# Patient Record
Sex: Male | Born: 1950
Health system: Southern US, Community
[De-identification: ages and names within clinical notes are randomized; demographics above are authoritative.]

## PROBLEM LIST (undated history)

## (undated) DIAGNOSIS — E785 Hyperlipidemia, unspecified: Secondary | ICD-10-CM

## (undated) DIAGNOSIS — I771 Stricture of artery: Secondary | ICD-10-CM

## (undated) DIAGNOSIS — T7840XA Allergy, unspecified, initial encounter: Secondary | ICD-10-CM

## (undated) DIAGNOSIS — I739 Peripheral vascular disease, unspecified: Secondary | ICD-10-CM

## (undated) DIAGNOSIS — M1990 Unspecified osteoarthritis, unspecified site: Secondary | ICD-10-CM

## (undated) DIAGNOSIS — I779 Disorder of arteries and arterioles, unspecified: Secondary | ICD-10-CM

## (undated) DIAGNOSIS — Z87891 Personal history of nicotine dependence: Secondary | ICD-10-CM

## (undated) DIAGNOSIS — I10 Essential (primary) hypertension: Secondary | ICD-10-CM

## (undated) HISTORY — DX: Allergy, unspecified, initial encounter: T78.40XA

## (undated) HISTORY — DX: Hyperlipidemia, unspecified: E78.5

## (undated) HISTORY — DX: Stricture of artery: I77.1

## (undated) HISTORY — PX: WISDOM TOOTH EXTRACTION: SHX21

## (undated) HISTORY — PX: JOINT REPLACEMENT: SHX530

## (undated) HISTORY — DX: Unspecified osteoarthritis, unspecified site: M19.90

## (undated) HISTORY — DX: Personal history of nicotine dependence: Z87.891

## (undated) HISTORY — DX: Essential (primary) hypertension: I10

## (undated) HISTORY — DX: Disorder of arteries and arterioles, unspecified: I77.9

## (undated) HISTORY — DX: Peripheral vascular disease, unspecified: I73.9

---

## 2005-10-21 HISTORY — PX: TOTAL HIP ARTHROPLASTY: SHX124

## 2005-11-07 ENCOUNTER — Inpatient Hospital Stay (HOSPITAL_COMMUNITY): Admission: RE | Admit: 2005-11-07 | Discharge: 2005-11-10 | Payer: Self-pay | Admitting: Orthopedic Surgery

## 2007-04-24 DIAGNOSIS — Z5189 Encounter for other specified aftercare: Secondary | ICD-10-CM

## 2007-04-24 HISTORY — DX: Encounter for other specified aftercare: Z51.89

## 2008-04-23 DIAGNOSIS — I10 Essential (primary) hypertension: Secondary | ICD-10-CM

## 2008-04-23 HISTORY — DX: Essential (primary) hypertension: I10

## 2013-03-11 ENCOUNTER — Encounter (INDEPENDENT_AMBULATORY_CARE_PROVIDER_SITE_OTHER): Payer: Self-pay

## 2013-03-11 ENCOUNTER — Ambulatory Visit (INDEPENDENT_AMBULATORY_CARE_PROVIDER_SITE_OTHER): Payer: Managed Care, Other (non HMO) | Admitting: Family Medicine

## 2013-03-11 VITALS — BP 99/68 | HR 80 | Temp 98.0°F | Ht 70.0 in | Wt 190.0 lb

## 2013-03-11 DIAGNOSIS — Z0000 Encounter for general adult medical examination without abnormal findings: Secondary | ICD-10-CM

## 2013-03-11 LAB — POCT CBC
Granulocyte percent: 59.1 %G (ref 37–80)
HCT, POC: 53.4 % (ref 43.5–53.7)
Lymph, poc: 2.2 (ref 0.6–3.4)
MPV: 8.2 fL (ref 0–99.8)
POC LYMPH PERCENT: 38.4 %L (ref 10–50)
Platelet Count, POC: 268 10*3/uL (ref 142–424)
RDW, POC: 13.3 %

## 2013-03-12 LAB — TSH: TSH: 1.05 u[IU]/mL (ref 0.450–4.500)

## 2013-03-12 LAB — CMP14+EGFR
ALT: 15 IU/L (ref 0–44)
Alkaline Phosphatase: 84 IU/L (ref 39–117)
BUN/Creatinine Ratio: 13 (ref 10–22)
BUN: 11 mg/dL (ref 8–27)
CO2: 28 mmol/L (ref 18–29)
Calcium: 10.2 mg/dL (ref 8.6–10.2)
Creatinine, Ser: 0.88 mg/dL (ref 0.76–1.27)
Glucose: 100 mg/dL — ABNORMAL HIGH (ref 65–99)
Total Bilirubin: 0.5 mg/dL (ref 0.0–1.2)

## 2013-03-12 LAB — LIPID PANEL
Chol/HDL Ratio: 5.8 ratio units — ABNORMAL HIGH (ref 0.0–5.0)
Cholesterol, Total: 238 mg/dL — ABNORMAL HIGH (ref 100–199)
HDL: 41 mg/dL (ref >39)
Triglycerides: 190 mg/dL — ABNORMAL HIGH (ref 0–149)
VLDL Cholesterol Cal: 38 mg/dL (ref 5–40)

## 2013-03-12 LAB — PSA, TOTAL AND FREE
PSA, Free Pct: 26.4 %
PSA: 1.1 ng/mL (ref 0.0–4.0)

## 2013-03-12 NOTE — Progress Notes (Signed)
  Subjective:    Patient ID: Jesus Klein, male    DOB: 1950-08-13, 62 y.o.   MRN: [id number]  HPI  This 62 y.o. male presents for evaluation of CPE and follow up on his hypertension. He is taking diovan 160mg  po qd.  His bp has been in the 90's.  Review of Systems    No chest pain, SOB, HA, dizziness, vision change, N/V, diarrhea, constipation, dysuria, urinary urgency or frequency, myalgias, arthralgias or rash.  Objective:   Physical Exam Vital signs noted  Well developed well nourished male.  HEENT - Head atraumatic Normocephalic                Eyes - PERRLA, Conjuctiva - clear Sclera- Clear EOMI                Ears - EAC's Wnl TM's Wnl Gross Hearing WNL                Nose - Nares patent                 Throat - oropharanx wnl Respiratory - Lungs CTA bilateral Cardiac - RRR S1 and S2 without murmur GI - Abdomen soft Nontender and bowel sounds active x 4 Extremities - No edema. Neuro - Grossly intact.       Assessment & Plan:  Routine general medical examination at a health care facility - Plan: CMP14+EGFR, TSH, Lipid panel, Vit D  25 hydroxy (rtn osteoporosis monitoring), PSA, total and free, POCT CBC  HTN - DC diovan and start taking lisinopril 5mg  po qd #90/2rf  Deatra Canter FNP

## 2013-03-16 ENCOUNTER — Other Ambulatory Visit: Payer: Self-pay | Admitting: Family Medicine

## 2013-03-25 ENCOUNTER — Telehealth: Payer: Self-pay | Admitting: *Deleted

## 2013-03-25 NOTE — Telephone Encounter (Signed)
Lm to call back

## 2013-04-02 NOTE — Telephone Encounter (Signed)
Pt aware of labs  

## 2013-04-02 NOTE — Telephone Encounter (Signed)
Lm to call back-jhb

## 2013-11-16 ENCOUNTER — Other Ambulatory Visit: Payer: Self-pay | Admitting: Family Medicine

## 2014-01-19 ENCOUNTER — Encounter: Payer: Self-pay | Admitting: Family Medicine

## 2014-02-02 ENCOUNTER — Encounter: Payer: Self-pay | Admitting: Physician Assistant

## 2014-02-02 ENCOUNTER — Ambulatory Visit (INDEPENDENT_AMBULATORY_CARE_PROVIDER_SITE_OTHER): Payer: Managed Care, Other (non HMO) | Admitting: Physician Assistant

## 2014-02-02 VITALS — BP 160/78 | HR 76 | Temp 97.6°F | Resp 18 | Ht 70.0 in | Wt 186.0 lb

## 2014-02-02 DIAGNOSIS — Z23 Encounter for immunization: Secondary | ICD-10-CM

## 2014-02-02 DIAGNOSIS — E559 Vitamin D deficiency, unspecified: Secondary | ICD-10-CM | POA: Insufficient documentation

## 2014-02-02 DIAGNOSIS — I10 Essential (primary) hypertension: Secondary | ICD-10-CM

## 2014-02-02 DIAGNOSIS — R0989 Other specified symptoms and signs involving the circulatory and respiratory systems: Secondary | ICD-10-CM | POA: Insufficient documentation

## 2014-02-02 DIAGNOSIS — E785 Hyperlipidemia, unspecified: Secondary | ICD-10-CM

## 2014-02-02 DIAGNOSIS — I739 Peripheral vascular disease, unspecified: Secondary | ICD-10-CM

## 2014-02-02 DIAGNOSIS — Z1212 Encounter for screening for malignant neoplasm of rectum: Secondary | ICD-10-CM

## 2014-02-02 DIAGNOSIS — Z1211 Encounter for screening for malignant neoplasm of colon: Secondary | ICD-10-CM

## 2014-02-02 LAB — COMPLETE METABOLIC PANEL WITHOUT GFR
ALT: 17 U/L (ref 0–53)
AST: 18 U/L (ref 0–37)
Albumin: 4.7 g/dL (ref 3.5–5.2)
Alkaline Phosphatase: 82 U/L (ref 39–117)
BUN: 14 mg/dL (ref 6–23)
CO2: 29 meq/L (ref 19–32)
Calcium: 10.2 mg/dL (ref 8.4–10.5)
Chloride: 103 meq/L (ref 96–112)
Creat: 0.84 mg/dL (ref 0.50–1.35)
GFR, Est African American: >89 mL/min
GFR, Est Non African American: >89 mL/min
Glucose, Bld: 85 mg/dL (ref 70–99)
Potassium: 4.6 meq/L (ref 3.5–5.3)
Sodium: 139 meq/L (ref 135–145)
Total Bilirubin: 0.6 mg/dL (ref 0.2–1.2)
Total Protein: 7.2 g/dL (ref 6.0–8.3)

## 2014-02-02 LAB — LIPID PANEL
Cholesterol: 209 mg/dL — ABNORMAL HIGH (ref 0–200)
HDL: 41 mg/dL
LDL Cholesterol: 110 mg/dL — ABNORMAL HIGH (ref 0–99)
Total CHOL/HDL Ratio: 5.1 ratio
Triglycerides: 291 mg/dL — ABNORMAL HIGH
VLDL: 58 mg/dL — ABNORMAL HIGH (ref 0–40)

## 2014-02-02 MED ORDER — LISINOPRIL 10 MG PO TABS: 10.0000 mg | ORAL_TABLET | Freq: Every day | ORAL | Status: DC

## 2014-02-02 NOTE — Progress Notes (Signed)
Patient ID: Jesus Klein MRN: [id number], DOB: November 21, 1950 63 y.o. Date of Encounter: 02/02/2014, 10:19 AM    Chief Complaint: Physical (CPE)  HPI: 63 y.o. y/o male here for CPE.  He presents as a new patient to establish care with this office.  He reports that he did live in [place] but recently moved to [place]. He did go to [place] but is changing to this office secondary to location.  The only complaint that he reports today is the following: He states that when he walks a distance,  his legs start to feel weak and feel like they are cramping. Says he starts to feel a cramping sensation in his low back and the backs of his thighs in the backs of his calves. When he stops and rests, the symptoms resolve but then with further walking, the symptoms quickly recur. He says that the symptoms are equal on both sides. Says that these symptoms have been persistent for the past 6 months. Says it only occurs when he does constant walking. Says it has gotten to where he can only walk on his treadmill for about 2 minutes before he starts having these problems.  Says the only problems/ evaluation  he has ever had with low back pain/sciatica was in 2010. Says at that time he saw [facility] and they checked on his hip to make sure that his symptoms were not secondary to his hip replacement surgery and that they determined that that episode was "sciatica."  Reviewed in Epic today that there are no x-rays of his lumbar spine or other x-rays there.  Note: Patient works 3 PM to 3 AM.  Says he usually goes home and goes to sleep and then is awake by 10:00 a.m.   He reports no other problems or concerns today. He initially came in today thinking he was going to do a " complete physical."  However I discussed with him that it appears that he was coded as a complete physical at his visit 03/11/2013. Discussed that insurance usually will only pay for 1 complete  physical exam per year. He voices understanding and agrees that I will discuss this is her regular visit. He also discussed the fact that he did have a panel of lab work 03/11/13. Discussed that usually insurance will only pay for screening labs once per year. Therefore today we will only repeat the labs that are needed/indicated to repeat. Patient voices understanding and agrees.   Review of Systems: Consitutional: No fever, chills, fatigue, night sweats, lymphadenopathy, or weight changes. Eyes: No visual changes, eye redness, or discharge. ENT/Mouth: Ears: No otalgia, tinnitus, hearing loss, discharge. Nose: No congestion, rhinorrhea, sinus pain, or epistaxis. Throat: No sore throat, post nasal drip, or teeth pain. Cardiovascular: No CP, palpitations, diaphoresis, DOE, edema, orthopnea, PND. Respiratory: No cough, hemoptysis, SOB, or wheezing. Gastrointestinal: No anorexia, dysphagia, reflux, pain, nausea, vomiting, hematemesis, diarrhea, constipation, BRBPR, or melena. Genitourinary: No dysuria, frequency, urgency, hematuria, incontinence, nocturia, decreased urinary stream, discharge, impotence, or testicular pain/masses. Musculoskeletal: No decreased ROM, myalgias, stiffness, joint swelling. See HPI for pertinent positives.  Skin: No rash, erythema, lesion changes, pain, warmth, jaundice, or pruritis. Neurological: No headache, dizziness, syncope, seizures, tremors, memory loss, coordination problems, or paresthesias. Psychological: No anxiety, depression, hallucinations, SI/HI. Endocrine: No fatigue, polydipsia, polyphagia, polyuria, or known diabetes. All other systems were reviewed and are otherwise negative.  Past Medical History  Diagnosis Date  . Hypertension 2010  . Arthritis 2005  Past Surgical History  Procedure Laterality Date  . Joint replacement Right 10/2005    total hip    Home Meds:  Outpatient Prescriptions Prior to Visit  Medication Sig Dispense Refill  .  cetirizine (ZYRTEC) 10 MG tablet Take 10 mg by mouth daily.      . cholecalciferol (VITAMIN D) 1000 UNITS tablet Take 1,000 Units by mouth daily.      . Coenzyme Q10 (EQL COQ10) 300 MG CAPS Take 1 capsule by mouth daily.      Marland Kitchen ibuprofen (ADVIL,MOTRIN) 200 MG tablet Take 200 mg by mouth as needed.      . Multiple Vitamin (MULTIVITAMIN) tablet Take 1 tablet by mouth daily.      . naproxen (NAPROSYN) 250 MG tablet Take by mouth as needed.      . Omega-3 Fatty Acids (FISH OIL PO) Take 1,400 mg by mouth daily. Takes two a day      . lisinopril (PRINIVIL,ZESTRIL) 5 MG tablet TAKE 1 TABLET DAILY  90 tablet  0   No facility-administered medications prior to visit.    Allergies: No Known Allergies  History   Social History  . Marital Status: Married    Spouse Name: N/A    Number of Children: N/A  . Years of Education: N/A   Occupational History  . Not on file.   Social History Main Topics  . Smoking status: Former Smoker    Types: Cigarettes    Quit date: 01/19/2009  . Smokeless tobacco: Never Used  . Alcohol Use: 1.2 - 1.8 oz/week    2-3 Cans of beer per week  . Drug Use: No  . Sexual Activity: Yes    Birth Control/ Protection: None   Other Topics Concern  . 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 He may occasionally lift 20 pounds but most is done by machine.    Mix of standing, sitting, moving around.      Used to walk a lot and do a lot outside. Got a treadmill to use during cold weather.   Married.   2 daughters- grown.          Family History  Problem Relation Age of Onset  . Hyperlipidemia Mother   . Arthritis Father   . COPD Father   . Diabetes Father   . Hypertension Father     Physical Exam: Blood pressure 160/78, pulse 76, temperature 97.6 F (36.4 C), temperature source Oral, resp. rate 18, height 5\' 10"  (1.778 m), weight 186 lb (84.369 kg), SpO2 98.00%.    General: Well developed, well nourished,White Male. Appears in no acute distress. Neck: Supple. Trachea midline. No thyromegaly. Full ROM. No lymphadenopathy. He has bilateral carotid bruits left greater than right. Lungs: Clear to auscultation bilaterally without wheezes, rales, or rhonchi. Breathing is of normal effort and unlabored. Cardiovascular: RRR with S1 S2. No murmurs, rubs, or gallops.  VASCULAR:  He has bilateral carotid bruits, Left > Right.  He has a right femoral bruit but no left femoral bruit. He has very soft right renal artery bruit but no left renal artery bruit.  He has no palpable dorsalis pedis or posterior tibial pulses bilaterally. There are no wounds or open sores on his feet. Color is normal.  Abdomen: Soft, non-tender, non-distended with normoactive bowel sounds. No hepatosplenomegaly or masses. No rebound/guarding. No CVA tenderness. No hernias. Musculoskeletal: Full range  of motion and 5/5 strength throughout. Skin: Warm and moist without erythema, ecchymosis, wounds, or rash. Neuro: A+Ox3. CN II-XII grossly intact. Moves all extremities spontaneously. Full sensation throughout. Normal gait.  Psych:  Responds to questions appropriately with a normal affect.   Assessment/Plan:  63 y.o. y/o white male here for Routine Office Visit to Establish Care.  1. Essential hypertension At the end of the visit when I was reviewing his AVS, I discussed with patient that I was increasing his blood pressure medicine.  He then asked what blood pressure reading we got and I told him 160/78.  He says that he checks his blood pressure twice a day and gets about 135/75. I explained that we were only increasing it a small amount and that he is on a very tiny dose right now and will still be on a small dose. He is agreeable to go ahead and increase from lisinopril 5 mg a 10 mg. Told him to followup if he develops any lightheadedness.  - COMPLETE METABOLIC PANEL WITH GFR -  lisinopril (PRINIVIL,ZESTRIL) 10 MG tablet; Take 1 tablet (10 mg total) by mouth daily.  Dispense: 90 tablet; Refill: 3  2. I hear a soft bruit at the right renal artery. Given that I am hearing bruits elsewhere, will go ahead and obtain renal artery ultrasound. - US Renal Artery Stenosis; Future  3. Vitamin D deficiency 02/2013 vitamin D level was low at 23.9. Patient states that since that time he has been taking over-the-counter vitamin D 1000 units daily as directed. Will recheck a vitamin D level now on supplement. - Vit D  25 hydroxy (rtn osteoporosis monitoring)  4. Hyperlipidemia 03/11/2013 he had lipid panel which showed triglycerides 190,    HDL 41,     LDL 159. Patient states that since then he has increased his fruits and vegetables and did increase his Fish Oil from taking 1 per day up to 2 per day. Will recheck lipid panel now. - COMPLETE METABOLIC PANEL WITH GFR - Lipid panel  5. Claudication of lower extremity - Ankle brachial index; Future - Lower Extremity Arterial Doppler Bilateral; Future  6. Bilateral carotid bruits - Carotid duplex; Future  7. Preventive Care  A. Screening Labs: 03/11/13 he had:   (these are in Epic)  CBC--Normal CMET---Normal TSH--Normal PSA----Normal Vitamin D--low as above FLP--as above  B. Screening For Prostate Cancer: PSA--normal 03/11/2013  C. Screening For Colorectal Cancer:  Patient reports he has had one colonoscopy. Says that was at age 45. He states that this was performed at [facility] in [place] but does not recall the name of the Doctor. I discussed that even if that colonoscopy was normal, that he should have repeat every 10 years. He is agreeable for me to schedule followup.  - Ambulatory referral to Gastroenterology  D. Immunizations: Influenza Vaccine: Pt is agreeable to get the flu vaccine today. Need for prophylactic vaccination and inoculation against influenza - Flu Vaccine QUAD 36+ mos PF IM (Fluarix  Quad PF)  Tetanus------ This is documented in Epic. He received Tdap 09/02/2010 Pneumococcal--- he has no indication to need pneumonia vaccine until age 63. Zostavax-----we'll discuss this as is next office visit.  He is to schedule followup office visit in about 3 weeks.    Signed:   [address] [place], [place]  02/02/2014 10:19 AM

## 2014-02-03 ENCOUNTER — Telehealth (HOSPITAL_COMMUNITY): Payer: Self-pay | Admitting: *Deleted

## 2014-02-03 ENCOUNTER — Telehealth: Payer: Self-pay | Admitting: *Deleted

## 2014-02-03 LAB — VITAMIN D 25 HYDROXY (VIT D DEFICIENCY, FRACTURES): VIT D 25 HYDROXY: 43 ng/mL (ref 30–89)

## 2014-02-03 NOTE — Telephone Encounter (Signed)
LMTRC to pt, pt has appt at [facility] on Oct 21 at 10:45, needs to arrive 7mins early for check in for US renal artery, pt is to have no SOLIDS starting at 12pm the day before, and then NPO 12 midnight before his scan

## 2014-02-04 NOTE — Telephone Encounter (Signed)
Pt called back and is aware of appt

## 2014-02-09 ENCOUNTER — Other Ambulatory Visit: Payer: Self-pay | Admitting: Physician Assistant

## 2014-02-09 DIAGNOSIS — I10 Essential (primary) hypertension: Secondary | ICD-10-CM

## 2014-02-10 ENCOUNTER — Other Ambulatory Visit: Payer: Self-pay

## 2014-02-12 ENCOUNTER — Ambulatory Visit (HOSPITAL_COMMUNITY)
Admission: RE | Admit: 2014-02-12 | Discharge: 2014-02-12 | Disposition: A | Discharge status: Home / Self Care | Payer: Managed Care, Other (non HMO) | Source: Ambulatory Visit | Attending: Cardiovascular Disease | Admitting: Cardiovascular Disease

## 2014-02-12 ENCOUNTER — Ambulatory Visit (INDEPENDENT_AMBULATORY_CARE_PROVIDER_SITE_OTHER): Payer: Managed Care, Other (non HMO) | Admitting: Cardiovascular Disease

## 2014-02-12 ENCOUNTER — Inpatient Hospital Stay (HOSPITAL_COMMUNITY): Admission: RE | Admit: 2014-02-12 | Payer: Self-pay | Source: Ambulatory Visit

## 2014-02-12 ENCOUNTER — Encounter: Payer: Self-pay | Admitting: Cardiovascular Disease

## 2014-02-12 ENCOUNTER — Ambulatory Visit (HOSPITAL_BASED_OUTPATIENT_CLINIC_OR_DEPARTMENT_OTHER)
Admission: RE | Admit: 2014-02-12 | Discharge: 2014-02-12 | Disposition: A | Discharge status: Home / Self Care | Payer: Managed Care, Other (non HMO) | Source: Ambulatory Visit | Attending: Physician Assistant | Admitting: Physician Assistant

## 2014-02-12 VITALS — BP 111/74 | HR 86 | Ht 70.0 in | Wt 186.2 lb

## 2014-02-12 DIAGNOSIS — I739 Peripheral vascular disease, unspecified: Secondary | ICD-10-CM

## 2014-02-12 DIAGNOSIS — R0989 Other specified symptoms and signs involving the circulatory and respiratory systems: Secondary | ICD-10-CM

## 2014-02-12 DIAGNOSIS — Z87891 Personal history of nicotine dependence: Secondary | ICD-10-CM

## 2014-02-12 DIAGNOSIS — R5383 Other fatigue: Secondary | ICD-10-CM

## 2014-02-12 DIAGNOSIS — Z79899 Other long term (current) drug therapy: Secondary | ICD-10-CM

## 2014-02-12 DIAGNOSIS — D689 Coagulation defect, unspecified: Secondary | ICD-10-CM

## 2014-02-12 MED ORDER — ATORVASTATIN CALCIUM 40 MG PO TABS: 40.0000 mg | ORAL_TABLET | Freq: Every day | ORAL | Status: DC

## 2014-02-12 MED ORDER — ASPIRIN EC 81 MG PO TBEC: 81.0000 mg | DELAYED_RELEASE_TABLET | Freq: Every day | ORAL | Status: DC

## 2014-02-12 NOTE — Assessment & Plan Note (Signed)
Recent lower shimmy Dopplers for further office today revealed an occluded left common iliac with high-grade right iliac artery stenosis. Based on this, I decided to proceed with angiography and potential percutaneous intervention. Procedure benefits have been thoroughly explained to the patient including alternatives and he agrees to proceed.

## 2014-02-12 NOTE — Progress Notes (Signed)
02/12/2014 Jesus Klein   01-25-1951  [id number]  Primary Physician Karis Juba, PA-C Primary Cardiologist: Lorretta Harp MD Renae Gloss   HPI:  [name] is a 63 year old thin-appearing Caucasian male father of 2 children, grandfather and 2 grandchildren referred by Karis Juba PA-C for peripheral vascular evaluation. He had Dopplers performed in the office today that showed high-grade ostial carotid, ostial/subclavian artery stenosis as well as an occluded left common iliac and high-grade right iliac stenosis. His cardiovascular risk factor profile is notable for a 30-pack-year history of tobacco abuse having stopped smoking 2 years ago as well as 2 hypertension. There is no family history of heart disease. He does have mild hyperlipidemia. He has never had a heart attack or stroke. Denies chest pain or shortness of breath. He did have a right total hip replacement performed by Dr. Sharol Given in 2007. He noticed lifestyle limiting claudication 6 months ago right greater than left.   Current Outpatient Prescriptions  Medication Sig Dispense Refill  . cetirizine (ZYRTEC) 10 MG tablet Take 10 mg by mouth daily.      . cholecalciferol (VITAMIN D) 1000 UNITS tablet Take 1,000 Units by mouth daily.      . Coenzyme Q10 (EQL COQ10) 300 MG CAPS Take 1 capsule by mouth daily.      Marland Kitchen ibuprofen (ADVIL,MOTRIN) 200 MG tablet Take 200 mg by mouth as needed.      Marland Kitchen lisinopril (PRINIVIL,ZESTRIL) 10 MG tablet Take 1 tablet (10 mg total) by mouth daily.  90 tablet  3  . Multiple Vitamin (MULTIVITAMIN) tablet Take 1 tablet by mouth daily.      . naproxen (NAPROSYN) 250 MG tablet Take by mouth as needed.      . Omega-3 Fatty Acids (FISH OIL PO) Take 1,400 mg by mouth daily. Takes two a day       No current facility-administered medications for this visit.    No Known Allergies  History   Social History  . Marital Status: Married    Spouse Name: N/A    Number of Children: N/A  .  Years of Education: N/A   Occupational History  . Not on file.   Social History Main Topics  . Smoking status: Former Smoker    Types: Cigarettes    Quit date: 01/19/2009  . Smokeless tobacco: Never Used  . Alcohol Use: 1.2 - 1.8 oz/week    2-3 Cans of beer per week  . Drug Use: No  . Sexual Activity: Yes    Birth Control/ Protection: None   Other Topics Concern  . 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 He may occasionally lift 20 pounds but most is done by machine.    Mix of standing, sitting, moving around.      Used to walk a lot and do a lot outside. Got a treadmill to use during cold weather.   Married.   2 daughters- grown.           Review of Systems: General: negative for chills, fever, night sweats or weight changes.  Cardiovascular: negative for chest pain, dyspnea on exertion, edema, orthopnea, palpitations, paroxysmal nocturnal dyspnea or shortness of breath Dermatological: negative for rash Respiratory: negative for cough or wheezing Urologic: negative for hematuria Abdominal: negative for nausea, vomiting, diarrhea, bright red blood per rectum, melena, or hematemesis Neurologic: negative for visual  changes, syncope, or dizziness All other systems reviewed and are otherwise negative except as noted above.    Blood pressure 111/74, pulse 86, height 5\' 10"  (1.778 m), weight 186 lb 3.2 oz (84.46 kg).  General appearance: alert and no distress Neck: no adenopathy, no JVD, supple, symmetrical, trachea midline, thyroid not enlarged, symmetric, no tenderness/mass/nodules and bilateral carotid bruits, without left subclavian bruit Lungs: clear to auscultation bilaterally Heart: regular rate and rhythm, S1, S2 normal, no murmur, click, rub or gallop Extremities: extremities normal, atraumatic, no cyanosis or edema and absent left common femoral pulse,  EKG not  performed today  ASSESSMENT AND PLAN:   Hyperlipidemia Recent lipid profile performed 02/02/14 revealed Flagyl 209 with an LDL of 110 and HDL of 41. Based on the diffuse nature of his vascular disease and then to begin atorvastatin 40 mg a day.  Essential hypertension Controlled on current medications  Claudication of lower extremity Recent lower shimmy Dopplers for further office today revealed an occluded left common iliac with high-grade right iliac artery stenosis. Based on this, I decided to proceed with angiography and potential percutaneous intervention. Procedure benefits have been thoroughly explained to the patient including alternatives and he agrees to proceed.      Lorretta Harp MD FACP,FACC,FAHA, [facility] 02/12/2014 4:04 PM

## 2014-02-12 NOTE — Patient Instructions (Signed)
Dr. Berry has ordered a peripheral angiogram to be done at [facility].  This procedure is going to look at the bloodflow in your lower extremities.  If Dr. Berry is able to open up the arteries, you will have to spend one night in the hospital.  If he is not able to open the arteries, you will be able to go home that same day.    After the procedure, you will not be allowed to drive for 3 days or push, pull, or lift anything greater than 10 lbs for one week.    You will be required to have the following tests prior to the procedure:  1. Blood work-the blood work can be done no more than 7 days prior to the procedure.  It can be done at any [facility] lab.  There is one downstairs on the first floor of this building and one in the [facility] ([address] [facility])  2. Chest Xray-the chest xray order has already been placed at the [facility].     *REPS n/a    

## 2014-02-12 NOTE — Progress Notes (Signed)
Carotid Duplex Completed. Jimy Gates, BS, RDMS, RVT  

## 2014-02-12 NOTE — Assessment & Plan Note (Signed)
Recent lipid profile performed 02/02/14 revealed Flagyl 209 with an LDL of 110 and HDL of 41. Based on the diffuse nature of his vascular disease and then to begin atorvastatin 40 mg a day.

## 2014-02-12 NOTE — Progress Notes (Signed)
Arterial Duplex Lower Ext. Completed. Shrika Milos, BS, RDMS, RVT  

## 2014-02-12 NOTE — Assessment & Plan Note (Signed)
Controlled on current medications 

## 2014-02-18 ENCOUNTER — Ambulatory Visit
Admission: RE | Admit: 2014-02-18 | Discharge: 2014-02-18 | Disposition: A | Discharge status: Home / Self Care | Payer: Managed Care, Other (non HMO) | Source: Ambulatory Visit | Attending: Physician Assistant | Admitting: Physician Assistant

## 2014-02-18 DIAGNOSIS — I10 Essential (primary) hypertension: Secondary | ICD-10-CM

## 2014-02-24 ENCOUNTER — Ambulatory Visit (INDEPENDENT_AMBULATORY_CARE_PROVIDER_SITE_OTHER): Payer: Managed Care, Other (non HMO) | Admitting: Physician Assistant

## 2014-02-24 ENCOUNTER — Encounter: Payer: Self-pay | Admitting: Physician Assistant

## 2014-02-24 VITALS — BP 132/76 | HR 72 | Temp 98.2°F | Resp 18 | Wt 184.0 lb

## 2014-02-24 DIAGNOSIS — Z1211 Encounter for screening for malignant neoplasm of colon: Secondary | ICD-10-CM

## 2014-02-24 DIAGNOSIS — R0989 Other specified symptoms and signs involving the circulatory and respiratory systems: Secondary | ICD-10-CM

## 2014-02-24 DIAGNOSIS — E785 Hyperlipidemia, unspecified: Secondary | ICD-10-CM

## 2014-02-24 DIAGNOSIS — E559 Vitamin D deficiency, unspecified: Secondary | ICD-10-CM

## 2014-02-24 DIAGNOSIS — Z1212 Encounter for screening for malignant neoplasm of rectum: Secondary | ICD-10-CM

## 2014-02-24 DIAGNOSIS — I739 Peripheral vascular disease, unspecified: Secondary | ICD-10-CM

## 2014-02-24 DIAGNOSIS — I10 Essential (primary) hypertension: Secondary | ICD-10-CM

## 2014-02-24 NOTE — Progress Notes (Signed)
Patient ID: Jesus Klein MRN: [id number], DOB: 1950/10/18 63 y.o. Date of Encounter: 02/24/2014, 9:51 AM    Chief Complaint: F/U HTN, PVD  HPI: 63 y.o. y/o male here for follow-up of above.  He recently saw me on 02/02/2014 as a new patient to establish care with this office.  He reported that he did live in [place] but recently moved to [place]. He did go to [place] but was changing to this office secondary to location.  The only complaint that he reported that day was the following: " He states that when he walks a distance,  his legs start to feel weak and feel like they are cramping. Says he starts to feel a cramping sensation in his low back and the backs of his thighs in the backs of his calves. When he stops and rests, the symptoms resolve but then with further walking, the symptoms quickly recur. He says that the symptoms are equal on both sides. Says that these symptoms have been persistent for the past 6 months. Says it only occurs when he does constant walking. Says it has gotten to where he can only walk on his treadmill for about 2 minutes before he starts having these problems.  Says the only problems/ evaluation  he has ever had with low back pain/sciatica was in 2010. Says at that time he saw [facility] and they checked on his hip to make sure that his symptoms were not secondary to his hip replacement surgery and that they determined that that episode was "sciatica." '  Reviewed in Epic today that there are no x-rays of his lumbar spine or other x-rays there.  Note: Patient works 3 PM to 3 AM.  Says he usually goes home and goes to sleep and then is awake by 10:00 a.m.   He reported no other problems or concerns that day. He initially came in today thinking he was going to do a " complete physical."  However I discussed with him that it appears that he was coded as a complete physical at his visit 03/11/2013. Discussed that  insurance usually will only pay for 1 complete physical exam per year. He voices understanding and agrees that I will code this as a routine office visit. He also discussed the fact that he did have a panel of lab work 03/11/13. Discussed that usually insurance will only pay for screening labs once per year. Therefore today we will only repeat the labs that are needed/indicated to repeat. Patient voices understanding and agrees.  At that visit I did go ahead and repeat any lab work that was indicated given his current medical problems. At that visit also evaluated him for lower extremity claudication and found multiple bruits on exam. Ordered appropriate arterial Dopplers. I also updated preventative care at that visit. See Assessment/Plan section  at the end of this note for the details of this.   Review of Systems: Consitutional: No fever, chills, fatigue, night sweats, lymphadenopathy, or weight changes. Eyes: No visual changes, eye redness, or discharge. ENT/Mouth: Ears: No otalgia, tinnitus, hearing loss, discharge. Nose: No congestion, rhinorrhea, sinus pain, or epistaxis. Throat: No sore throat, post nasal drip, or teeth pain. Cardiovascular: No CP, palpitations, diaphoresis, DOE, edema, orthopnea, PND. Respiratory: No cough, hemoptysis, SOB, or wheezing. Gastrointestinal: No anorexia, dysphagia, reflux, pain, nausea, vomiting, hematemesis, diarrhea, constipation, BRBPR, or melena. Genitourinary: No dysuria, frequency, urgency, hematuria, incontinence, nocturia, decreased urinary stream, discharge, impotence, or testicular pain/masses. Musculoskeletal: No  decreased ROM, myalgias, stiffness, joint swelling. See HPI for pertinent positives.  Skin: No rash, erythema, lesion changes, pain, warmth, jaundice, or pruritis. Neurological: No headache, dizziness, syncope, seizures, tremors, memory loss, coordination problems, or paresthesias. Psychological: No anxiety, depression, hallucinations,  SI/HI. Endocrine: No fatigue, polydipsia, polyphagia, polyuria, or known diabetes. All other systems were reviewed and are otherwise negative.  Past Medical History  Diagnosis Date  . Hypertension 2010  . Arthritis 2005  . Hyperlipidemia   . Peripheral arterial disease      Past Surgical History  Procedure Laterality Date  . Joint replacement Right 10/2005    total hip    Home Meds:  Outpatient Prescriptions Prior to Visit  Medication Sig Dispense Refill  . aspirin EC 81 MG tablet Take 1 tablet (81 mg total) by mouth daily. 90 tablet 3  . atorvastatin (LIPITOR) 40 MG tablet Take 1 tablet (40 mg total) by mouth daily. 30 tablet 6  . cetirizine (ZYRTEC) 10 MG tablet Take 10 mg by mouth daily.    . cholecalciferol (VITAMIN D) 1000 UNITS tablet Take 1,000 Units by mouth daily.    . Coenzyme Q10 (EQL COQ10) 300 MG CAPS Take 1 capsule by mouth daily.    Marland Kitchen ibuprofen (ADVIL,MOTRIN) 200 MG tablet Take 200 mg by mouth as needed.    Marland Kitchen lisinopril (PRINIVIL,ZESTRIL) 10 MG tablet Take 1 tablet (10 mg total) by mouth daily. 90 tablet 3  . Multiple Vitamin (MULTIVITAMIN) tablet Take 1 tablet by mouth daily.    . naproxen sodium (ALEVE) 220 MG tablet Take 220 mg by mouth every 12 (twelve) hours.    . Omega-3 Fatty Acids (FISH OIL PO) Take 1,400 mg by mouth daily. Takes two a day    . naproxen (NAPROSYN) 250 MG tablet Take by mouth as needed.    . Omega-3 1000 MG CAPS Take 2,000 mg by mouth daily.     No facility-administered medications prior to visit.    Allergies: No Known Allergies  History   Social History  . Marital Status: Married    Spouse Name: N/A    Number of Children: N/A  . Years of Education: N/A   Occupational History  . Not on file.   Social History Main Topics  . Smoking status: Former Smoker    Types: Cigarettes    Quit date: 01/19/2009  . Smokeless tobacco: Never Used  . Alcohol Use: 1.2 - 1.8 oz/week    2-3 Cans of beer per week  . Drug Use: No  . Sexual  Activity: Yes    Birth Control/ Protection: None   Other Topics Concern  . 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 He may occasionally lift 20 pounds but most is done by machine.    Mix of standing, sitting, moving around.      Used to walk a lot and do a lot outside. Got a treadmill to use during cold weather.   Married.   2 daughters- grown.          Family History  Problem Relation Age of Onset  . Hyperlipidemia Mother   . Arthritis Father   . COPD Father   . Diabetes Father   . Hypertension Father     Physical Exam: Blood pressure 132/76, pulse 72, temperature 98.2 F (36.8 C), temperature source Oral, resp. rate 18, weight 184 lb (83.462 kg).  General:  Well developed, well nourished,White Male. Appears in no acute distress. Neck: Supple. Trachea midline. No thyromegaly. Full ROM. No lymphadenopathy. He has bilateral carotid bruits left greater than right. Lungs: Clear to auscultation bilaterally without wheezes, rales, or rhonchi. Breathing is of normal effort and unlabored. Cardiovascular: RRR with S1 S2. No murmurs, rubs, or gallops.  VASCULAR:  He has bilateral carotid bruits, Left > Right.  He has a right femoral bruit but no left femoral bruit. He has very soft right renal artery bruit but no left renal artery bruit.  He has no palpable dorsalis pedis or posterior tibial pulses bilaterally. There are no wounds or open sores on his feet. Color is normal.  Abdomen: Soft, non-tender, non-distended with normoactive bowel sounds. No hepatosplenomegaly or masses. No rebound/guarding. No CVA tenderness. No hernias. Musculoskeletal: Full range of motion and 5/5 strength throughout. Skin: Warm and moist without erythema, ecchymosis, wounds, or rash. Neuro: A+Ox3. CN II-XII grossly intact. Moves all extremities spontaneously. Full sensation throughout. Normal gait.  Psych:   Responds to questions appropriately with a normal affect.   Assessment/Plan:  63 y.o. y/o white male here for Routine Office Visit to Establish Care.  1. Essential hypertension At LOV increased from lisinopril 5 mg to 10 mg.  Today he reports that he is taking the 10 mg as directed. He is having no lightheadedness and no other adverse effects. Pressure is now at goal so we will will continue current dose. I will not repeat BMET today as Dr. Gwenlyn Found already has BMET ordered as part of pre-procedure labs. Patient states that he is going this Friday for these labs.  2. On exam 02/02/2014 I heard a soft bruit at the right renal artery. Given that I am hearing bruits elsewhere, I went ahead and obtained renal artery ultrasound. - US Renal Artery Stenosis; Future---this was performed 02/12/14 and showed no significant renal artery stenosis.  3. Vitamin D deficiency 02/2013 vitamin D level was low at 23.9. Patient states that since that time he has been taking over-the-counter vitamin D 1000 units daily as directed. Vitamin D level rechecked 02/02/14-- on supplement.  4. Hyperlipidemia 03/11/2013 he had lipid panel which showed triglycerides 190,    HDL 41,     LDL 159. At [place] with me 02/02/2014 Patient stated that since then he had increased his fruits and vegetables and did increase his Fish Oil from taking 1 per day up to 2 per day. Rechecked lipid panel at that OV. When patient saw Dr. Gwenlyn Found on 02/12/14 he did review that lipid panel and went ahead and started patient on Lipitor 40 mg.   5. Claudication of lower extremity - Ankle brachial index; Future - Lower Extremity Arterial Doppler Bilateral; Future  These tests were ordered 02/02/14. He did have these performed on 02/12/14. Patient states that he "was lucky" and it just so happened that Dr. Gwenlyn Found had an appointment available to see him immediately after he had the Dopplers performed that day. He was found to have an occluded left  common iliac and a high-grade right common iliac. Patient states that he is scheduled for PV angiogram with possible intervention next week with Dr. Gwenlyn Found.  6. Bilateral carotid bruits - Carotid duplex; Future---this was also performed on 02/12/14. This showed--left common carotid artery/ICA/ECA-- proximal occlusive versus high-grade stenosis. Right vertebral appear occluded Left subclavian artery--proximal high-grade stenosis  Patient states that he thinks that Dr. Estill Cotta he is planning to do the lower extremity angiogram this upcoming week. Patient states  that he is under the understanding that Dr. Gwenlyn Found is going to wait to address these areas of stenosis.  7. Preventive Care  A. Screening Labs: 03/11/13 he had:   (these are in Epic)  CBC--Normal CMET---Normal TSH--Normal PSA----Normal Vitamin D--low as above FLP--as above  B. Screening For Prostate Cancer: PSA--normal 03/11/2013  C. Screening For Colorectal Cancer:  Patient reports he has had one colonoscopy. Says that was at age 14. He states that this was performed at [facility] in [place] but does not recall the name of the Doctor. I discussed that even if that colonoscopy was normal, that he should have repeat every 10 years. He is agreeable for me to schedule followup.  - Ambulatory referral to Gastroenterology   D. Immunizations: Influenza Vaccine: Pt is agreeable to get the flu vaccine today. Need for prophylactic vaccination and inoculation against influenza - Flu Vaccine QUAD 36+ mos PF IM (Fluarix Quad PF)  Tetanus------ This is documented in Epic. He received Tdap 09/02/2010 Pneumococcal--- he has no indication to need pneumonia vaccine until age 35. Zostavax-----At OV 02/24/2014--I discusssed--he is to call his insurance to find out the cost. If he is agreeable to pay this cough and he will call us and we will send an order to the pharmacy.  He wait 6 months for routine follow-up office visit. Follow-up sooner  if needed.    Signed:   [address] [place], [place]  02/24/2014 9:51 AM

## 2014-02-26 ENCOUNTER — Ambulatory Visit
Admission: RE | Admit: 2014-02-26 | Discharge: 2014-02-26 | Disposition: A | Discharge status: Home / Self Care | Payer: Managed Care, Other (non HMO) | Source: Ambulatory Visit | Attending: Cardiovascular Disease | Admitting: Cardiovascular Disease

## 2014-02-26 DIAGNOSIS — Z87891 Personal history of nicotine dependence: Secondary | ICD-10-CM

## 2014-02-26 LAB — CBC
HCT: 47.4 % (ref 39.0–52.0)
Hemoglobin: 16.3 g/dL (ref 13.0–17.0)
MCH: 30.6 pg (ref 26.0–34.0)
MCHC: 34.4 g/dL (ref 30.0–36.0)
MCV: 88.9 fL (ref 78.0–100.0)
PLATELETS: 280 10*3/uL (ref 150–400)
RBC: 5.33 MIL/uL (ref 4.22–5.81)
RDW: 13.2 % (ref 11.5–15.5)
WBC: 5.8 10*3/uL (ref 4.0–10.5)

## 2014-02-26 LAB — BASIC METABOLIC PANEL
BUN: 13 mg/dL (ref 6–23)
CO2: 26 meq/L (ref 19–32)
Calcium: 9.7 mg/dL (ref 8.4–10.5)
Chloride: 101 mEq/L (ref 96–112)
Creat: 0.89 mg/dL (ref 0.50–1.35)
GLUCOSE: 91 mg/dL (ref 70–99)
Potassium: 4.3 mEq/L (ref 3.5–5.3)
SODIUM: 140 meq/L (ref 135–145)

## 2014-02-26 LAB — TSH: TSH: 1.2 u[IU]/mL (ref 0.350–4.500)

## 2014-02-27 LAB — APTT: APTT: 32 s (ref 24–37)

## 2014-02-27 LAB — PROTIME-INR
INR: 1.04 (ref <1.50)
Prothrombin Time: 13.6 seconds (ref 11.6–15.2)

## 2014-03-04 ENCOUNTER — Encounter (HOSPITAL_COMMUNITY): Payer: Self-pay | Admitting: *Deleted

## 2014-03-04 ENCOUNTER — Encounter (HOSPITAL_COMMUNITY)
Admission: RE | Disposition: A | Discharge status: Home / Self Care | Payer: Self-pay | Source: Ambulatory Visit | Attending: Cardiovascular Disease

## 2014-03-04 ENCOUNTER — Ambulatory Visit (HOSPITAL_COMMUNITY)
Admission: RE | Admit: 2014-03-04 | Discharge: 2014-03-05 | Disposition: A | Discharge status: Home / Self Care | Payer: Managed Care, Other (non HMO) | Source: Ambulatory Visit | Attending: Cardiovascular Disease | Admitting: Cardiovascular Disease

## 2014-03-04 DIAGNOSIS — I739 Peripheral vascular disease, unspecified: Secondary | ICD-10-CM | POA: Diagnosis present

## 2014-03-04 DIAGNOSIS — I708 Atherosclerosis of other arteries: Secondary | ICD-10-CM | POA: Diagnosis not present

## 2014-03-04 DIAGNOSIS — D689 Coagulation defect, unspecified: Secondary | ICD-10-CM

## 2014-03-04 DIAGNOSIS — I6522 Occlusion and stenosis of left carotid artery: Secondary | ICD-10-CM

## 2014-03-04 DIAGNOSIS — I7092 Chronic total occlusion of artery of the extremities: Secondary | ICD-10-CM | POA: Insufficient documentation

## 2014-03-04 DIAGNOSIS — I10 Essential (primary) hypertension: Secondary | ICD-10-CM | POA: Diagnosis not present

## 2014-03-04 DIAGNOSIS — Z87891 Personal history of nicotine dependence: Secondary | ICD-10-CM | POA: Insufficient documentation

## 2014-03-04 DIAGNOSIS — Z9862 Peripheral vascular angioplasty status: Secondary | ICD-10-CM

## 2014-03-04 DIAGNOSIS — Z79899 Other long term (current) drug therapy: Secondary | ICD-10-CM

## 2014-03-04 DIAGNOSIS — R5383 Other fatigue: Secondary | ICD-10-CM

## 2014-03-04 DIAGNOSIS — E785 Hyperlipidemia, unspecified: Secondary | ICD-10-CM | POA: Diagnosis not present

## 2014-03-04 DIAGNOSIS — I70211 Atherosclerosis of native arteries of extremities with intermittent claudication, right leg: Secondary | ICD-10-CM | POA: Diagnosis not present

## 2014-03-04 DIAGNOSIS — I70212 Atherosclerosis of native arteries of extremities with intermittent claudication, left leg: Secondary | ICD-10-CM

## 2014-03-04 HISTORY — PX: LOWER EXTREMITY ANGIOGRAM: SHX5508

## 2014-03-04 HISTORY — PX: ILIAC ARTERY STENT: SHX1786

## 2014-03-04 LAB — POCT ACTIVATED CLOTTING TIME
ACTIVATED CLOTTING TIME: 191 s
Activated Clotting Time: 208 seconds
Activated Clotting Time: 219 seconds
Activated Clotting Time: 152 seconds

## 2014-03-04 SURGERY — ANGIOGRAM, LOWER EXTREMITY
Anesthesia: LOCAL | Laterality: Right

## 2014-03-04 MED ORDER — ACETAMINOPHEN 325 MG PO TABS: 650.0000 mg | ORAL_TABLET | ORAL | Status: DC | PRN

## 2014-03-04 MED ORDER — ASPIRIN 81 MG PO CHEW
81.0000 mg | CHEWABLE_TABLET | ORAL | Status: AC
  Administered 2014-03-04: 81 mg via ORAL

## 2014-03-04 MED ORDER — ASPIRIN 81 MG PO CHEW
CHEWABLE_TABLET | ORAL | Status: AC
Start: 2014-03-04 — End: 2014-03-04
  Filled 2014-03-04: qty 1

## 2014-03-04 MED ORDER — LIDOCAINE HCL (PF) 1 % IJ SOLN
INTRAMUSCULAR | Status: AC
  Filled 2014-03-04: qty 30

## 2014-03-04 MED ORDER — SODIUM CHLORIDE 0.9 % IV SOLN
INTRAVENOUS | Status: AC
  Administered 2014-03-04: 16:00:00 via INTRAVENOUS

## 2014-03-04 MED ORDER — HEPARIN SODIUM (PORCINE) 1000 UNIT/ML IJ SOLN
INTRAMUSCULAR | Status: AC
  Filled 2014-03-04: qty 1

## 2014-03-04 MED ORDER — MORPHINE SULFATE 2 MG/ML IJ SOLN: 2.0000 mg | INTRAMUSCULAR | Status: DC | PRN

## 2014-03-04 MED ORDER — NITROGLYCERIN 0.2 MG/ML ON CALL CATH LAB
INTRAVENOUS | Status: AC
  Filled 2014-03-04: qty 1

## 2014-03-04 MED ORDER — ATORVASTATIN CALCIUM 40 MG PO TABS
40.0000 mg | ORAL_TABLET | Freq: Every day | ORAL | Status: DC
  Administered 2014-03-04: 40 mg via ORAL
  Filled 2014-03-04 (×2): qty 1

## 2014-03-04 MED ORDER — ASPIRIN EC 81 MG PO TBEC: 81.0000 mg | DELAYED_RELEASE_TABLET | Freq: Every day | ORAL | Status: DC

## 2014-03-04 MED ORDER — SODIUM CHLORIDE 0.9 % IV SOLN
INTRAVENOUS | Status: DC
  Administered 2014-03-04: 75 mL/h via INTRAVENOUS

## 2014-03-04 MED ORDER — CLOPIDOGREL BISULFATE 75 MG PO TABS
75.0000 mg | ORAL_TABLET | Freq: Every day | ORAL | Status: DC
  Administered 2014-03-05: 10:00:00 75 mg via ORAL
  Filled 2014-03-04: qty 1

## 2014-03-04 MED ORDER — HEPARIN (PORCINE) IN NACL 2-0.9 UNIT/ML-% IJ SOLN
INTRAMUSCULAR | Status: AC
  Filled 2014-03-04: qty 1000

## 2014-03-04 MED ORDER — LISINOPRIL 10 MG PO TABS
10.0000 mg | ORAL_TABLET | Freq: Every day | ORAL | Status: DC
  Administered 2014-03-05: 10 mg via ORAL
  Filled 2014-03-04: qty 1

## 2014-03-04 MED ORDER — ASPIRIN EC 325 MG PO TBEC
325.0000 mg | DELAYED_RELEASE_TABLET | Freq: Every day | ORAL | Status: DC
  Administered 2014-03-05: 325 mg via ORAL
  Filled 2014-03-04: qty 1

## 2014-03-04 MED ORDER — ONDANSETRON HCL 4 MG/2ML IJ SOLN: 4.0000 mg | Freq: Four times a day (QID) | INTRAMUSCULAR | Status: DC | PRN

## 2014-03-04 MED ORDER — CLOPIDOGREL BISULFATE 300 MG PO TABS
ORAL_TABLET | ORAL | Status: AC
Start: 2014-03-04 — End: 2014-03-04
  Filled 2014-03-04: qty 1

## 2014-03-04 MED ORDER — FENTANYL CITRATE 0.05 MG/ML IJ SOLN
INTRAMUSCULAR | Status: AC
  Filled 2014-03-04: qty 2

## 2014-03-04 MED ORDER — HEPARIN (PORCINE) IN NACL 2-0.9 UNIT/ML-% IJ SOLN
INTRAMUSCULAR | Status: AC
  Filled 2014-03-04: qty 500

## 2014-03-04 MED ORDER — SODIUM CHLORIDE 0.9 % IJ SOLN: 3.0000 mL | INTRAMUSCULAR | Status: DC | PRN

## 2014-03-04 MED ORDER — HYDRALAZINE HCL 20 MG/ML IJ SOLN: 10.0000 mg | INTRAMUSCULAR | Status: DC | PRN

## 2014-03-04 NOTE — Care Management Note (Signed)
    Page 1 of 1   03/04/2014     4:22:38 PM CARE MANAGEMENT NOTE 03/04/2014  Patient:  Jesus Klein, Jesus Klein   Account Number:  [id number]  Date Initiated:  03/04/2014  Documentation initiated by:  Jeanne Terrance  Subjective/Objective Assessment:   PV Angiogram/Intervention 03/04/2014     Action/Plan:   CM to follow for disposition needs   Anticipated DC Date:  03/05/2014   Anticipated DC Plan:  HOME/SELF CARE         Choice offered to / List presented to:             Status of service:  Completed, signed off Medicare Important Message given?   (If response is "NO", the following Medicare IM given date fields will be blank) Date Medicare IM given:   Medicare IM given by:   Date Additional Medicare IM given:   Additional Medicare IM given by:    Discharge Disposition:  HOME/SELF CARE  Per UR Regulation:    If discussed at Long Length of Stay Meetings, dates discussed:    Comments:  Mahnoor Mathisen RN, BSN, MSHL, CCM  Nurse - Case Manager,  (Unit [phone]  03/04/2014 Specialty med review:  Plavix Dispo Plan:  Home / Self care.

## 2014-03-04 NOTE — CV Procedure (Signed)
Jesus Klein is a 63 y.o. male    [id number] LOCATION:  FACILITY: [facility]  PHYSICIAN: Quay Burow, M.D. [birth year]   DATE OF PROCEDURE:  03/04/2014  DATE OF DISCHARGE:     PV Angiogram/Intervention    History obtained from chart review.Mr. Creamer is a 63 year old thin-appearing Caucasian male father of 2 children, grandfather and 2 grandchildren referred by Karis Juba PA-C for peripheral vascular evaluation. He had Dopplers performed in the office today that showed high-grade ostial carotid, ostial/subclavian artery stenosis as well as an occluded left common iliac and high-grade right iliac stenosis. His cardiovascular risk factor profile is notable for a 30-pack-year history of tobacco abuse having stopped smoking 2 years ago as well as 2 hypertension. There is no family history of heart disease. He does have mild hyperlipidemia. He has never had a heart attack or stroke. Denies chest pain or shortness of breath. He did have a right total hip replacement performed by Dr. Sharol Given in 2007. He noticed lifestyle limiting claudication 6 months ago right greater than left.   PROCEDURE DESCRIPTION:   The patient was brought to the second floor [facility] lab in the postabsorptive state. He was premedicated with fentanyl IV. His right groinwas prepped and shaved in usual sterile fashion. Xylocaine 1% was used for local anesthesia. A 5 French sheath was inserted into the right common femoral artery using standard Seldinger technique. The patient received  5000 units  of heparin  intravenously.  A 5 French pigtail catheter is used to perform aortic arch angiography, mid stream abdominal aortography, distal abdominal aortography, bilateral iliac angiography with bifemoral runoff. A 5 [place] JB 1 was used to perform selective right innominate angiography, left common carotid angiography and left subclavian angiography.Visipaque dye was used for the entirety of the case. Retrograde aortic  pressure was monitored during the case.    HEMODYNAMICS:    AO SYSTOLIC/AO DIASTOLIC: 409/81   Angiographic Data:   1: Arch aortogram-type I arch, 90% ostial left common carotid artery stenosis, 70-80% proximal left subclavian artery stenosis. Selective angiography of the right innominate artery and delayed imaging showed no evidence of retrograde vertebral filling  2: Abdominal aortogram-renal arteries were widely patent, intrarenal abdominal aorta was free of significant atherosclerotic changes  3: Left lower extremity-occluded left common iliac artery with reconstitution at the level of the internal/external bifurcation. The SFA and popliteal appeared widely patent and there was three-vessel runoff  4: Right lower extremity-60% hypodense ostial/proximal right common iliac artery stenosis with a 40 mm pullback gradient after administration of 200 mg of intra-arterial nitroglycerin via the SideArm sheath. There was a 90% right external iliac artery stenosis and a 90% right common femoral artery stenosis.  IMPRESSION:Mr. Mannis has high-grade left common carotid artery stenosis at the origin as well as high-grade left subclavian artery stenosis. His left common iliac artery is totally occluded with reconstitution distally. He has hemodynamic with significant ostial/proximal right common iliac artery stenosis as well as high-grade right external and common femoral artery stenosis. The plan today will be to intervene on the ostium of the right common iliac artery as well as the right external and common femoral arteries.  Procedure Description:the patient received 12,000 units of heparin intravenously with an ending ACT of 200. He received 300 mg of by mouth Plavix at the end of the case. The right external and common femoral arteries were initially predilated with a 4 mm x 60 mm balloon. Following this stenting was performed with an  8 mm x 100 mm long Abbott absolute Pro nitinol self expanding  stent. This entire segment was postdilated with a 7 mm x 4 cm balloon. Following this stenting was performed of the origin of the right common iliac artery using an 8 mm x 24 mm long balloon stent premounted deployed at 8-10 atm and postdilated with a 9 mm by 2 cm  balloon resulting in reduction of 60% hypodense hemodynamically significant lesion to 0% residual. Completion abdominal aortography was performed with a pigtail catheter.  Final Impression: successful right common iliac artery stenting as well as right external iliac and common femoral artery stenting. The patient will be treated with dual antiplatelet therapy. He will be hydrated overnight and discharged home in the morning. We will get follow-up arterial Doppler studies in our notified office next week and I will see him back in 2-3 weeks for follow-up. At that time we will discuss potential endovascular treatment of his left common iliac artery chronic total occlusion and discuss further his left common carotid and subclavian arteries.    Lorretta Harp MD, [facility] 03/04/2014 3:24 PM

## 2014-03-04 NOTE — Progress Notes (Signed)
Site area: right groin  Site Prior to Removal:  Level 0  Pressure Applied For 20 MINUTES    Minutes Beginning at 1725  Manual:   Yes.    Patient Status During Pull:  AAO x4   Post Pull Groin Site:  Level 0  Post Pull Instructions Given:  Yes.    Post Pull Pulses Present:  Yes.    Dressing Applied:  Yes.    Comments:  Tolerated procedure well

## 2014-03-04 NOTE — Plan of Care (Signed)
Problem: Phase II Progression Outcomes Goal: Pain controlled with appropriate interventions Outcome: Completed/Met Date Met:  03/04/14 Goal: Hemodynamically stable Outcome: Completed/Met Date Met:  03/04/14 Goal: Tolerates diet Outcome: Completed/Met Date Met:  03/04/14 Goal: Vascular site scale level 0 - I Vascular Site Scale Level 0: No bruising/bleeding/hematoma Level I (Mild): Bruising/Ecchymosis, minimal bleeding/ooozing, palpable hematoma < 3 cm Level II (Moderate): Bleeding not affecting hemodynamic parameters, pseudoaneurysm, palpable hematoma > 3 cm Level III (Severe) Bleeding which affects hemodynamic parameters or retroperitoneal hemorrhage  Outcome: Completed/Met Date Met:  03/04/14 Goal: Other Discharge Outcomes/Goals Outcome: Not Applicable Date Met:  76/15/18

## 2014-03-05 ENCOUNTER — Other Ambulatory Visit: Payer: Self-pay | Admitting: Cardiology

## 2014-03-05 ENCOUNTER — Encounter (HOSPITAL_COMMUNITY): Payer: Self-pay | Admitting: Cardiology

## 2014-03-05 DIAGNOSIS — I739 Peripheral vascular disease, unspecified: Secondary | ICD-10-CM

## 2014-03-05 DIAGNOSIS — I70211 Atherosclerosis of native arteries of extremities with intermittent claudication, right leg: Secondary | ICD-10-CM | POA: Diagnosis not present

## 2014-03-05 DIAGNOSIS — Z9862 Peripheral vascular angioplasty status: Secondary | ICD-10-CM

## 2014-03-05 LAB — CBC
HEMATOCRIT: 47.7 % (ref 39.0–52.0)
HEMOGLOBIN: 15.7 g/dL (ref 13.0–17.0)
MCH: 30.3 pg (ref 26.0–34.0)
MCHC: 32.9 g/dL (ref 30.0–36.0)
MCV: 92.1 fL (ref 78.0–100.0)
Platelets: 255 10*3/uL (ref 150–400)
RBC: 5.18 MIL/uL (ref 4.22–5.81)
RDW: 12.6 % (ref 11.5–15.5)
WBC: 7 10*3/uL (ref 4.0–10.5)

## 2014-03-05 LAB — BASIC METABOLIC PANEL
Anion gap: 13 (ref 5–15)
BUN: 14 mg/dL (ref 6–23)
CO2: 25 meq/L (ref 19–32)
Calcium: 9.1 mg/dL (ref 8.4–10.5)
Chloride: 104 mEq/L (ref 96–112)
Creatinine, Ser: 0.84 mg/dL (ref 0.50–1.35)
GFR calc Af Amer: >90 mL/min (ref >90)
GFR calc non Af Amer: >90 mL/min (ref >90)
GLUCOSE: 90 mg/dL (ref 70–99)
POTASSIUM: 4.2 meq/L (ref 3.7–5.3)
Sodium: 142 mEq/L (ref 137–147)

## 2014-03-05 MED ORDER — ACETAMINOPHEN 325 MG PO TABS: 650.0000 mg | ORAL_TABLET | ORAL | Status: DC | PRN

## 2014-03-05 MED ORDER — CLOPIDOGREL BISULFATE 75 MG PO TABS: 75.0000 mg | ORAL_TABLET | Freq: Every day | ORAL | Status: DC

## 2014-03-05 NOTE — Plan of Care (Signed)
Problem: Phase II Progression Outcomes Goal: Barriers To Progression Addressed/Resolved Outcome: Completed/Met Date Met:  03/05/14 Goal: Discharge plan in place and appropriate Outcome: Completed/Met Date Met:  03/05/14

## 2014-03-05 NOTE — Plan of Care (Signed)
Problem: Phase II Progression Outcomes Goal: Ambulates up to 600 ft. in hall x 1 Outcome: Completed/Met Date Met:  03/05/14

## 2014-03-05 NOTE — Plan of Care (Signed)
Problem: Phase II Progression Outcomes Goal: Complications resolved/controlled Outcome: Completed/Met Date Met:  03/05/14

## 2014-03-05 NOTE — Plan of Care (Signed)
Problem: Phase II Progression Outcomes Goal: Activity appropriate for discharge plan Outcome: Completed/Met Date Met:  03/05/14

## 2014-03-05 NOTE — Discharge Summary (Signed)
Patient ID: Jesus Klein,  MRN: [id number], DOB/AGE: 1950-04-26 63 y.o.  Admit date: 03/04/2014 Discharge date: 03/05/2014  Primary Care Provider: Karis Juba, PA-C Primary Cardiologist: Dr Gwenlyn Found  Discharge Diagnoses Principal Problem:   Claudication of lower extremity Active Problems:   PVD - Carotid, subclavian, bilat LE disease   S/P RCIA, RCFA, REIA PTA 03/04/14   Essential hypertension   Hyperlipidemia    Procedures: Peripheral angiogram and PTA to RCFA, REIA, RCIA 03/04/14   Hospital Course:  63 y/o male referred to Dr Gwenlyn Found by Karis Juba PA-C for peripheral vascular evaluation. He has a history of Rt/ Lt LE claudication. He had Dopplers performed in the office that showed high-grade ostial carotid, ostial/subclavian artery stenosis as well as an occluded left common iliac and high-grade right iliac stenosis. His cardiovascular risk factor profile is notable for a 30-pack-year history of tobacco abuse having stopped smoking 2 years ago as well as hyperlipidemia, and hypertension. On 03/04/14 he underwent successful right common iliac artery stenting as well as right external iliac and common femoral artery stenting. We feel he is stable for discharge 03/05/14.  Discharge Vitals:  Blood pressure 128/60, pulse 75, temperature 98.4 F (36.9 C), temperature source Oral, resp. rate 18, height 5\' 10"  (1.778 m), weight 187 lb 9.8 oz (85.1 kg), SpO2 96 %.    Labs: Results for orders placed or performed during the hospital encounter of 03/04/14 (from the past 24 hour(s))  POCT Activated clotting time     Status: None   Collection Time: 03/04/14  2:25 PM  Result Value Ref Range   Activated Clotting Time 191 seconds  POCT Activated clotting time     Status: None   Collection Time: 03/04/14  2:39 PM  Result Value Ref Range   Activated Clotting Time 219 seconds  POCT Activated clotting time     Status: None   Collection Time: 03/04/14  3:15 PM  Result Value Ref Range    Activated Clotting Time 208 seconds  POCT Activated clotting time     Status: None   Collection Time: 03/04/14  4:55 PM  Result Value Ref Range   Activated Clotting Time 152 seconds  Basic metabolic panel      Status: None   Collection Time: 03/05/14  3:35 AM  Result Value Ref Range   Sodium 142 137 - 147 mEq/L   Potassium 4.2 3.7 - 5.3 mEq/L   Chloride 104 96 - 112 mEq/L   CO2 25 19 - 32 mEq/L   Glucose, Bld 90 70 - 99 mg/dL   BUN 14 6 - 23 mg/dL   Creatinine, Ser 0.84 0.50 - 1.35 mg/dL   Calcium 9.1 8.4 - 10.5 mg/dL   GFR calc non Af Amer >90 >90 mL/min   GFR calc Af Amer >90 >90 mL/min   Anion gap 13 5 - 15  CBC     Status: None   Collection Time: 03/05/14  3:35 AM  Result Value Ref Range   WBC 7.0 4.0 - 10.5 K/uL   RBC 5.18 4.22 - 5.81 MIL/uL   Hemoglobin 15.7 13.0 - 17.0 g/dL   HCT 47.7 39.0 - 52.0 %   MCV 92.1 78.0 - 100.0 fL   MCH 30.3 26.0 - 34.0 pg   MCHC 32.9 30.0 - 36.0 g/dL   RDW 12.6 11.5 - 15.5 %   Platelets 255 150 - 400 K/uL    Disposition:      Follow-up Information  Follow up with Lorretta Harp, MD.   Specialty:  Cardiology   Why:  office will call you   Contact information:   [address] [place] [address] [phone]       Discharge Medications:    Medication List    ASK your doctor about these medications        ALEVE 220 MG tablet  Generic drug:  naproxen sodium  Take 220 mg by mouth 2 (two) times daily as needed (pain).     aspirin EC 81 MG tablet  Take 81 mg by mouth daily.     atorvastatin 40 MG tablet  Commonly known as:  LIPITOR  Take 1 tablet (40 mg total) by mouth daily.     cetirizine 10 MG tablet  Commonly known as:  ZYRTEC  Take 10 mg by mouth daily.     cholecalciferol 1000 UNITS tablet  Commonly known as:  VITAMIN D  Take 1,000 Units by mouth daily.     EQL COQ10 300 MG Caps  Generic drug:  Coenzyme Q10  Take 1 capsule by mouth daily.     ibuprofen 200 MG tablet  Commonly known  as:  ADVIL,MOTRIN  Take 200 mg by mouth as needed.     lisinopril 10 MG tablet  Commonly known as:  PRINIVIL,ZESTRIL  Take 1 tablet (10 mg total) by mouth daily.     multivitamin tablet  Take 1 tablet by mouth daily.     OMEGA 3 PO  Take 1,400 mg by mouth every other day.         Duration of Discharge Encounter: Greater than 30 minutes including physician time.  Angelena Form PA-C 03/05/2014 10:04 AM

## 2014-03-05 NOTE — Discharge Instructions (Signed)
Anticoagulation, Generic  Anticoagulants are medicines used to prevent clots from developing in your veins. These medicine are also known as blood thinners. If blood clots are untreated, they could travel to your lungs. This is called a pulmonary embolus. A blood clot in your lungs can be fatal.   Health care providers often use anticoagulants to prevent clots following surgery. Anticoagulants are also used along with aspirin when the heart is not getting enough blood.  Another anticoagulant called warfarin is started 2 to 3 days after a rapid-acting injectable anticoagulant is started. The rapid-acting anticoagulants are usually continued until warfarin has begun to work. Your health care provider will judge this length of time by blood tests known as the prothrombin time (PT) and International Normalization Ratio (INR). This means that your blood is at the necessary and best level to prevent clots.  RISKS AND COMPLICATIONS  · If you have received recent epidural anesthesia, spinal anesthesia, or a spinal tap while receiving anticoagulants, you are at risk for developing a blood clot in or around the spine. This condition could result in long-term or permanent paralysis.  · Because anticoagulants thin your blood, severe bleeding may occur from any tissue or organ. Symptoms of the blood being too thin may include:  ¨ Bleeding from the nose or gums that does not stop quickly.  ¨ Blood in bowel movements which may appear as bright red, dark, or black tarry stools.  ¨ Blood in the urine which may appear as pink, red, or brown urine.  ¨ Unusual bruising or bruising easily.  ¨ A cut that does not stop bleeding within 10 minutes.  ¨ Vomiting blood or continuous nausea for more than 1 day.  ¨ Coughing up blood.  ¨ Broken blood vessels in your eye (subconjunctival hemorrhage).  ¨ Abdominal or back pain with or without flank bruising.  ¨ Sudden, severe headache.  ¨ Sudden weakness or numbness of the face, arm, or leg,  especially on one side of the body.  ¨ Sudden confusion.  ¨ Trouble speaking (aphasia) or understanding.  ¨ Sudden trouble seeing in one or both eyes.  ¨ Sudden trouble walking.  ¨ Dizziness.  ¨ Loss of balance or coordination.  ¨ Vaginal bleeding.  ¨ Swelling or pain at an injection site.  ¨ Superficial fat tissue death (necrosis) which may cause skin scarring. This is more common in women and may first present as pain in the waist, thighs, or buttocks.  ¨ Fever.  · Too little anticoagulation continues to allow the risk for blood clots.  HOME CARE INSTRUCTIONS   · Due to the complications of anticoagulants, it is very important that you take your anticoagulant as directed by your health care provider. Anticoagulants need to be taken exactly as instructed. Be sure you understand all your anticoagulant instructions.  · Keep all follow-up appointments with your health care provider as directed. It is very important to keep your appointments. Not keeping appointments could result in a chronic or permanent injury, pain, or disability.  · Warfarin. Your health care provider will advise you on the length of treatment (usually 3-6 months, sometimes lifelong).  ¨ Take warfarin exactly as directed by your health care provider. It is recommended that you take your warfarin dose at the same time of the day. It is preferred that you take warfarin in the late afternoon. If you have been told to stop taking warfarin, do not resume taking warfarin until directed to do so by your health care   provider. Follow your health care provider's instructions if you accidentally take an extra dose or miss a dose of warfarin. It is very important to take warfarin as directed since bleeding or blood clots could result in chronic or permanent injury, pain, or disability.  ¨ Too much and too little warfarin are both dangerous. Too much warfarin increases the risk of bleeding. Too little warfarin continues to allow the risk for blood clots. While  taking warfarin, you will need to have regular blood tests to measure your blood clotting time. These blood tests usually include both the prothrombin time (PT) and International Normalized Ratio (INR) tests. The PT and INR results allow your health care provider to adjust your dose of warfarin. The dose can change for many reasons. It is critically important that you have your PT and INR levels drawn exactly as directed. Your warfarin dose may stay the same or change depending on what the PT and INR results are. Be sure to follow up with your health care provider regarding your PT and INR test results and what your warfarin dosage should be.  ¨ Many medicines can interfere with warfarin and affect the PT and INR results. You must tell your health care provider about any and all medicines you take, this includes all vitamins and supplements. Ask your health care provider before taking these. Prescription and over-the-counter medicine consistency is critical to warfarin management. It is important that potential interactions are checked before you start a new medicine. Be especially cautious with aspirin and anti-inflammatory medicines. Ask your health care provider before taking these. Medicines such as antibiotics and acid-reducing medicine can interact with warfarin and can cause an increased warfarin effect. Warfarin can also interfere with the effectiveness of medicines you are taking. Do not take or discontinue any prescribed or over-the-counter medicine except on the advice of your health care provider or pharmacist.  ¨ Some vitamins, supplements, and herbal products interfere with the effectiveness of warfarin. Vitamin E may increase the anticoagulant effects of warfarin. Vitamin K may can cause warfarin to be less effective. Do not take or discontinue any vitamin, supplement, or herbal product except on the advice of your health care provider or pharmacist.  ¨ Eat what you normally eat and keep the vitamin K  content of your diet consistent. Avoid major changes in your diet, or notify your health care provider before changing your diet. Suddenly getting a lot more vitamin K could cause your blood to clot too quickly. A sudden decrease in vitamin K intake could cause your blood to clot too slowly. These changes in vitamin K intake could lead to dangerous blood clots or to bleeding. To keep your vitamin K intake consistent, you must be aware of which foods contain moderate or high amounts of vitamin K. Some foods high in vitamin K include spinach, kale, broccoli, cabbage, greens, Brussels sprouts, asparagus, Bok [place]oy, coleslaw, parsley, and green tea. Arrange a visit with a dietitian to answer your questions.  ¨ If you have a loss of appetite or get the stomach flu (viral gastroenteritis), talk to your health care provider as soon as possible. A decrease in your normal vitamin K intake can make you more sensitive to your usual dose of warfarin.  ¨ Some medical conditions may increase your risk for bleeding while you are taking warfarin. A fever, diarrhea lasting more than a day, worsening heart failure, or worsening liver function are some medical conditions that could affect warfarin. Contact your health care provider if   you have any of these medical conditions.  ¨ Alcohol can change the body's ability to handle warfarin. It is best to avoid alcoholic drinks or consume only very small amounts while taking warfarin. Notify your health care provider if you change your alcohol intake. A sudden increase in alcohol use can increase your risk of bleeding. Chronic alcohol use can cause warfarin to be less effective.  · Be careful not to cut yourself when using sharp objects or while shaving.  · Inform all your health care providers and your dentist that you take an anticoagulant.  · Limit physical activities or sports that could result in a fall or cause injury. Avoid contact sports.  · Wear medical alert jewelry or carry a  medical alert card.  SEEK IMMEDIATE MEDICAL CARE IF:  · You cough up blood.  · You have dark or black stools or there is bright red blood coming from your rectum.  · You vomit blood or have nausea for more than 1 day.  · You have blood in the urine or pink colored urine.  · You have unusual bruising or have increased bruising.  · You have bleeding from the nose or gums that does not stop quickly.  · You have a cut that does not stop bleeding within a 2-3 minutes.  · You have sudden weakness or numbness of the face, arm, or leg, especially on one side of the body.  · You have sudden confusion.  · You have trouble speaking (aphasia) or understanding.  · You have sudden trouble seeing in one or both eyes.  · You have sudden trouble walking.  · You have dizziness.  · You have a loss of balance or coordination.  · You have a sudden, severe headache.  · You have a serious fall or head injury, even if you are not bleeding.  · You have swelling or pain at an injection site.  · You have unexplained tenderness or pain in the abdomen, back, waist, thighs or buttocks.  · You have a fever.  Any of these symptoms may represent a serious problem that is an emergency. Do not wait to see if the symptoms will go away. Get medical help right away. Call your local emergency services (911 in [place] Do not drive yourself to the hospital.  Document Released: 04/09/2005 Document Revised: 04/14/2013 Document Reviewed: 11/12/2007  Exit[employer]® Patient Information ©2015 [employer]. This information is not intended to replace advice given to you by your health care provider. Make sure you discuss any questions you have with your health care provider.

## 2014-03-05 NOTE — Progress Notes (Signed)
    Subjective:  No complaints   Objective:  Vital Signs in the last 24 hours: Temp:  [97.4 F (36.3 C)-98.4 F (36.9 C)] 98.4 F (36.9 C) (11/13 0416) Pulse Rate:  [65-123] 66 (11/13 0416) Resp:  [18] 18 (11/13 0416) BP: (121-175)/(53-73) 129/58 mmHg (11/13 0416) SpO2:  [92 %-99 %] 95 % (11/13 0416) Weight:  [185 lb (83.915 kg)-187 lb 9.8 oz (85.1 kg)] 187 lb 9.8 oz (85.1 kg) (11/13 0008)  Intake/Output from previous day:  Intake/Output Summary (Last 24 hours) at 03/05/14 0747 Last data filed at 03/05/14 0300  Gross per 24 hour  Intake   1065 ml  Output      0 ml  Net   1065 ml    Physical Exam: General appearance: alert, cooperative and no distress Lungs: clear to auscultation bilaterally Heart: regular rate and rhythm Extremities: Rt groin without hematoma   Rate: 68  Rhythm: normal sinus rhythm  Lab Results:  Recent Labs  03/05/14 0335  WBC 7.0  HGB 15.7  PLT 255    Recent Labs  03/05/14 0335  NA 142  K 4.2  CL 104  CO2 25  GLUCOSE 90  BUN 14  CREATININE 0.84   No results for input(s): TROPONINI in the last 72 hours.  Invalid input(s): CK, MB No results for input(s): INR in the last 72 hours.  Imaging: Imaging results have been reviewed  Cardiac Studies:  Assessment/Plan:  63 y/o male referred to Dr Gwenlyn Found by Karis Juba PA-C for peripheral vascular evaluation. He has a history of Rt/ Lt LE claudication. He had Dopplers performed in the office that showed high-grade ostial carotid, ostial/subclavian artery stenosis as well as an occluded left common iliac and high-grade right iliac stenosis. His cardiovascular risk factor profile is notable for a 30-pack-year history of tobacco abuse having stopped smoking 2 years ago as well as hyperlipidemia, and hypertension. On 03/04/14 he underwent successful right common iliac artery stenting as well as right external iliac and common femoral artery stenting.    Principal Problem:   Claudication of  lower extremity Active Problems:   PVD - Carotid, subclavian, bilat LE disease   S/P RCIA, RCFA, REIA PTA 03/04/14   Essential hypertension   Hyperlipidemia- (Lipitor just added)    PLAN: We will get follow-up arterial Doppler studies in our notified office next week and I will see him back in 2-3 weeks for follow-up. At that time we will discuss potential endovascular treatment of his left common iliac artery chronic total occlusion and discuss further his left common carotid and subclavian arteries.  Kerin Ransom PA-C Beeper [phone] 03/05/2014, 7:47 AM    Patient seen and examined. Agree with assessment and plan. S/P stenting to right common iliac, right external iliac and common femoral vessels. For DC today and plan for subsequent intervention as above.   Troy Sine, MD, [facility] 03/05/2014 9:31 AM

## 2014-03-09 ENCOUNTER — Telehealth: Payer: Self-pay | Admitting: Cardiovascular Disease

## 2014-03-11 ENCOUNTER — Encounter: Payer: Self-pay | Admitting: Physician Assistant

## 2014-03-15 ENCOUNTER — Ambulatory Visit (HOSPITAL_COMMUNITY)
Admission: RE | Admit: 2014-03-15 | Discharge: 2014-03-15 | Disposition: A | Discharge status: Home / Self Care | Payer: Managed Care, Other (non HMO) | Source: Ambulatory Visit | Attending: Cardiology | Admitting: Cardiology

## 2014-03-15 DIAGNOSIS — I739 Peripheral vascular disease, unspecified: Secondary | ICD-10-CM | POA: Insufficient documentation

## 2014-03-15 NOTE — Progress Notes (Signed)
Right Lower Ext. Arterial Duplex Completed. Newman Waren, BS, RDMS, RVT  

## 2014-03-16 NOTE — Telephone Encounter (Signed)
Closed encounter °

## 2014-03-31 ENCOUNTER — Encounter: Payer: Self-pay | Admitting: Cardiovascular Disease

## 2014-03-31 ENCOUNTER — Ambulatory Visit (INDEPENDENT_AMBULATORY_CARE_PROVIDER_SITE_OTHER): Payer: Managed Care, Other (non HMO) | Admitting: Cardiovascular Disease

## 2014-03-31 VITALS — BP 148/72 | HR 96 | Ht 70.0 in | Wt 187.7 lb

## 2014-03-31 DIAGNOSIS — Z01818 Encounter for other preprocedural examination: Secondary | ICD-10-CM

## 2014-03-31 DIAGNOSIS — R0989 Other specified symptoms and signs involving the circulatory and respiratory systems: Secondary | ICD-10-CM

## 2014-03-31 DIAGNOSIS — I739 Peripheral vascular disease, unspecified: Secondary | ICD-10-CM

## 2014-03-31 DIAGNOSIS — D689 Coagulation defect, unspecified: Secondary | ICD-10-CM

## 2014-03-31 MED ORDER — ATORVASTATIN CALCIUM 80 MG PO TABS: 80.0000 mg | ORAL_TABLET | Freq: Every day | ORAL | Status: DC

## 2014-03-31 NOTE — Assessment & Plan Note (Signed)
Angiography revealed a 95% ostial left common carotid artery stenosis at the origin of the aorta. He also had an 80% left subclavian artery stenosis denies left upper extremity claudication or subclavian steal symptoms. I believe that his left common carotid artery ostial stenosis should be stented. We talked about the procedure including the risks and benefits. I will plan on doing this sometime after the first liter with Dr. Trula Slade .

## 2014-03-31 NOTE — Patient Instructions (Addendum)
Dr. Gwenlyn Found has ordered a Peripheral Angiogram (carotid stent with Dr. Trula Slade; Right groin) to be done at [facility].  This procedure is going to look at the bloodflow in your lower extremities.  If Dr. Gwenlyn Found is able to open up the arteries, you will have to spend one night in the hospital.  If he is not able to open the arteries, you will be able to go home that same day.    After the procedure, you will not be allowed to drive for 3 days or push, pull, or lift anything greater than 10 lbs for one week.    You will be required to have the following tests prior to the procedure:  1. Blood work-the blood work can be done no more than 7 days prior to the procedure.  It can be done at any [facility] lab.  There is one downstairs on the first floor of this building and one in the [place] ([address])

## 2014-03-31 NOTE — Assessment & Plan Note (Signed)
History of hypertension with blood pressure measured today 140/72. He is on lisinopril 10 mg a day. Continue current meds at current dosing

## 2014-03-31 NOTE — Assessment & Plan Note (Signed)
History of bilateral lower extremity claudication. I recently performed angiography on him 03/04/14 revealing high-grade ostial right common iliac artery stenosis as well as tandem lesions in his right common femoral and external iliac artery all of which were stented. His ABIs improved from 0.65 on the right to 1.0. He does have an occluded left common iliac artery with a left ABI 0.59. Postintervention he has noticed complete resolution of his right lower extremity claudication as well as improvement on the left. He is on dual antiplatelet therapy.

## 2014-03-31 NOTE — Progress Notes (Signed)
   03/31/2014 Jesus Klein   12/22/1950  [id number]  Primary Physician DIXON,MARY BETH, PA-C Primary Cardiologist: Sakoya Win J. Pat Sires MD FACP,FACC,FAHA, FSCAI   HPI:  [name] is a 63-year-old thin-appearing Caucasian male father of 2 children, grandfather and 2 grandchildren referred by Mary Beth Dixon PA-C for peripheral vascular evaluation.I initially saw him in the office 02/12/14. He had Dopplers performed in the office today that showed high-grade ostial carotid, ostial/subclavian artery stenosis as well as an occluded left common iliac and high-grade right iliac stenosis. His cardiovascular risk factor profile is notable for a 30-pack-year history of tobacco abuse having stopped smoking 2 years ago as well as 2 hypertension. There is no family history of heart disease. He does have mild hyperlipidemia. He has never had a heart attack or stroke. Denies chest pain or shortness of breath. He did have a right total hip replacement performed by Dr. Duda in 2007. He noticed lifestyle limiting claudication 6 months ago right greater than left. On 03/04/14 I performed aortic arch angiography, selective left carotid angiography, abdominal aortography as well as right common iliac and external iliac intervention. His right lower extremity claudication has completely resolved. His ABI has normalized. He does not notice claudication as much in his left leg.   Current Outpatient Prescriptions  Medication Sig Dispense Refill  . acetaminophen (TYLENOL) 325 MG tablet Take 2 tablets (650 mg total) by mouth every 4 (four) hours as needed for headache or mild pain.    . aspirin EC 81 MG tablet Take 81 mg by mouth daily.    . atorvastatin (LIPITOR) 80 MG tablet Take 1 tablet (80 mg total) by mouth daily. 90 tablet 3  . cetirizine (ZYRTEC) 10 MG tablet Take 10 mg by mouth daily.    . cholecalciferol (VITAMIN D) 1000 UNITS tablet Take 1,000 Units by mouth daily.    . clopidogrel (PLAVIX) 75 MG tablet Take 1  tablet (75 mg total) by mouth daily with breakfast. 30 tablet 11  . Coenzyme Q10 (EQL COQ10) 300 MG CAPS Take 1 capsule by mouth daily.    . lisinopril (PRINIVIL,ZESTRIL) 10 MG tablet Take 1 tablet (10 mg total) by mouth daily. 90 tablet 3  . Multiple Vitamin (MULTIVITAMIN) tablet Take 1 tablet by mouth daily.    . naproxen sodium (ALEVE) 220 MG tablet Take 220 mg by mouth 2 (two) times daily as needed (pain).     . Omega-3 Fatty Acids (OMEGA 3 PO) Take 1,400 mg by mouth every other day.     No current facility-administered medications for this visit.    No Known Allergies  History   Social History  . Marital Status: Married    Spouse Name: N/A    Number of Children: N/A  . Years of Education: N/A   Occupational History  . Not on file.   Social History Main Topics  . Smoking status: Former Smoker -- 1.50 packs/day for 30 years    Types: Cigarettes    Quit date: 01/19/2009  . Smokeless tobacco: Never Used  . Alcohol Use: 1.2 oz/week    2 Cans of beer per week  . Drug Use: No  . Sexual Activity: Yes    Birth Control/ Protection: None   Other Topics Concern  . 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 He may occasionally lift 20 pounds but most is done by   machine.    Mix of standing, sitting, moving around.      Used to walk a lot and do a lot outside. Got a treadmill to use during cold weather.   Married.   2 daughters- grown.           Review of Systems: General: negative for chills, fever, night sweats or weight changes.  Cardiovascular: negative for chest pain, dyspnea on exertion, edema, orthopnea, palpitations, paroxysmal nocturnal dyspnea or shortness of breath Dermatological: negative for rash Respiratory: negative for cough or wheezing Urologic: negative for hematuria Abdominal: negative for nausea, vomiting, diarrhea, bright red blood per rectum, melena, or  hematemesis Neurologic: negative for visual changes, syncope, or dizziness All other systems reviewed and are otherwise negative except as noted above.    Blood pressure 148/72, pulse 96, height 5' 10" (1.778 m), weight 187 lb 11.2 oz (85.14 kg).  General appearance: alert and no distress Neck: no adenopathy, no JVD, supple, symmetrical, trachea midline, thyroid not enlarged, symmetric, no tenderness/mass/nodules and loud left carotid and subclavian bruit, soft right carotid bruit Lungs: clear to auscultation bilaterally Heart: regular rate and rhythm, S1, S2 normal, no murmur, click, rub or gallop Extremities: extremities normal, atraumatic, no cyanosis or edema and 2+ right femoral soft bruit  EKG not performed today  ASSESSMENT AND PLAN:   Essential hypertension History of hypertension with blood pressure measured today 140/72. He is on lisinopril 10 mg a day. Continue current meds at current dosing  Hyperlipidemia History of hyperlipidemia on atorvastatin 40 mg a day. His most recent lipid profile performed 02/02/14 revealed a total closure of 209, LDL of 110 and HDL of 41. We will increase his atorvastatin 80 mg a day  Claudication of lower extremity History of bilateral lower extremity claudication. I recently performed angiography on him 03/04/14 revealing high-grade ostial right common iliac artery stenosis as well as tandem lesions in his right common femoral and external iliac artery all of which were stented. His ABIs improved from 0.65 on the right to 1.0. He does have an occluded left common iliac artery with a left ABI 0.59. Postintervention he has noticed complete resolution of his right lower extremity claudication as well as improvement on the left. He is on dual antiplatelet therapy.  Bilateral carotid bruits Angiography revealed a 95% ostial left common carotid artery stenosis at the origin of the aorta. He also had an 80% left subclavian artery stenosis denies left  upper extremity claudication or subclavian steal symptoms. I believe that his left common carotid artery ostial stenosis should be stented. We talked about the procedure including the risks and benefits. I will plan on doing this sometime after the first liter with Dr. Brabham .      Schyler Butikofer J. Strummer Canipe MD FACP,FACC,FAHA, FSCAI 03/31/2014 4:53 PM  

## 2014-03-31 NOTE — Assessment & Plan Note (Signed)
History of hyperlipidemia on atorvastatin 40 mg a day. His most recent lipid profile performed 02/02/14 revealed a total closure of 209, LDL of 110 and HDL of 41. We will increase his atorvastatin 80 mg a day

## 2014-04-01 ENCOUNTER — Encounter (HOSPITAL_COMMUNITY): Payer: Self-pay | Admitting: Cardiovascular Disease

## 2014-04-01 ENCOUNTER — Other Ambulatory Visit: Payer: Self-pay | Admitting: *Deleted

## 2014-04-01 DIAGNOSIS — I6529 Occlusion and stenosis of unspecified carotid artery: Secondary | ICD-10-CM

## 2014-04-06 ENCOUNTER — Telehealth: Payer: Self-pay | Admitting: *Deleted

## 2014-04-06 ENCOUNTER — Other Ambulatory Visit: Payer: Self-pay | Admitting: *Deleted

## 2014-04-06 ENCOUNTER — Encounter: Payer: Self-pay | Admitting: Cardiovascular Disease

## 2014-04-06 DIAGNOSIS — I6529 Occlusion and stenosis of unspecified carotid artery: Secondary | ICD-10-CM

## 2014-04-06 NOTE — Progress Notes (Signed)
Orders for upcoming PV procedure with Dr. Gwenlyn Found signed and held.

## 2014-04-06 NOTE — Progress Notes (Signed)
Orders re-entered d/t needing a change in procedure consent.

## 2014-04-06 NOTE — Telephone Encounter (Signed)
Cancelled orders. Will re-enter in another encounter.

## 2014-04-07 ENCOUNTER — Telehealth: Payer: Self-pay | Admitting: Cardiovascular Disease

## 2014-04-07 NOTE — Telephone Encounter (Signed)
Left message for patient to call regarding procedure that is scheduled for 04/28/14 with Dr. Gwenlyn Found

## 2014-04-07 NOTE — Telephone Encounter (Signed)
Patient called back and stated he had read his appointment information in My Chart and will have lab work done on 04/22/14 or 1/4/6.

## 2014-04-20 ENCOUNTER — Telehealth: Payer: Self-pay | Admitting: Cardiology

## 2014-04-20 NOTE — Telephone Encounter (Signed)
Please ask for Option and Reference #-[id number] let them know you are calling back to let them know if you received a fax. This fax was for DNA testing for Plavix.

## 2014-04-20 NOTE — Telephone Encounter (Signed)
The Option should be 1,when you call this number please.

## 2014-04-22 NOTE — Telephone Encounter (Signed)
Fax received for DNA CYP2C19 testing auth.  Placed in Dr. Kennon Holter box.

## 2014-04-22 NOTE — Telephone Encounter (Signed)
Unable to locate fax. Called [facility] to request resend.

## 2014-04-26 ENCOUNTER — Telehealth: Payer: Self-pay | Admitting: Cardiovascular Disease

## 2014-04-26 LAB — PROTIME-INR
INR: 0.92 (ref <1.50)
PROTHROMBIN TIME: 12.4 s (ref 11.6–15.2)

## 2014-04-26 LAB — BASIC METABOLIC PANEL
BUN: 17 mg/dL (ref 6–23)
CHLORIDE: 103 meq/L (ref 96–112)
CO2: 28 mEq/L (ref 19–32)
Calcium: 10 mg/dL (ref 8.4–10.5)
Creat: 0.84 mg/dL (ref 0.50–1.35)
Glucose, Bld: 77 mg/dL (ref 70–99)
Potassium: 4.4 mEq/L (ref 3.5–5.3)
Sodium: 138 mEq/L (ref 135–145)

## 2014-04-26 LAB — CBC
HCT: 46.7 % (ref 39.0–52.0)
HEMOGLOBIN: 15.7 g/dL (ref 13.0–17.0)
MCH: 30.3 pg (ref 26.0–34.0)
MCHC: 33.6 g/dL (ref 30.0–36.0)
MCV: 90.2 fL (ref 78.0–100.0)
MPV: 9.1 fL — ABNORMAL LOW (ref 9.4–12.4)
PLATELETS: 307 10*3/uL (ref 150–400)
RBC: 5.18 MIL/uL (ref 4.22–5.81)
RDW: 13.3 % (ref 11.5–15.5)
WBC: 4.8 10*3/uL (ref 4.0–10.5)

## 2014-04-26 LAB — APTT: aPTT: 33 seconds (ref 24–37)

## 2014-04-26 NOTE — Telephone Encounter (Signed)
Sch carotid stent insertion 04/28/14.  Pt received preform letter requesting bloodwork and CXR so showed up to [facility]. They did not have order for CXR in system.  Dr. Gwenlyn Found advised bloodwork only at last OV. Recent CXR 02/25/14  Advised reception at [facility] pt did not need CXR, they directed patient to [facility] to have bloodwork drawn.

## 2014-04-28 ENCOUNTER — Ambulatory Visit (HOSPITAL_COMMUNITY)
Admission: RE | Admit: 2014-04-28 | Discharge: 2014-04-29 | Disposition: A | Discharge status: Home / Self Care | Payer: BLUE CROSS/BLUE SHIELD | Source: Ambulatory Visit | Attending: Cardiovascular Disease | Admitting: Cardiovascular Disease

## 2014-04-28 ENCOUNTER — Encounter (HOSPITAL_COMMUNITY): Payer: Self-pay | Admitting: Surgery

## 2014-04-28 ENCOUNTER — Encounter (HOSPITAL_COMMUNITY)
Admission: RE | Disposition: A | Discharge status: Home / Self Care | Payer: Managed Care, Other (non HMO) | Source: Ambulatory Visit | Attending: Cardiovascular Disease

## 2014-04-28 DIAGNOSIS — I6522 Occlusion and stenosis of left carotid artery: Secondary | ICD-10-CM | POA: Insufficient documentation

## 2014-04-28 DIAGNOSIS — I70203 Unspecified atherosclerosis of native arteries of extremities, bilateral legs: Secondary | ICD-10-CM | POA: Insufficient documentation

## 2014-04-28 DIAGNOSIS — I739 Peripheral vascular disease, unspecified: Secondary | ICD-10-CM | POA: Diagnosis present

## 2014-04-28 DIAGNOSIS — I6529 Occlusion and stenosis of unspecified carotid artery: Secondary | ICD-10-CM

## 2014-04-28 DIAGNOSIS — Z7982 Long term (current) use of aspirin: Secondary | ICD-10-CM | POA: Diagnosis not present

## 2014-04-28 DIAGNOSIS — I779 Disorder of arteries and arterioles, unspecified: Secondary | ICD-10-CM | POA: Diagnosis present

## 2014-04-28 DIAGNOSIS — I10 Essential (primary) hypertension: Secondary | ICD-10-CM | POA: Diagnosis not present

## 2014-04-28 DIAGNOSIS — Z87891 Personal history of nicotine dependence: Secondary | ICD-10-CM | POA: Insufficient documentation

## 2014-04-28 DIAGNOSIS — R0989 Other specified symptoms and signs involving the circulatory and respiratory systems: Secondary | ICD-10-CM | POA: Diagnosis present

## 2014-04-28 DIAGNOSIS — I708 Atherosclerosis of other arteries: Secondary | ICD-10-CM | POA: Diagnosis not present

## 2014-04-28 DIAGNOSIS — E785 Hyperlipidemia, unspecified: Secondary | ICD-10-CM | POA: Insufficient documentation

## 2014-04-28 DIAGNOSIS — Z96641 Presence of right artificial hip joint: Secondary | ICD-10-CM | POA: Insufficient documentation

## 2014-04-28 HISTORY — PX: CAROTID STENT INSERTION: SHX5505

## 2014-04-28 LAB — POCT ACTIVATED CLOTTING TIME: Activated Clotting Time: 306 seconds

## 2014-04-28 LAB — MRSA PCR SCREENING: MRSA by PCR: NEGATIVE

## 2014-04-28 SURGERY — CAROTID STENT INSERTION
Anesthesia: LOCAL

## 2014-04-28 MED ORDER — MORPHINE SULFATE 2 MG/ML IJ SOLN: 1.0000 mg | INTRAMUSCULAR | Status: DC | PRN

## 2014-04-28 MED ORDER — LIDOCAINE HCL (PF) 1 % IJ SOLN
INTRAMUSCULAR | Status: AC
  Filled 2014-04-28: qty 30

## 2014-04-28 MED ORDER — SODIUM CHLORIDE 0.9 % IJ SOLN: 3.0000 mL | INTRAMUSCULAR | Status: DC | PRN

## 2014-04-28 MED ORDER — ATROPINE SULFATE 0.1 MG/ML IJ SOLN
INTRAMUSCULAR | Status: AC
  Filled 2014-04-28: qty 10

## 2014-04-28 MED ORDER — LISINOPRIL 10 MG PO TABS
10.0000 mg | ORAL_TABLET | Freq: Every day | ORAL | Status: DC
  Administered 2014-04-29: 10 mg via ORAL
  Filled 2014-04-28: qty 1

## 2014-04-28 MED ORDER — NAPROXEN SODIUM 220 MG PO TABS: 220.0000 mg | ORAL_TABLET | Freq: Two times a day (BID) | ORAL | Status: DC | PRN

## 2014-04-28 MED ORDER — ASPIRIN EC 81 MG PO TBEC: 81.0000 mg | DELAYED_RELEASE_TABLET | Freq: Every day | ORAL | Status: DC

## 2014-04-28 MED ORDER — BIVALIRUDIN 250 MG IV SOLR
INTRAVENOUS | Status: AC
  Filled 2014-04-28: qty 250

## 2014-04-28 MED ORDER — CLOPIDOGREL BISULFATE 75 MG PO TABS: 75.0000 mg | ORAL_TABLET | Freq: Every day | ORAL | Status: DC

## 2014-04-28 MED ORDER — SODIUM CHLORIDE 0.9 % IV SOLN
INTRAVENOUS | Status: DC
  Administered 2014-04-28: 07:00:00 via INTRAVENOUS

## 2014-04-28 MED ORDER — COENZYME Q10 300 MG PO CAPS
300.0000 mg | ORAL_CAPSULE | Freq: Every day | ORAL | Status: DC
Start: 2014-04-28 — End: 2014-04-28

## 2014-04-28 MED ORDER — ONDANSETRON HCL 4 MG/2ML IJ SOLN: 4.0000 mg | Freq: Four times a day (QID) | INTRAMUSCULAR | Status: DC | PRN

## 2014-04-28 MED ORDER — HEPARIN (PORCINE) IN NACL 2-0.9 UNIT/ML-% IJ SOLN
INTRAMUSCULAR | Status: AC
Start: 2014-04-28 — End: 2014-04-28
  Filled 2014-04-28: qty 1000

## 2014-04-28 MED ORDER — ACETAMINOPHEN 325 MG PO TABS: 650.0000 mg | ORAL_TABLET | ORAL | Status: DC | PRN

## 2014-04-28 MED ORDER — CLOPIDOGREL BISULFATE 75 MG PO TABS
75.0000 mg | ORAL_TABLET | Freq: Every day | ORAL | Status: DC
  Administered 2014-04-29: 75 mg via ORAL
  Filled 2014-04-28 (×2): qty 1

## 2014-04-28 MED ORDER — SODIUM CHLORIDE 0.9 % IV SOLN
INTRAVENOUS | Status: AC
Start: 2014-04-28 — End: 2014-04-28

## 2014-04-28 MED ORDER — NOREPINEPHRINE BITARTRATE 1 MG/ML IV SOLN
INTRAVENOUS | Status: AC
  Filled 2014-04-28: qty 4

## 2014-04-28 MED ORDER — ASPIRIN 81 MG PO CHEW: 81.0000 mg | CHEWABLE_TABLET | ORAL | Status: DC

## 2014-04-28 MED ORDER — LORATADINE 10 MG PO TABS
10.0000 mg | ORAL_TABLET | Freq: Every day | ORAL | Status: DC
  Administered 2014-04-29: 10 mg via ORAL
  Filled 2014-04-28: qty 1

## 2014-04-28 MED ORDER — ATORVASTATIN CALCIUM 80 MG PO TABS
80.0000 mg | ORAL_TABLET | Freq: Every day | ORAL | Status: DC
  Administered 2014-04-29: 80 mg via ORAL
  Filled 2014-04-28: qty 1

## 2014-04-28 MED ORDER — IBUPROFEN 200 MG PO TABS
200.0000 mg | ORAL_TABLET | Freq: Four times a day (QID) | ORAL | Status: DC | PRN
  Filled 2014-04-28: qty 1

## 2014-04-28 MED ORDER — ASPIRIN EC 325 MG PO TBEC
325.0000 mg | DELAYED_RELEASE_TABLET | Freq: Every day | ORAL | Status: DC
  Administered 2014-04-29: 325 mg via ORAL
  Filled 2014-04-28: qty 1

## 2014-04-28 MED ORDER — ACETAMINOPHEN 500 MG PO TABS: 1000.0000 mg | ORAL_TABLET | Freq: Four times a day (QID) | ORAL | Status: DC | PRN

## 2014-04-28 NOTE — CV Procedure (Signed)
Jesus Klein is a 64 y.o. male    [id number] LOCATION:  FACILITY: [facility]  PHYSICIAN: Quay Burow, M.D. 14-Jun-1950   DATE OF PROCEDURE:  04/28/2014  DATE OF DISCHARGE:     PV Angiogram/Intervention    History obtained from chart review.Jesus Klein is a 64 year old thin-appearing Caucasian male father of 2 children, grandfather and 2 grandchildren referred by Karis Juba PA-C for peripheral vascular evaluation.I initially saw him in the office 02/12/14. He had Dopplers performed in the office today that showed high-grade ostial carotid, ostial/subclavian artery stenosis as well as an occluded left common iliac and high-grade right iliac stenosis. His cardiovascular risk factor profile is notable for a 30-pack-year history of tobacco abuse having stopped smoking 2 years ago as well as 2 hypertension. There is no family history of heart disease. He does have mild hyperlipidemia. He has never had a heart attack or stroke. Denies chest pain or shortness of breath. He did have a right total hip replacement performed by Dr. Sharol Given in 2007. He noticed lifestyle limiting claudication 6 months ago right greater than left. On 03/04/14 I performed aortic arch angiography, selective left carotid angiography, abdominal aortography as well as right common iliac and external iliac intervention. His right lower extremity claudication has completely resolved. His ABI has normalized. He does not notice claudication as much in his left leg. Because of his high grade near ostial left common carotid artery stenosis he presents now for PTA and stenting.  Operators: Dr. Lorretta Harp, Dr. Annamarie Major    PROCEDURE DESCRIPTION:   The patient was brought to the second floor [facility] lab in the postabsorptive state. He was not premedicated . His right groinwas prepped and shaved in usual sterile fashion. Xylocaine 1% was used for local anesthesia. A 5 French sheath was inserted into the right common  femoral artery using standard Seldinger technique. A 5 French JB1 catheter was used for selective left common carotid angiography. Visipaque dye was used for the entirety of the case. Retrograde aortic pressure was monitored during the case. The patient was on dual antiplatelet therapy.   HEMODYNAMICS:    AO SYSTOLIC/AO DIASTOLIC: 856/31   Angiographic Data: angiography of the left common carotid artery revealed a 90% near ostial stenosis.   IMPRESSION:high-grade near ostial left common carotid artery stenosis. We will proceed with PT A and stenting using a 0.35 cm platform and a balloon expandable stent.  Procedure Description:the patient received Angiomax bolus and ACT documented at 306. Total contrast administered the patient was 45 cc. I exchanged the 5 French sheath in the right common femoral artery for a 6 French/90 cm long shuttle sheath over the [employer]. Using a 6 French JB1 catheter I accessed the left common carotid artery and used the first wire for stability. I then advanced the sheath to the origin of the left common carotid artery and withdrew the JB 1 catheter. Following this I primarily stented the origin of the left common carotid artery with a 7 mm x 19 mm Omnilink balloon expandable stent by [employer]. This was deployed at 10 atm. The balloon was then removed and completion angiography revealed 0% residual stenosis. The leading edge of the stent was approximately 1-2 mm beyond the takeoff completely covering the diseased segment. The patient was neurologically intact at the end of the procedure. The shuttle sheath was then exchanged over the first wire for a short 6 [place] sheath. This was secured and the patient left the  lab in stable condition..  Final Impression: successful PT a and stenting of high-grade proximal left common carotid artery stenosis using Angiomax, antiplatelet therapy including aspirin and Plavix and a balloon expandable stent. This was done by myself and  Dr. Trula Slade . The patient left the lab in stable condition. He'll be hydrated and the sheath removed in several hours, patient will be held. He'll be discharged home in the morning. He will remain on lifelong antiplatelet therapy. Post procedure carotid Doppler's will be performed in the modified office after which she will see me back in 2-3 weeks.    Lorretta Harp MD, [facility] 04/28/2014 9:42 AM

## 2014-04-28 NOTE — Interval H&P Note (Signed)
History and Physical Interval Note:  04/28/2014 8:28 AM  Jesus Klein  has presented today for surgery, with the diagnosis of carotid stenosis  The various methods of treatment have been discussed with the patient and family. After consideration of risks, benefits and other options for treatment, the patient has consented to  Procedure(s): CAROTID STENT INSERTION (N/A) as a surgical intervention .  The patient's history has been reviewed, patient examined, no change in status, stable for surgery.  I have reviewed the patient's chart and labs.  Questions were answered to the patient's satisfaction.     Lorretta Harp

## 2014-04-28 NOTE — Progress Notes (Signed)
Wife in to visit.

## 2014-04-28 NOTE — Telephone Encounter (Signed)
Form received. Signed by MD. Virgel Gess completed copy to number on form.

## 2014-04-28 NOTE — H&P (View-Only) (Signed)
03/31/2014 Jesus Klein   May 12, 1950  [id number]  Primary Physician Jesus Juba, PA-C Primary Cardiologist: Jesus Harp MD Jesus Klein   HPI:  Jesus Klein is a 64 year old thin-appearing Caucasian male father of 2 children, grandfather and 2 grandchildren referred by Jesus Juba PA-C for peripheral vascular evaluation.I initially saw him in the office 02/12/14. He had Dopplers performed in the office today that showed high-grade ostial carotid, ostial/subclavian artery stenosis as well as an occluded left common iliac and high-grade right iliac stenosis. His cardiovascular risk factor profile is notable for a 30-pack-year history of tobacco abuse having stopped smoking 2 years ago as well as 2 hypertension. There is no family history of heart disease. He does have mild hyperlipidemia. He has never had a heart attack or stroke. Denies chest pain or shortness of breath. He did have a right total hip replacement performed by Dr. Sharol Klein in 2007. He noticed lifestyle limiting claudication 6 months ago right greater than left. On 03/04/14 I performed aortic arch angiography, selective left carotid angiography, abdominal aortography as well as right common iliac and external iliac intervention. His right lower extremity claudication has completely resolved. His ABI has normalized. He does not notice claudication as much in his left leg.   Current Outpatient Prescriptions  Medication Sig Dispense Refill  . acetaminophen (TYLENOL) 325 MG tablet Take 2 tablets (650 mg total) by mouth every 4 (four) hours as needed for headache or mild pain.    Marland Kitchen aspirin EC 81 MG tablet Take 81 mg by mouth daily.    Marland Kitchen atorvastatin (LIPITOR) 80 MG tablet Take 1 tablet (80 mg total) by mouth daily. 90 tablet 3  . cetirizine (ZYRTEC) 10 MG tablet Take 10 mg by mouth daily.    . cholecalciferol (VITAMIN D) 1000 UNITS tablet Take 1,000 Units by mouth daily.    . clopidogrel (PLAVIX) 75 MG tablet Take 1  tablet (75 mg total) by mouth daily with breakfast. 30 tablet 11  . Coenzyme Q10 (EQL COQ10) 300 MG CAPS Take 1 capsule by mouth daily.    Marland Kitchen lisinopril (PRINIVIL,ZESTRIL) 10 MG tablet Take 1 tablet (10 mg total) by mouth daily. 90 tablet 3  . Multiple Vitamin (MULTIVITAMIN) tablet Take 1 tablet by mouth daily.    . naproxen sodium (ALEVE) 220 MG tablet Take 220 mg by mouth 2 (two) times daily as needed (pain).     . Omega-3 Fatty Acids (OMEGA 3 PO) Take 1,400 mg by mouth every other day.     No current facility-administered medications for this visit.    No Known Allergies  History   Social History  . Marital Status: Married    Spouse Name: N/A    Number of Children: N/A  . Years of Education: N/A   Occupational History  . Not on file.   Social History Main Topics  . Smoking status: Former Smoker -- 1.50 packs/day for 30 years    Types: Cigarettes    Quit date: 01/19/2009  . Smokeless tobacco: Never Used  . Alcohol Use: 1.2 oz/week    2 Cans of beer per week  . Drug Use: No  . Sexual Activity: Yes    Birth Control/ Protection: None   Other Topics Concern  . 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 He may occasionally lift 20 pounds but most is done by  machine.    Mix of standing, sitting, moving around.      Used to walk a lot and do a lot outside. Got a treadmill to use during cold weather.   Married.   2 daughters- grown.           Review of Systems: General: negative for chills, fever, night sweats or weight changes.  Cardiovascular: negative for chest pain, dyspnea on exertion, edema, orthopnea, palpitations, paroxysmal nocturnal dyspnea or shortness of breath Dermatological: negative for rash Respiratory: negative for cough or wheezing Urologic: negative for hematuria Abdominal: negative for nausea, vomiting, diarrhea, bright red blood per rectum, melena, or  hematemesis Neurologic: negative for visual changes, syncope, or dizziness All other systems reviewed and are otherwise negative except as noted above.    Blood pressure 148/72, pulse 96, height 5\' 10"  (1.778 m), weight 187 lb 11.2 oz (85.14 kg).  General appearance: alert and no distress Neck: no adenopathy, no JVD, supple, symmetrical, trachea midline, thyroid not enlarged, symmetric, no tenderness/mass/nodules and loud left carotid and subclavian bruit, soft right carotid bruit Lungs: clear to auscultation bilaterally Heart: regular rate and rhythm, S1, S2 normal, no murmur, click, rub or gallop Extremities: extremities normal, atraumatic, no cyanosis or edema and 2+ right femoral soft bruit  EKG not performed today  ASSESSMENT AND PLAN:   Essential hypertension History of hypertension with blood pressure measured today 140/72. He is on lisinopril 10 mg a day. Continue current meds at current dosing  Hyperlipidemia History of hyperlipidemia on atorvastatin 40 mg a day. His most recent lipid profile performed 02/02/14 revealed a total closure of 209, LDL of 110 and HDL of 41. We will increase his atorvastatin 80 mg a day  Claudication of lower extremity History of bilateral lower extremity claudication. I recently performed angiography on him 03/04/14 revealing high-grade ostial right common iliac artery stenosis as well as tandem lesions in his right common femoral and external iliac artery all of which were stented. His ABIs improved from 0.65 on the right to 1.0. He does have an occluded left common iliac artery with a left ABI 0.59. Postintervention he has noticed complete resolution of his right lower extremity claudication as well as improvement on the left. He is on dual antiplatelet therapy.  Bilateral carotid bruits Angiography revealed a 95% ostial left common carotid artery stenosis at the origin of the aorta. He also had an 80% left subclavian artery stenosis denies left  upper extremity claudication or subclavian steal symptoms. I believe that his left common carotid artery ostial stenosis should be stented. We talked about the procedure including the risks and benefits. I will plan on doing this sometime after the first liter with Dr. Trula Slade .      Jesus Harp MD [facility], [facility] 03/31/2014 4:53 PM

## 2014-04-28 NOTE — Progress Notes (Signed)
Ate hot lunch form kitchen.

## 2014-04-28 NOTE — Progress Notes (Signed)
6 fr 10 CM sheath removed from rt fem artery. Manual pressure held for 20 min. No hematoma or echymosis.

## 2014-04-28 NOTE — Progress Notes (Signed)
PHARMACIST - PHYSICIAN ORDER COMMUNICATION  CONCERNING: P&T Medication Policy on Herbal Medications  DESCRIPTION:  This patient's order for:  CoQ10  has been noted.  This product(s) is classified as an "herbal" or natural product. Due to a lack of definitive safety studies or FDA approval, nonstandard manufacturing practices, plus the potential risk of unknown drug-drug interactions while on inpatient medications, the Pharmacy and Therapeutics Committee does not permit the use of "herbal" or natural products of this type within [facility].   ACTION TAKEN: The pharmacy department is unable to verify this order at this time and your patient has been informed of this safety policy. Please reevaluate patient's clinical condition at discharge and address if the herbal or natural product(s) should be resumed at that time.  Erin Hearing PharmD., BCPS Clinical Pharmacist Pager [phone] 04/28/2014 4:51 PM

## 2014-04-28 NOTE — Progress Notes (Signed)
Neuro assessment: PERRL. Upper and lower extremity strength equal bilaterally. Smile symmetrical; tongue midline. Speech clear and appropriate. Alert and oriented X 3.

## 2014-04-29 ENCOUNTER — Encounter (HOSPITAL_COMMUNITY): Payer: Self-pay | Admitting: *Deleted

## 2014-04-29 ENCOUNTER — Other Ambulatory Visit: Payer: Self-pay | Admitting: Cardiology

## 2014-04-29 DIAGNOSIS — I739 Peripheral vascular disease, unspecified: Secondary | ICD-10-CM

## 2014-04-29 DIAGNOSIS — I779 Disorder of arteries and arterioles, unspecified: Secondary | ICD-10-CM

## 2014-04-29 DIAGNOSIS — I6522 Occlusion and stenosis of left carotid artery: Secondary | ICD-10-CM | POA: Diagnosis not present

## 2014-04-29 DIAGNOSIS — Z959 Presence of cardiac and vascular implant and graft, unspecified: Secondary | ICD-10-CM

## 2014-04-29 LAB — BASIC METABOLIC PANEL
Anion gap: 5 (ref 5–15)
BUN: 11 mg/dL (ref 6–23)
CALCIUM: 9.1 mg/dL (ref 8.4–10.5)
CO2: 29 mmol/L (ref 19–32)
Chloride: 106 mEq/L (ref 96–112)
Creatinine, Ser: 0.91 mg/dL (ref 0.50–1.35)
GFR calc Af Amer: >90 mL/min (ref >90)
GFR, EST NON AFRICAN AMERICAN: 89 mL/min — AB (ref >90)
GLUCOSE: 93 mg/dL (ref 70–99)
Potassium: 4.1 mmol/L (ref 3.5–5.1)
Sodium: 140 mmol/L (ref 135–145)

## 2014-04-29 LAB — CBC
HCT: 43.8 % (ref 39.0–52.0)
Hemoglobin: 14 g/dL (ref 13.0–17.0)
MCH: 29.7 pg (ref 26.0–34.0)
MCHC: 32 g/dL (ref 30.0–36.0)
MCV: 92.8 fL (ref 78.0–100.0)
Platelets: 262 10*3/uL (ref 150–400)
RBC: 4.72 MIL/uL (ref 4.22–5.81)
RDW: 12.8 % (ref 11.5–15.5)
WBC: 5.9 10*3/uL (ref 4.0–10.5)

## 2014-04-29 MED ORDER — ASPIRIN 325 MG PO TBEC: 325.0000 mg | DELAYED_RELEASE_TABLET | Freq: Every day | ORAL | Status: DC

## 2014-04-29 MED FILL — Sodium Chloride IV Soln 0.9%: INTRAVENOUS | Qty: 50 | Status: AC

## 2014-04-29 NOTE — Progress Notes (Signed)
Subjective:  No CP/SOB S/P LCCA stent  Objective:  Temp:  [97.8 F (36.6 C)-98.2 F (36.8 C)] 98.1 F (36.7 C) (01/07 0300) Pulse Rate:  [60-79] 71 (01/07 0800) Resp:  [10-23] 14 (01/07 0800) BP: (93-151)/(49-104) 104/62 mmHg (01/07 0800) SpO2:  [93 %-98 %] 97 % (01/07 0800) Weight change:   Intake/Output from previous day: 01/06 0701 - 01/07 0700 In: 518.8 [I.V.:518.8] Out: 400 [Urine:400]  Intake/Output from this shift:    Physical Exam: General appearance: alert and no distress Neck: no adenopathy, no carotid bruit, no JVD, supple, symmetrical, trachea midline and thyroid not enlarged, symmetric, no tenderness/mass/nodules Lungs: clear to auscultation bilaterally Heart: regular rate and rhythm, S1, S2 normal, no murmur, click, rub or gallop Extremities: Right groin OK  Lab Results: Results for orders placed or performed during the hospital encounter of 04/28/14 (from the past 48 hour(s))  POCT Activated clotting time     Status: None   Collection Time: 04/28/14  9:17 AM  Result Value Ref Range   Activated Clotting Time 306 seconds  MRSA PCR Screening     Status: None   Collection Time: 04/28/14  4:31 PM  Result Value Ref Range   MRSA by PCR NEGATIVE NEGATIVE    Comment:        The GeneXpert MRSA Assay (FDA approved for NASAL specimens only), is one component of a comprehensive MRSA colonization surveillance program. It is not intended to diagnose MRSA infection nor to guide or monitor treatment for MRSA infections.   Basic metabolic panel      Status: Abnormal   Collection Time: 04/29/14  2:38 AM  Result Value Ref Range   Sodium 140 135 - 145 mmol/L    Comment: Please note change in reference range.   Potassium 4.1 3.5 - 5.1 mmol/L    Comment: Please note change in reference range.   Chloride 106 96 - 112 mEq/L   CO2 29 19 - 32 mmol/L   Glucose, Bld 93 70 - 99 mg/dL   BUN 11 6 - 23 mg/dL   Creatinine, Ser 0.91 0.50 - 1.35 mg/dL   Calcium 9.1  8.4 - 10.5 mg/dL   GFR calc non Af Amer 89 (L) >90 mL/min   GFR calc Af Amer >90 >90 mL/min    Comment: (NOTE) The eGFR has been calculated using the CKD EPI equation. This calculation has not been validated in all clinical situations. eGFR's persistently <90 mL/min signify possible Chronic Kidney Disease.    Anion gap 5 5 - 15  CBC     Status: None   Collection Time: 04/29/14  2:38 AM  Result Value Ref Range   WBC 5.9 4.0 - 10.5 K/uL   RBC 4.72 4.22 - 5.81 MIL/uL   Hemoglobin 14.0 13.0 - 17.0 g/dL   HCT 43.8 39.0 - 52.0 %   MCV 92.8 78.0 - 100.0 fL   MCH 29.7 26.0 - 34.0 pg   MCHC 32.0 30.0 - 36.0 g/dL   RDW 12.8 11.5 - 15.5 %   Platelets 262 150 - 400 K/uL    Imaging: Imaging results have been reviewed  Assessment/Plan:   1. Active Problems: 2.   PVD - Carotid, subclavian, bilat LE disease 3.   Left-sided carotid artery disease 4.   Time Spent Directly with Patient:  15 minutes  Length of Stay:  LOS: 1 day   POD #1 LCCA PTA/Stent performed with Dr. Trula Slade. Groin OK. Labs OK. D/C home on DAPT.  Carotid dopplers at [facility] next week then ROV with a MLP 2-3 weeks on day I'm in the office.  Lorretta Harp 04/29/2014, 9:18 AM

## 2014-04-29 NOTE — Discharge Instructions (Signed)
Call [facility] at [phone] if any bleeding, swelling or drainage at cath site.  May shower, no tub baths for 48 hours for groin sticks. No lifting over 5 pounds for 3 days.  No Driving for 3 days.    Heart healthy diet  The office will call and give time and date for carotid doppler follow up.  Also for a follow up appointment.

## 2014-04-29 NOTE — Discharge Summary (Signed)
Physician Discharge Summary       Patient ID: Jesus Klein MRN: [id number] DOB/AGE: [birth year] 64 y.o.  Admit date: 04/28/2014 Discharge date: 04/29/2014 Primary Cardiologist:Dr. Adora Fridge   Discharge Diagnoses:  Principal Problem:   Left-sided carotid artery disease s/p LCCA PTA/Stent 04/28/14 Active Problems:   PVD - Carotid, subclavian, bilat LE disease   Bilateral carotid bruits   Essential hypertension   Hyperlipidemia   Discharged Condition: good  Procedures: 04/28/14 successful PTA and stenting of high-grade proximal left common carotid artery stenosis using Angiomax, antiplatelet therapy including aspirin and Plavix and a balloon expandable stent. This was done by Dr. Gwenlyn Found and Dr. Trula Slade.   [facility] Course: Jesus Klein is a 64 year old thin-appearing Caucasian male father of 2 children, grandfather and 2 grandchildren referred by Karis Juba PA-C for peripheral vascular evaluation. Dr. Adora Fridge initially saw him in the office 02/12/14. He had Dopplers performed in the office today that showed high-grade ostial carotid, ostial/subclavian artery stenosis as well as an occluded left common iliac and high-grade right iliac stenosis. His cardiovascular risk factor profile is notable for a 30-pack-year history of tobacco abuse having stopped smoking 2 years ago as well as hypertension. There is no family history of heart disease. He does have mild hyperlipidemia. He has never had a heart attack or stroke. Denies chest pain or shortness of breath. He did have a right total hip replacement performed by Dr. Sharol Given in 2007. He noticed lifestyle limiting claudication 6 months ago right greater than left.   On 03/04/14 Dr. Adora Fridge  performed aortic arch angiography, selective left carotid angiography, abdominal aortography as well as right common iliac and external iliac intervention. His right lower extremity claudication has completely resolved. His ABI has normalized. He does not notice  claudication as much in his left leg.  Angiography in Nov. revealed a 95% ostial left common carotid artery stenosis at the origin of the aorta. He also had an 80% left subclavian artery stenosis denies left upper extremity claudication or subclavian steal symptoms. Pt came in electively on the 6th of Jan. For PCI with Dr. Adora Fridge and Dr. Trula Slade.   He underwent successful PTA and stenting of high-grade proximal left common carotid artery stenosis using Angiomax, antiplatelet therapy including aspirin and Plavix and a balloon expandable stent.   Pt seen and evaluated by Dr. Adora Fridge today and found ready for discharge.  Pt ambulated without complicaitons.  D/C home on DAPT. Carotid dopplers at [facility] next week then ROV with a MLP 2-3 weeks on day Dr. Adora Fridge in the office.   Consults: None  Significant Diagnostic Studies:  BMET    Component Value Date/Time   NA 140 04/29/2014 0238   NA 141 03/11/2013 1705   K 4.1 04/29/2014 0238   CL 106 04/29/2014 0238   CO2 29 04/29/2014 0238   GLUCOSE 93 04/29/2014 0238   GLUCOSE 100* 03/11/2013 1705   BUN 11 04/29/2014 0238   BUN 11 03/11/2013 1705   CREATININE 0.91 04/29/2014 0238   CREATININE 0.84 04/26/2014 0921   CALCIUM 9.1 04/29/2014 0238   GFRNONAA 89* 04/29/2014 0238   GFRNONAA >89 02/02/2014 0952   GFRAA >90 04/29/2014 0238   GFRAA >89 02/02/2014 0952    CBC    Component Value Date/Time   WBC 5.9 04/29/2014 0238   WBC 5.6 03/11/2013 1746   RBC 4.72 04/29/2014 0238   RBC 5.9 03/11/2013 1746   HGB 14.0 04/29/2014 0238   HGB 17.1  03/11/2013 1746   HCT 43.8 04/29/2014 0238   HCT 53.4 03/11/2013 1746   PLT 262 04/29/2014 0238   MCV 92.8 04/29/2014 0238   MCV 90.8 03/11/2013 1746   MCH 29.7 04/29/2014 0238   MCH 29.2 03/11/2013 1746   MCHC 32.0 04/29/2014 0238   MCHC 32.1 03/11/2013 1746   RDW 12.8 04/29/2014 0238       Discharge Exam: Blood pressure 134/60, pulse 71, temperature 98.1 F (36.7 C), temperature source  Oral, resp. rate 14, height 5\' 10"  (1.778 m), weight 185 lb (83.915 kg), SpO2 97 %.    Disposition: 01-Home or Self Care     Medication List    STOP taking these medications        ibuprofen 200 MG tablet  Commonly known as:  ADVIL,MOTRIN      TAKE these medications        acetaminophen 500 MG tablet  Commonly known as:  TYLENOL  Take 1,000 mg by mouth every 6 (six) hours as needed (pain).     ALEVE 220 MG tablet  Generic drug:  naproxen sodium  Take 220 mg by mouth 2 (two) times daily as needed (pain).     ARTIFICIAL TEARS 1.4 % ophthalmic solution  Generic drug:  polyvinyl alcohol  Place 1 drop into both eyes daily as needed for dry eyes (allergies).     aspirin 325 MG EC tablet  Take 1 tablet (325 mg total) by mouth daily.     atorvastatin 80 MG tablet  Commonly known as:  LIPITOR  Take 1 tablet (80 mg total) by mouth daily.     cetirizine 10 MG tablet  Commonly known as:  ZYRTEC  Take 10 mg by mouth daily.     cholecalciferol 1000 UNITS tablet  Commonly known as:  VITAMIN D  Take 1,000 Units by mouth daily.     clopidogrel 75 MG tablet  Commonly known as:  PLAVIX  Take 1 tablet (75 mg total) by mouth daily with breakfast.     EQL COQ10 300 MG Caps  Generic drug:  Coenzyme Q10  Take 300 mg by mouth daily.     FISH OIL TRIPLE STRENGTH 1400 MG Caps  Take 1,400 mg by mouth every other day.     lisinopril 10 MG tablet  Commonly known as:  PRINIVIL,ZESTRIL  Take 1 tablet (10 mg total) by mouth daily.     multivitamin with minerals Tabs tablet  Take 1 tablet by mouth daily.          Discharge Instructions: Call [facility] at [phone] if any bleeding, swelling or drainage at cath site.  May shower, no tub baths for 48 hours for groin sticks. No lifting over 5 pounds for 3 days.  No Driving for 3 days.    Heart healthy diet  The office will call and give time and date for carotid doppler follow up.  Also for a follow up  appointment.     Signed: Isaiah Serge Nurse Practitioner-Certified [facility]: [facility] 04/29/2014, 11:40 AM  Time spent on discharge : > 35 minutes.

## 2014-04-29 NOTE — Progress Notes (Signed)
Utilization Review Completed.Jesus Klein T1/10/2014  

## 2014-05-04 ENCOUNTER — Telehealth (HOSPITAL_COMMUNITY): Payer: Self-pay | Admitting: *Deleted

## 2014-05-05 ENCOUNTER — Telehealth: Payer: Self-pay | Admitting: Cardiovascular Disease

## 2014-05-05 NOTE — Telephone Encounter (Signed)
Closed encounter °

## 2014-05-21 ENCOUNTER — Ambulatory Visit: Payer: Managed Care, Other (non HMO) | Admitting: Cardiology

## 2014-05-21 ENCOUNTER — Ambulatory Visit (HOSPITAL_COMMUNITY)
Admission: RE | Admit: 2014-05-21 | Discharge: 2014-05-21 | Disposition: A | Discharge status: Home / Self Care | Payer: BLUE CROSS/BLUE SHIELD | Source: Ambulatory Visit | Attending: Internal Medicine | Admitting: Internal Medicine

## 2014-05-21 DIAGNOSIS — I6522 Occlusion and stenosis of left carotid artery: Secondary | ICD-10-CM

## 2014-05-21 DIAGNOSIS — I6523 Occlusion and stenosis of bilateral carotid arteries: Secondary | ICD-10-CM | POA: Diagnosis not present

## 2014-05-21 DIAGNOSIS — I739 Peripheral vascular disease, unspecified: Secondary | ICD-10-CM | POA: Insufficient documentation

## 2014-05-21 DIAGNOSIS — I779 Disorder of arteries and arterioles, unspecified: Secondary | ICD-10-CM

## 2014-05-21 DIAGNOSIS — Z48812 Encounter for surgical aftercare following surgery on the circulatory system: Secondary | ICD-10-CM | POA: Insufficient documentation

## 2014-05-21 DIAGNOSIS — I708 Atherosclerosis of other arteries: Secondary | ICD-10-CM | POA: Diagnosis not present

## 2014-05-21 DIAGNOSIS — Z959 Presence of cardiac and vascular implant and graft, unspecified: Secondary | ICD-10-CM

## 2014-05-21 NOTE — Progress Notes (Signed)
Carotid Duplex Completed. Synthia Fairbank, BS, RDMS, RVT  

## 2014-05-27 ENCOUNTER — Telehealth: Payer: Self-pay | Admitting: Cardiovascular Disease

## 2014-06-01 ENCOUNTER — Encounter: Payer: Self-pay | Admitting: Physician Assistant

## 2014-06-01 DIAGNOSIS — Z87891 Personal history of nicotine dependence: Secondary | ICD-10-CM | POA: Insufficient documentation

## 2014-06-01 NOTE — Progress Notes (Signed)
Cardiology Office Note Date:  06/02/2014  Patient ID:  Jesus Klein, Jesus Klein May 20, 1950, MRN [id number] PCP:  Karis Juba, PA-C  Cardiologist:  Gwenlyn Found   Chief Complaint: follow-up of stenting  History of Present Illness: Jesus Klein is a 64 y.o. male with history of PVD, prior tobacco abuse, HLD who presents for post-hospital follow-up. He has no cardiac history. He reports a prior stress test but I do not have access to these records. He was seen by Dr. Gwenlyn Found in the fall with doppers showing high-grade ostial carotid, ostial/subclavian artery stenosis as well as an occluded left common iliac and high-grade right iliac stenosis. He was also noting claudication. On 03/04/14 Dr. Adora Fridge performed aortic arch angiography, selective left carotid angiography, abdominal aortography as well as right common iliac and external iliac intervention. His right lower extremity claudication completely resolved. His ABI normalized. Subsequent angiography in November revealed a 95% ostial left common carotid artery stenosis at the origin of the aorta. He also had an 80% left subclavian artery stenosis. He denied left upper extremity claudication or subclavian steal symptoms. He was brought in 04/28/14 for elective intervention by Dr. Gwenlyn Found and Dr. Trula Slade and underwent successful PTA and stenting of high-grade proximal left common carotid artery stenosis using Angiomax, antiplatelet therapy including aspirin and Plavix and a balloon expandable stent. Post-procedure course uneventful. Post-angio labs normal. F/u carotid duplex 05/21/14 - Normal post common carotid artery stenting Doppler study, repeat due in 6 months.  He is doing well post-procedure. No stroke or TIA symptoms. No significant claudication. He exercises daily on a treadmill and recumbent bike without CP, SOB or limiting symptoms. 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 Last LDL 110 in 01/2014  pre-Lipitor.   Past Medical History  Diagnosis Date  . Hypertension 2010  . Hyperlipidemia   . Peripheral arterial disease     a. 02/2014 - right common iliac and external iliac intervention. b. H/o PTA/stenting of left common carotid artery stenosis. Known 80% left subclavian artery stenosis without UE claudication/steal sx.  . Arthritis   . Left-sided carotid artery disease     high-grade ostial left common carotid artery stenosis  . Subclavian artery stenosis, left   . History of tobacco abuse     Past Surgical History  Procedure Laterality Date  . Total hip arthroplasty Right 10/2005  . Joint replacement    . Iliac artery stent Right 03/04/2014    w/as well as right external iliac and common femoral artery stenting/notes 03/04/2014  . Lower extremity angiogram N/A 03/04/2014    Procedure: LOWER EXTREMITY ANGIOGRAM;  Surgeon: Lorretta Harp, MD;  Location[facility] C[facility] LAB;  Service: Cardiovascular;  Laterality: N/A;  . Carotid stent insertion N/A 04/28/2014    Procedure: CAROTID STENT INSERTION;  Surgeon: Serafina Mitchell, MD;  Location: [facility] CATH LAB;  Service: Cardiovascular;  Laterality: N/A;    Current Outpatient Prescriptions  Medication Sig Dispense Refill  . acetaminophen (TYLENOL) 500 MG tablet Take 1,000 mg by mouth every 6 (six) hours as needed (pain).    Marland Kitchen aspirin EC 325 MG EC tablet Take 1 tablet (325 mg total) by mouth daily. 30 tablet 0  . atorvastatin (LIPITOR) 80 MG tablet Take 1 tablet (80 mg total) by mouth daily. 90 tablet 3  . cetirizine (ZYRTEC) 10 MG tablet Take 10 mg by mouth daily.    . cholecalciferol (VITAMIN D) 1000 UNITS tablet Take 1,000 Units  by mouth daily.    . clopidogrel (PLAVIX) 75 MG tablet Take 1 tablet (75 mg total) by mouth daily with breakfast. 30 tablet 11  . Coenzyme Q10 (EQL COQ10) 300 MG CAPS Take 300 mg by mouth daily.     Marland Kitchen lisinopril (PRINIVIL,ZESTRIL) 10 MG tablet Take 1 tablet (10 mg total) by mouth daily. 90 tablet 3  . Multiple  Vitamin (MULTIVITAMIN WITH MINERALS) TABS tablet Take 1 tablet by mouth daily.    . naproxen sodium (ALEVE) 220 MG tablet Take 220 mg by mouth 2 (two) times daily as needed (pain).     . Omega-3 Fatty Acids (FISH OIL TRIPLE STRENGTH) 1400 MG CAPS Take 1,400 mg by mouth every other day.    . polyvinyl alcohol (ARTIFICIAL TEARS) 1.4 % ophthalmic solution Place 1 drop into both eyes daily as needed for dry eyes (allergies).     No current facility-administered medications for this visit.    Allergies:   Review of patient's allergies indicates no known allergies.   Social History:  The patient  reports that he quit smoking about 5 years ago. His smoking use included Cigarettes. He has a 45 pack-year smoking history. He has never used smokeless tobacco. He reports that he drinks about 1.2 oz of alcohol per week. He reports that he does not use illicit drugs.   Family History:  The patient's family history includes Arthritis in his father; COPD in his father; Diabetes in his father; Hyperlipidemia in his mother; Hypertension in his father.  ROS:  Please see the history of present illness.   All other systems are reviewed and otherwise negative.   PHYSICAL EXAM:  VS:  BP 110/70 mmHg  Pulse 84  Ht 5\' 10"  (1.778 m)  Wt 185 lb 8 oz (84.142 kg)  BMI 26.62 kg/m2 BMI: Body mass index is 26.62 kg/(m^2). Well nourished, well developed WM, in no acute distress HEENT: normocephalic, atraumatic Neck: no JVD Cardiac:  normal S1, S2; RRR; no murmur Lungs:  clear to auscultation bilaterally, no wheezing, rhonchi or rales Abd: soft, nontender, no hepatomegaly Ext: no edema. Angio site well-healed without bruit or hematoma; soft right femoral bruit (known from before per chart) Skin: warm and dry Neuro:  moves all extremities spontaneously, no focal abnormalities noted  Recent Labs: 02/02/2014: ALT 17 02/26/2014: TSH 1.200 04/29/2014: BUN 11; Creatinine 0.91; Hemoglobin 14.0; Platelets 262; Potassium 4.1;  Sodium 140  02/02/2014: Cholesterol, Total 209*; HDL Cholesterol by NMR 41; LDL (calc) 110*; Total CHOL/HDL Ratio 5.1; Triglycerides 291*; VLDL 58*   CrCl cannot be calculated (Patient has no serum creatinine result on file.).   Wt Readings from Last 3 Encounters:  06/02/14 185 lb 8 oz (84.142 kg)  04/28/14 185 lb (83.915 kg)  03/31/14 187 lb 11.2 oz (85.14 kg)     Other studies reviewed: Additional studies/records reviewed today include: hospital records as above.  ASSESSMENT AND PLAN:  1. PVD as above, s/p RLE intervention, with recent carotid stenting - doing well. F/u carotid duplex in 6 months. Continue ASA and Plavix but decrease ASA to 81mg  daily. Today we did discuss increased risk of CAD given peripheral vascular disease and the patient will notify our office for any anginal symptoms. He remains very active and in good shape. 2. HTN - controlled. 3. Hyperlipidemia - will recheck lipids and liver function today. Fortunately he has not eaten yet today. 4. Former tobacco abuse - remains abstinent.  Disposition: F/u with Dr. Gwenlyn Found in 6 months.  Current medicines  are reviewed at length with the patient today.  The patient did not have any concerns regarding medicines.  Raechel Ache PA-C 06/02/2014 9:54 AM     [facility] [address], [place], [place] [address] Phone: [phone]

## 2014-06-02 ENCOUNTER — Ambulatory Visit: Payer: BLUE CROSS/BLUE SHIELD | Admitting: Physician Assistant

## 2014-06-02 ENCOUNTER — Encounter: Payer: Self-pay | Admitting: Physician Assistant

## 2014-06-02 ENCOUNTER — Ambulatory Visit (INDEPENDENT_AMBULATORY_CARE_PROVIDER_SITE_OTHER): Payer: BLUE CROSS/BLUE SHIELD | Admitting: Physician Assistant

## 2014-06-02 VITALS — BP 110/70 | HR 84 | Ht 70.0 in | Wt 185.5 lb

## 2014-06-02 DIAGNOSIS — E785 Hyperlipidemia, unspecified: Secondary | ICD-10-CM

## 2014-06-02 DIAGNOSIS — I10 Essential (primary) hypertension: Secondary | ICD-10-CM

## 2014-06-02 DIAGNOSIS — I779 Disorder of arteries and arterioles, unspecified: Secondary | ICD-10-CM

## 2014-06-02 DIAGNOSIS — I739 Peripheral vascular disease, unspecified: Secondary | ICD-10-CM

## 2014-06-02 DIAGNOSIS — Z79899 Other long term (current) drug therapy: Secondary | ICD-10-CM

## 2014-06-02 DIAGNOSIS — Z720 Tobacco use: Secondary | ICD-10-CM

## 2014-06-02 LAB — HEPATIC FUNCTION PANEL
ALBUMIN: 4.6 g/dL (ref 3.5–5.2)
ALT: 21 U/L (ref 0–53)
AST: 21 U/L (ref 0–37)
Alkaline Phosphatase: 88 U/L (ref 39–117)
BILIRUBIN TOTAL: 0.7 mg/dL (ref 0.2–1.2)
Bilirubin, Direct: 0.1 mg/dL (ref 0.0–0.3)
Indirect Bilirubin: 0.6 mg/dL (ref 0.2–1.2)
Total Protein: 7.1 g/dL (ref 6.0–8.3)

## 2014-06-02 LAB — LIPID PANEL
Cholesterol: 153 mg/dL (ref 0–200)
HDL: 38 mg/dL — ABNORMAL LOW (ref >39)
LDL Cholesterol: 85 mg/dL (ref 0–99)
TRIGLYCERIDES: 151 mg/dL — AB (ref <150)
Total CHOL/HDL Ratio: 4 Ratio
VLDL: 30 mg/dL (ref 0–40)

## 2014-06-02 MED ORDER — ASPIRIN EC 81 MG PO TBEC: 81.0000 mg | DELAYED_RELEASE_TABLET | Freq: Every day | ORAL | Status: DC

## 2014-06-02 NOTE — Telephone Encounter (Signed)
Closed encounter °

## 2014-06-02 NOTE — Patient Instructions (Signed)
Your physician recommends that you return for lab work in: TODAY at [facility].  DECREASE Aspirin to 81mg  daily.  Your physician has requested that you have a carotid duplex. This test is an ultrasound of the carotid arteries in your neck. It looks at blood flow through these arteries that supply the brain with blood. Allow one hour for this exam. There are no restrictions or special instructions.  Your physician recommends that you schedule a follow-up appointment in: 6 months with Dr. Gwenlyn Found.

## 2014-06-04 ENCOUNTER — Telehealth: Payer: Self-pay | Admitting: Cardiovascular Disease

## 2014-06-04 ENCOUNTER — Telehealth: Payer: Self-pay | Admitting: *Deleted

## 2014-06-04 NOTE — Telephone Encounter (Signed)
Pt is calling to get the results to his blood test that was ran this week. Please call  Thanks

## 2014-06-04 NOTE — Telephone Encounter (Signed)
pt notifiied about lab results with verbal understanding.

## 2014-06-04 NOTE — Telephone Encounter (Signed)
Left message on VM for patient to call back

## 2014-06-04 NOTE — Telephone Encounter (Signed)
lvm with pt's wife to have ptcb to go over lab results. Wife asked if I could cb around 10 am.

## 2014-06-08 NOTE — Telephone Encounter (Signed)
Results discussed w/ patient. See lab note 2/10.

## 2014-06-21 ENCOUNTER — Telehealth: Payer: Self-pay | Admitting: Physician Assistant

## 2014-06-21 DIAGNOSIS — I10 Essential (primary) hypertension: Secondary | ICD-10-CM

## 2014-06-21 MED ORDER — LISINOPRIL 10 MG PO TABS: 10.0000 mg | ORAL_TABLET | Freq: Every day | ORAL | Status: DC

## 2014-06-21 NOTE — Telephone Encounter (Signed)
[phone] Pt is needing a refill on Lisinopril 10 MG [id number] [address], [place] - [place]

## 2014-06-21 NOTE — Telephone Encounter (Signed)
Medication refilled per protocol. 

## 2014-06-24 ENCOUNTER — Other Ambulatory Visit: Payer: Self-pay

## 2014-06-24 MED ORDER — CLOPIDOGREL BISULFATE 75 MG PO TABS: 75.0000 mg | ORAL_TABLET | Freq: Every day | ORAL | Status: DC

## 2014-06-24 NOTE — Telephone Encounter (Signed)
Rx(s) sent to pharmacy electronically.  

## 2014-07-08 ENCOUNTER — Encounter: Payer: Self-pay | Admitting: Cardiology

## 2014-09-13 ENCOUNTER — Encounter: Payer: Self-pay | Admitting: Family Medicine

## 2014-09-13 ENCOUNTER — Telehealth: Payer: Self-pay | Admitting: Physician Assistant

## 2014-09-13 DIAGNOSIS — I10 Essential (primary) hypertension: Secondary | ICD-10-CM

## 2014-09-13 MED ORDER — LISINOPRIL 10 MG PO TABS: 10.0000 mg | ORAL_TABLET | Freq: Every day | ORAL | Status: DC

## 2014-09-13 NOTE — Telephone Encounter (Signed)
Medication refill for one time only.  Patient needs to be seen.  Letter sent for patient to call and schedule 

## 2014-09-13 NOTE — Telephone Encounter (Signed)
[phone]  PT is needing refill on lisinopril (PRINIVIL,ZESTRIL) 10 MG tablet  [id number] [address], [place]

## 2014-10-07 ENCOUNTER — Encounter: Payer: Self-pay | Admitting: Physician Assistant

## 2014-10-07 ENCOUNTER — Ambulatory Visit (INDEPENDENT_AMBULATORY_CARE_PROVIDER_SITE_OTHER): Payer: BLUE CROSS/BLUE SHIELD | Admitting: Physician Assistant

## 2014-10-07 VITALS — BP 108/68 | HR 80 | Temp 97.5°F | Resp 18 | Wt 182.0 lb

## 2014-10-07 DIAGNOSIS — Z125 Encounter for screening for malignant neoplasm of prostate: Secondary | ICD-10-CM

## 2014-10-07 DIAGNOSIS — Z9889 Other specified postprocedural states: Secondary | ICD-10-CM | POA: Diagnosis not present

## 2014-10-07 DIAGNOSIS — I10 Essential (primary) hypertension: Secondary | ICD-10-CM | POA: Diagnosis not present

## 2014-10-07 DIAGNOSIS — Z1212 Encounter for screening for malignant neoplasm of rectum: Secondary | ICD-10-CM

## 2014-10-07 DIAGNOSIS — Z1211 Encounter for screening for malignant neoplasm of colon: Secondary | ICD-10-CM

## 2014-10-07 DIAGNOSIS — I779 Disorder of arteries and arterioles, unspecified: Secondary | ICD-10-CM

## 2014-10-07 DIAGNOSIS — R0989 Other specified symptoms and signs involving the circulatory and respiratory systems: Secondary | ICD-10-CM | POA: Diagnosis not present

## 2014-10-07 DIAGNOSIS — E785 Hyperlipidemia, unspecified: Secondary | ICD-10-CM

## 2014-10-07 DIAGNOSIS — Z87891 Personal history of nicotine dependence: Secondary | ICD-10-CM

## 2014-10-07 DIAGNOSIS — E559 Vitamin D deficiency, unspecified: Secondary | ICD-10-CM | POA: Diagnosis not present

## 2014-10-07 DIAGNOSIS — I739 Peripheral vascular disease, unspecified: Secondary | ICD-10-CM | POA: Diagnosis not present

## 2014-10-07 DIAGNOSIS — Z9862 Peripheral vascular angioplasty status: Secondary | ICD-10-CM

## 2014-10-07 NOTE — Progress Notes (Signed)
Patient ID: Jesus Klein MRN: [id number], DOB: [birth year] 64 y.o. Date of Encounter: 10/07/2014, 10:39 AM    Chief Complaint: F/U HTN, PVD  HPI: 64 y.o. y/o male here for follow-up of above.  He saw me on 02/02/2014 as a new patient to establish care with this office.  He reported that he did live in [place] but recently moved to [place]. He did go to [place] but was changing to this office secondary to location.  At that visit he had complaints of symptoms that were consistent with lower extremity claudication symptoms. Said the only problems/ evaluation  he has ever had with low back pain/sciatica was in 2010. Says at that time he saw [facility] and they checked on his hip to make sure that his symptoms were not secondary to his hip replacement surgery and that they determined that that episode was "sciatica." '  Reviewed in Epic that day that there are no x-rays of his lumbar spine or other x-rays there.  Note: Patient works 3 PM to 3 AM.  Says he usually goes home and goes to sleep and then is awake by 10:00 a.m.    At that visit I did go ahead and repeat any lab work that was indicated given his current medical problems. At that visit also evaluated him for lower extremity claudication and found multiple bruits on exam. Ordered appropriate arterial Dopplers. I also updated preventative care at that visit. See Assessment/Plan section  at the end of this note for the details of this.  Since then he has been seeing Dr. Gwenlyn Found regarding peripheral arterial disease. 03/04/14 he underwent lower extremity angiography and intervention. See Dr. Kennon Holter notes for details. 04/28/14 he underwent carotid artery stent.  Patient has no complaints today. Has continued to have routine follow-up with Dr. Gwenlyn Found. Is taking medications as directed with no adverse effects.   Review of Systems: Consitutional: No fever, chills, fatigue, night sweats,  lymphadenopathy, or weight changes. Eyes: No visual changes, eye redness, or discharge. ENT/Mouth: Ears: No otalgia, tinnitus, hearing loss, discharge. Nose: No congestion, rhinorrhea, sinus pain, or epistaxis. Throat: No sore throat, post nasal drip, or teeth pain. Cardiovascular: No CP, palpitations, diaphoresis, DOE, edema, orthopnea, PND. Respiratory: No cough, hemoptysis, SOB, or wheezing. Gastrointestinal: No anorexia, dysphagia, reflux, pain, nausea, vomiting, hematemesis, diarrhea, constipation, BRBPR, or melena. Genitourinary: No dysuria, frequency, urgency, hematuria, incontinence, nocturia, decreased urinary stream, discharge, impotence, or testicular pain/masses. Musculoskeletal: No decreased ROM, myalgias, stiffness, joint swelling. See HPI for pertinent positives.  Skin: No rash, erythema, lesion changes, pain, warmth, jaundice, or pruritis. Neurological: No headache, dizziness, syncope, seizures, tremors, memory loss, coordination problems, or paresthesias. Psychological: No anxiety, depression, hallucinations, SI/HI. Endocrine: No fatigue, polydipsia, polyphagia, polyuria, or known diabetes. All other systems were reviewed and are otherwise negative.  Past Medical History  Diagnosis Date  . Hypertension 2010  . Hyperlipidemia   . Peripheral arterial disease     a. 02/2014 - right common iliac and external iliac intervention. b. H/o PTA/stenting of left common carotid artery stenosis. Known 80% left subclavian artery stenosis without UE claudication/steal sx.  . Arthritis   . Left-sided carotid artery disease     high-grade ostial left common carotid artery stenosis  . Subclavian artery stenosis, left   . History of tobacco abuse      Past Surgical History  Procedure Laterality Date  . Total hip arthroplasty Right 10/2005  . Joint replacement    . Iliac artery  stent Right 03/04/2014    w/as well as right external iliac and common femoral artery stenting/notes 03/04/2014    . Lower extremity angiogram N/A 03/04/2014    Procedure: LOWER EXTREMITY ANGIOGRAM;  Surgeon: Lorretta Harp, MD;  Location[facility] CATH LAB;  Service: Cardiovascular;  Laterality: N/A;  . Carotid stent insertion N/A 04/28/2014    Procedure: CAROTID STENT INSERTION;  Surgeon: Serafina Mitchell, MD;  Location[facility] CATH LAB;  Service: Cardiovascular;  Laterality: N/A;    Home Meds:  Outpatient Prescriptions Prior to Visit  Medication Sig Dispense Refill  . acetaminophen (TYLENOL) 500 MG tablet Take 1,000 mg by mouth every 6 (six) hours as needed (pain).    Marland Kitchen aspirin EC 81 MG tablet Take 1 tablet (81 mg total) by mouth daily. 90 tablet 3  . atorvastatin (LIPITOR) 80 MG tablet Take 1 tablet (80 mg total) by mouth daily. 90 tablet 3  . cetirizine (ZYRTEC) 10 MG tablet Take 10 mg by mouth daily.    . cholecalciferol (VITAMIN D) 1000 UNITS tablet Take 1,000 Units by mouth daily.    . clopidogrel (PLAVIX) 75 MG tablet Take 1 tablet (75 mg total) by mouth daily with breakfast. 90 tablet 3  . Coenzyme Q10 (EQL COQ10) 300 MG CAPS Take 300 mg by mouth daily.     Marland Kitchen lisinopril (PRINIVIL,ZESTRIL) 10 MG tablet Take 1 tablet (10 mg total) by mouth daily. 30 tablet 0  . Multiple Vitamin (MULTIVITAMIN WITH MINERALS) TABS tablet Take 1 tablet by mouth daily.    . naproxen sodium (ALEVE) 220 MG tablet Take 220 mg by mouth 2 (two) times daily as needed (pain).     . Omega-3 Fatty Acids (FISH OIL TRIPLE STRENGTH) 1400 MG CAPS Take 1,400 mg by mouth every other day.    . polyvinyl alcohol (ARTIFICIAL TEARS) 1.4 % ophthalmic solution Place 1 drop into both eyes daily as needed for dry eyes (allergies).     No facility-administered medications prior to visit.    Allergies: No Known Allergies  History   Social History  . Marital Status: Married    Spouse Name: N/A  . Number of Children: N/A  . Years of Education: N/A   Occupational History  . Not on file.   Social History Main Topics  . Smoking status: Former  Smoker -- 1.50 packs/day for 30 years    Types: Cigarettes    Quit date: 01/19/2009  . Smokeless tobacco: Never Used  . Alcohol Use: 1.2 oz/week    2 Cans of beer per week  . Drug Use: No  . Sexual Activity: Yes    Birth Control/ Protection: None   Other Topics Concern  . 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 He may occasionally lift 20 pounds but most is done by machine.    Mix of standing, sitting, moving around.      Used to walk a lot and do a lot outside. Got a treadmill to use during cold weather.   Married.   2 daughters- grown.          Family History  Problem Relation Age of Onset  . Hyperlipidemia Mother   . Arthritis Father   . COPD Father   . Diabetes Father   . Hypertension Father     Physical Exam: Blood pressure 108/68, pulse 80, temperature 97.5 F (36.4 C), temperature source Oral,  resp. rate 18, weight 182 lb (82.555 kg).  General: Well developed, well nourished,White Male. Appears in no acute distress. Neck: Supple. Trachea midline. No thyromegaly. Full ROM. No lymphadenopathy. He has bilateral carotid bruits left greater than right. Lungs: Clear to auscultation bilaterally without wheezes, rales, or rhonchi. Breathing is of normal effort and unlabored. Cardiovascular: RRR with S1 S2. No murmurs, rubs, or gallops.  Abdomen: Soft, non-tender, non-distended with normoactive bowel sounds. No hepatosplenomegaly or masses. No rebound/guarding. No CVA tenderness. No hernias. Musculoskeletal: Full range of motion and 5/5 strength throughout. Skin: Warm and moist without erythema, ecchymosis, wounds, or rash. Neuro: A+Ox3. CN II-XII grossly intact. Moves all extremities spontaneously. Full sensation throughout. Normal gait.  Psych:  Responds to questions appropriately with a normal affect.   Assessment/Plan:  64 y.o. y/o white male   1. Essential  hypertension Blood pressure at goal. BME T normal 04/29/14.  2. On exam 02/02/2014 I heard a soft bruit at the right renal artery. Given that I am hearing bruits elsewhere, I went ahead and obtained renal artery ultrasound. - US Renal Artery Stenosis; Future---this was performed 02/12/14 and showed no significant renal artery stenosis.  3. Vitamin D deficiency 02/2013 vitamin D level was low at 23.9. Patient states that since that time he has been taking over-the-counter vitamin D 1000 units daily as directed. Vitamin D level rechecked 02/02/14-- on supplement. Recheck vitamin D level today --10/07/14  4. Hyperlipidemia Had FLP/ LFT 06/02/14. LDL 85. Continued on Lipitor 80 mg daily.  5. Claudication of lower extremity This is managed by Dr. Gwenlyn Found.  Had lower extremity angiography and intervention 03/04/2014.  6. Bilateral carotid bruits This is managed by Dr. Gwenlyn Found. He had carotid artery angiography and stent 04/28/2014   7. Preventive Care  A. Screening Labs: 03/11/13 he had:   (these are in Epic)  CBC--Normal CMET---Normal TSH--Normal PSA----Normal Vitamin D--low as above FLP--as above  B. Screening For Prostate Cancer: PSA--normal 03/11/2013 Repeat PSA now 10/07/14  C. Screening For Colorectal Cancer:  At [place] 02/24/2014: "Patient reports he has had one colonoscopy. Says that was at age 7. He states that this was performed at [facility] in [place] but does not recall the name of the Doctor.                                I discussed that even if that colonoscopy was normal, that he should have repeat every 10 years.                                 He is agreeable for me to schedule followup."                                     - Ambulatory referral to [facility] 10/07/14 patient states that he did go to the one office visit with GI but has not yet scheduled a colonoscopy. I wrote on his AVS today to remind him to make sure to follow-up with this.  D.  Immunizations: Influenza Vaccine: Given here 02/24/2014     Tetanus------  He received Tdap 09/02/2010 Pneumococcal--- he has no indication to need pneumonia vaccine until age 40. Zostavax-----At [facility] 02/24/2014--I discusssed--he is to call his insurance to find out the cost. If he is agreeable to  pay this cough and he will call us and we will send an order to the pharmacy.   Office visit 10/07/14 he reports that he was changing insurance companies and then he forgot to follow-up with this.  I  Wrote this on his AVS again today to remind him to follow-up regarding this.   Wrote on his AVS for him to follow-up the colonoscopy and regarding cost of Zostavax. Will recheck vitamin D and PSA today. Given that he is also seeing Dr. Gwenlyn Found routinely, he can wait one year for follow-up office visit here.  F/U sooner if needed   Signed:   [address] [place], [place]  10/07/2014 10:39 AM

## 2014-10-08 LAB — VITAMIN D 25 HYDROXY (VIT D DEFICIENCY, FRACTURES): Vit D, 25-Hydroxy: 30 ng/mL (ref 30–100)

## 2014-10-08 LAB — PSA: PSA: 1.02 ng/mL (ref <=4.00)

## 2014-10-08 NOTE — Addendum Note (Signed)
Addended by: Daylene Posey T on: 10/08/2014 10:23 AM   Modules accepted: Orders, Medications

## 2014-10-08 NOTE — Progress Notes (Signed)
[facility]

## 2014-10-10 ENCOUNTER — Other Ambulatory Visit: Payer: Self-pay | Admitting: Physician Assistant

## 2014-10-11 NOTE — Telephone Encounter (Signed)
Medication refilled per protocol. 

## 2014-11-03 ENCOUNTER — Telehealth (HOSPITAL_COMMUNITY): Payer: Self-pay | Admitting: *Deleted

## 2015-01-13 ENCOUNTER — Telehealth: Payer: Self-pay | Admitting: Physician Assistant

## 2015-01-13 NOTE — Telephone Encounter (Signed)
Pam, I was going through a list of folks to follow up on from earlier this year and I saw Mr. Nienhaus carotid dopplers from 11/2014 had not resulted. It does not look like they got done. Dr. Gwenlyn Found had wanted him to have these 6 months after last study (05/21/14). Can you please look into this and help arrange? Thanks! Dayna Dunn PA-C

## 2015-01-17 NOTE — Telephone Encounter (Signed)
Carotid dopplers have been scheduled for 01/31/15.

## 2015-01-26 ENCOUNTER — Other Ambulatory Visit: Payer: Self-pay | Admitting: Cardiovascular Disease

## 2015-01-26 DIAGNOSIS — I6523 Occlusion and stenosis of bilateral carotid arteries: Secondary | ICD-10-CM

## 2015-01-26 DIAGNOSIS — I739 Peripheral vascular disease, unspecified: Secondary | ICD-10-CM

## 2015-01-26 DIAGNOSIS — I771 Stricture of artery: Secondary | ICD-10-CM

## 2015-01-31 ENCOUNTER — Ambulatory Visit (HOSPITAL_COMMUNITY)
Admission: RE | Admit: 2015-01-31 | Discharge: 2015-01-31 | Disposition: A | Discharge status: Home / Self Care | Payer: BLUE CROSS/BLUE SHIELD | Source: Ambulatory Visit | Attending: Cardiology | Admitting: Cardiology

## 2015-01-31 ENCOUNTER — Ambulatory Visit (HOSPITAL_BASED_OUTPATIENT_CLINIC_OR_DEPARTMENT_OTHER)
Admission: RE | Admit: 2015-01-31 | Discharge: 2015-01-31 | Disposition: A | Discharge status: Home / Self Care | Payer: BLUE CROSS/BLUE SHIELD | Source: Ambulatory Visit | Attending: Cardiology | Admitting: Cardiology

## 2015-01-31 DIAGNOSIS — E785 Hyperlipidemia, unspecified: Secondary | ICD-10-CM | POA: Diagnosis not present

## 2015-01-31 DIAGNOSIS — I6523 Occlusion and stenosis of bilateral carotid arteries: Secondary | ICD-10-CM

## 2015-01-31 DIAGNOSIS — I739 Peripheral vascular disease, unspecified: Secondary | ICD-10-CM | POA: Diagnosis not present

## 2015-01-31 DIAGNOSIS — I10 Essential (primary) hypertension: Secondary | ICD-10-CM | POA: Insufficient documentation

## 2015-01-31 DIAGNOSIS — Z95828 Presence of other vascular implants and grafts: Secondary | ICD-10-CM | POA: Diagnosis not present

## 2015-01-31 DIAGNOSIS — Z87891 Personal history of nicotine dependence: Secondary | ICD-10-CM | POA: Insufficient documentation

## 2015-01-31 DIAGNOSIS — I771 Stricture of artery: Secondary | ICD-10-CM

## 2015-01-31 DIAGNOSIS — I6501 Occlusion and stenosis of right vertebral artery: Secondary | ICD-10-CM | POA: Diagnosis not present

## 2015-02-02 ENCOUNTER — Telehealth: Payer: Self-pay | Admitting: Cardiovascular Disease

## 2015-02-02 DIAGNOSIS — I739 Peripheral vascular disease, unspecified: Principal | ICD-10-CM

## 2015-02-02 DIAGNOSIS — I779 Disorder of arteries and arterioles, unspecified: Secondary | ICD-10-CM

## 2015-02-02 NOTE — Telephone Encounter (Signed)
Patient called and notified of carotid and LEA results. Repeat carotid doppler ordered. Patient scheduled to see Dr. Gwenlyn Found 11/16 for LEA follow up.

## 2015-02-02 NOTE — Telephone Encounter (Signed)
Mr.Bouie is returning a call about his lab results . Please call   Thanks

## 2015-03-09 ENCOUNTER — Ambulatory Visit (INDEPENDENT_AMBULATORY_CARE_PROVIDER_SITE_OTHER): Payer: BLUE CROSS/BLUE SHIELD | Admitting: Cardiovascular Disease

## 2015-03-09 ENCOUNTER — Encounter: Payer: Self-pay | Admitting: Cardiovascular Disease

## 2015-03-09 VITALS — BP 136/80 | HR 100 | Ht 70.0 in | Wt 189.0 lb

## 2015-03-09 DIAGNOSIS — I10 Essential (primary) hypertension: Secondary | ICD-10-CM | POA: Diagnosis not present

## 2015-03-09 DIAGNOSIS — I739 Peripheral vascular disease, unspecified: Secondary | ICD-10-CM | POA: Diagnosis not present

## 2015-03-09 DIAGNOSIS — Z9862 Peripheral vascular angioplasty status: Secondary | ICD-10-CM

## 2015-03-09 DIAGNOSIS — E785 Hyperlipidemia, unspecified: Secondary | ICD-10-CM

## 2015-03-09 NOTE — Progress Notes (Signed)
03/09/2015 Jesus Klein   April 17, 1951  [id number]  Primary Physician Jesus Juba, PA-C Primary Cardiologist: Jesus Harp MD Jesus Klein   HPI:  Jesus Klein is a 64 year-old thin-appearing Caucasian male father of 2 children, grandfather and 2 grandchildren referred by Jesus Juba PA-C for peripheral vascular evaluation.I last saw him in the office 03/31/14.- I initially saw him in the office 02/12/14. He had Dopplers performed in the office today that showed high-grade ostial carotid, ostial/subclavian artery stenosis as well as an occluded left common iliac and high-grade right iliac stenosis. His cardiovascular risk factor profile is notable for a 30-pack-year history of tobacco abuse having stopped smoking 2 years ago as well as 2 hypertension. There is no family history of heart disease. He does have mild hyperlipidemia. He has never had a heart attack or stroke. Denies chest pain or shortness of breath. He did have a right total hip replacement performed by Jesus Klein in 2007. He noticed lifestyle limiting claudication 6 months ago right greater than left. On 03/04/14 I performed aortic arch angiography, selective left carotid angiography, abdominal aortography as well as right common iliac and external iliac intervention. His right lower extremity claudication has completely resolved. His ABI has normalized. He does not notice claudication as much in his left leg. Because of his high grade near ostial left common carotid artery stenosis heunderwent PTCA and stenting on 04/28/14 with a 7 mm x 19 mm long Omnilink balloon expandable stent with excellent result. Follow-up carotid Dopplers performed 01/31/15 revealed his carotids to be widely patent and lower extremity Dopplers revealed his iliac stents to be widely patent as well.  Current Outpatient Prescriptions  Medication Sig Dispense Refill  . acetaminophen (TYLENOL) 500 MG tablet Take 1,000 mg by mouth every 6 (six) hours as  needed (pain).    Marland Kitchen aspirin EC 81 MG tablet Take 1 tablet (81 mg total) by mouth daily. 90 tablet 3  . atorvastatin (LIPITOR) 80 MG tablet Take 1 tablet (80 mg total) by mouth daily. 90 tablet 3  . cetirizine (ZYRTEC) 10 MG tablet Take 10 mg by mouth daily.    . Cholecalciferol (VITAMIN D) 2000 UNITS tablet Take 2,000 Units by mouth daily.    . clopidogrel (PLAVIX) 75 MG tablet Take 1 tablet (75 mg total) by mouth daily with breakfast. 90 tablet 3  . Coenzyme Q10 (EQL COQ10) 300 MG CAPS Take 300 mg by mouth daily.     Marland Kitchen lisinopril (PRINIVIL,ZESTRIL) 10 MG tablet TAKE 1 TABLET (10 MG TOTAL) BY MOUTH DAILY. 90 tablet 3  . Multiple Vitamin (MULTIVITAMIN WITH MINERALS) TABS tablet Take 1 tablet by mouth daily.    . naproxen sodium (ALEVE) 220 MG tablet Take 220 mg by mouth 2 (two) times daily as needed (pain).     . Omega-3 Fatty Acids (FISH OIL TRIPLE STRENGTH) 1400 MG CAPS Take 1,400 mg by mouth every other day.    . polyvinyl alcohol (ARTIFICIAL TEARS) 1.4 % ophthalmic solution Place 1 drop into both eyes daily as needed for dry eyes (allergies).     No current facility-administered medications for this visit.    No Known Allergies  Social History   Social History  . Marital Status: Married    Spouse Name: N/A  . Number of Children: N/A  . Years of Education: N/A   Occupational History  . Not on file.   Social History Main Topics  . Smoking status: Former Smoker -- 1.50 packs/day  for 30 years    Types: Cigarettes    Quit date: 01/19/2009  . Smokeless tobacco: Never Used  . Alcohol Use: 1.2 oz/week    2 Cans of beer per week  . Drug Use: No  . Sexual Activity: Yes    Birth Control/ Protection: None   Other Topics Concern  . 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 He may occasionally lift 20 pounds but most is done by machine.    Mix of standing, sitting, moving around.       Used to walk a lot and do a lot outside. Got a treadmill to use during cold weather.   Married.   2 daughters- grown.           Review of Systems: General: negative for chills, fever, night sweats or weight changes.  Cardiovascular: negative for chest pain, dyspnea on exertion, edema, orthopnea, palpitations, paroxysmal nocturnal dyspnea or shortness of breath Dermatological: negative for rash Respiratory: negative for cough or wheezing Urologic: negative for hematuria Abdominal: negative for nausea, vomiting, diarrhea, bright red blood per rectum, melena, or hematemesis Neurologic: negative for visual changes, syncope, or dizziness All other systems reviewed and are otherwise negative except as noted above.    Blood pressure 136/80, pulse 100, height 5\' 10"  (1.778 m), weight 189 lb (85.73 kg).  General appearance: alert and no distress Neck: no adenopathy, no JVD, supple, symmetrical, trachea midline, thyroid not enlarged, symmetric, no tenderness/mass/nodules and left carotid and subclavian bruit Lungs: clear to auscultation bilaterally Heart: regular rate and rhythm, S1, S2 normal, no murmur, click, rub or gallop Extremities: extremities normal, atraumatic, no cyanosis or edema  EKG sinus tachycardia at 100 without ST or T-wave changes. I personally reviewed this EKG  ASSESSMENT AND PLAN:   S/P RCIA, RCFA, REIA PTA 03/04/14 History of PAD status post angiography and intervention on his right common and external iliac artery 03/04/14 with occluded  Left common iliac artery. He had 3 vessel runoff bilaterally. No other complaints of claudication. His most recent lower extremity Doppler studies  Performed 01/31/15 revealed a right ABI 0.76 and a left of 0.67. His right iliac velocities suggest patent stents.  PVD - Carotid, subclavian, bilat LE disease Jesus Klein underwent PTA and stenting of the origin of his left common carotid artery 04/28/14 using a 7 mm x 19 mm long Omnilink  balloon expandable stent with excellent result. His follow-up Dopplers performed 01/31/15 revealed his carotid widely patent. He denies symptoms of left upper extremity claudication.  Hyperlipidemia History of hyperlipidemia on atorvastatin 80 with recent lipid profile performed 06/02/14 revealed a total cholesterol 153, LDL of 85 and HDL of 38.  Essential hypertension History of hypertension blood pressure measured at 136/80. He is on lisinopril. Continue current meds at current dosing      Jesus Harp MD [facility], [facility] 03/09/2015 10:50 AM

## 2015-03-09 NOTE — Assessment & Plan Note (Signed)
History of PAD status post angiography and intervention on his right common and external iliac artery 03/04/14 with occluded  Left common iliac artery. He had 3 vessel runoff bilaterally. No other complaints of claudication. His most recent lower extremity Doppler studies  Performed 01/31/15 revealed a right ABI 0.76 and a left of 0.67. His right iliac velocities suggest patent stents.

## 2015-03-09 NOTE — Assessment & Plan Note (Signed)
Jesus Klein underwent PTA and stenting of the origin of his left common carotid artery 04/28/14 using a 7 mm x 19 mm long Omnilink balloon expandable stent with excellent result. His follow-up Dopplers performed 01/31/15 revealed his carotid widely patent. He denies symptoms of left upper extremity claudication.

## 2015-03-09 NOTE — Assessment & Plan Note (Signed)
History of hypertension blood pressure measured at 136/80. He is on lisinopril. Continue current meds at current dosing

## 2015-03-09 NOTE — Patient Instructions (Addendum)

## 2015-03-09 NOTE — Assessment & Plan Note (Signed)
History of hyperlipidemia on atorvastatin 80 with recent lipid profile performed 06/02/14 revealed a total cholesterol 153, LDL of 85 and HDL of 38.

## 2015-04-12 IMAGING — CR DG CHEST 2V
2 series · 2 of 2 positions shown · non-contrast
Comparison: None.

CLINICAL DATA: Preoperative prior to a angiogram next week; history
of tobacco use discontinued 4 years ago; history of hypertension and
peripheral vascular disease

EXAM:
CHEST  2 VIEW

[w chest pa]
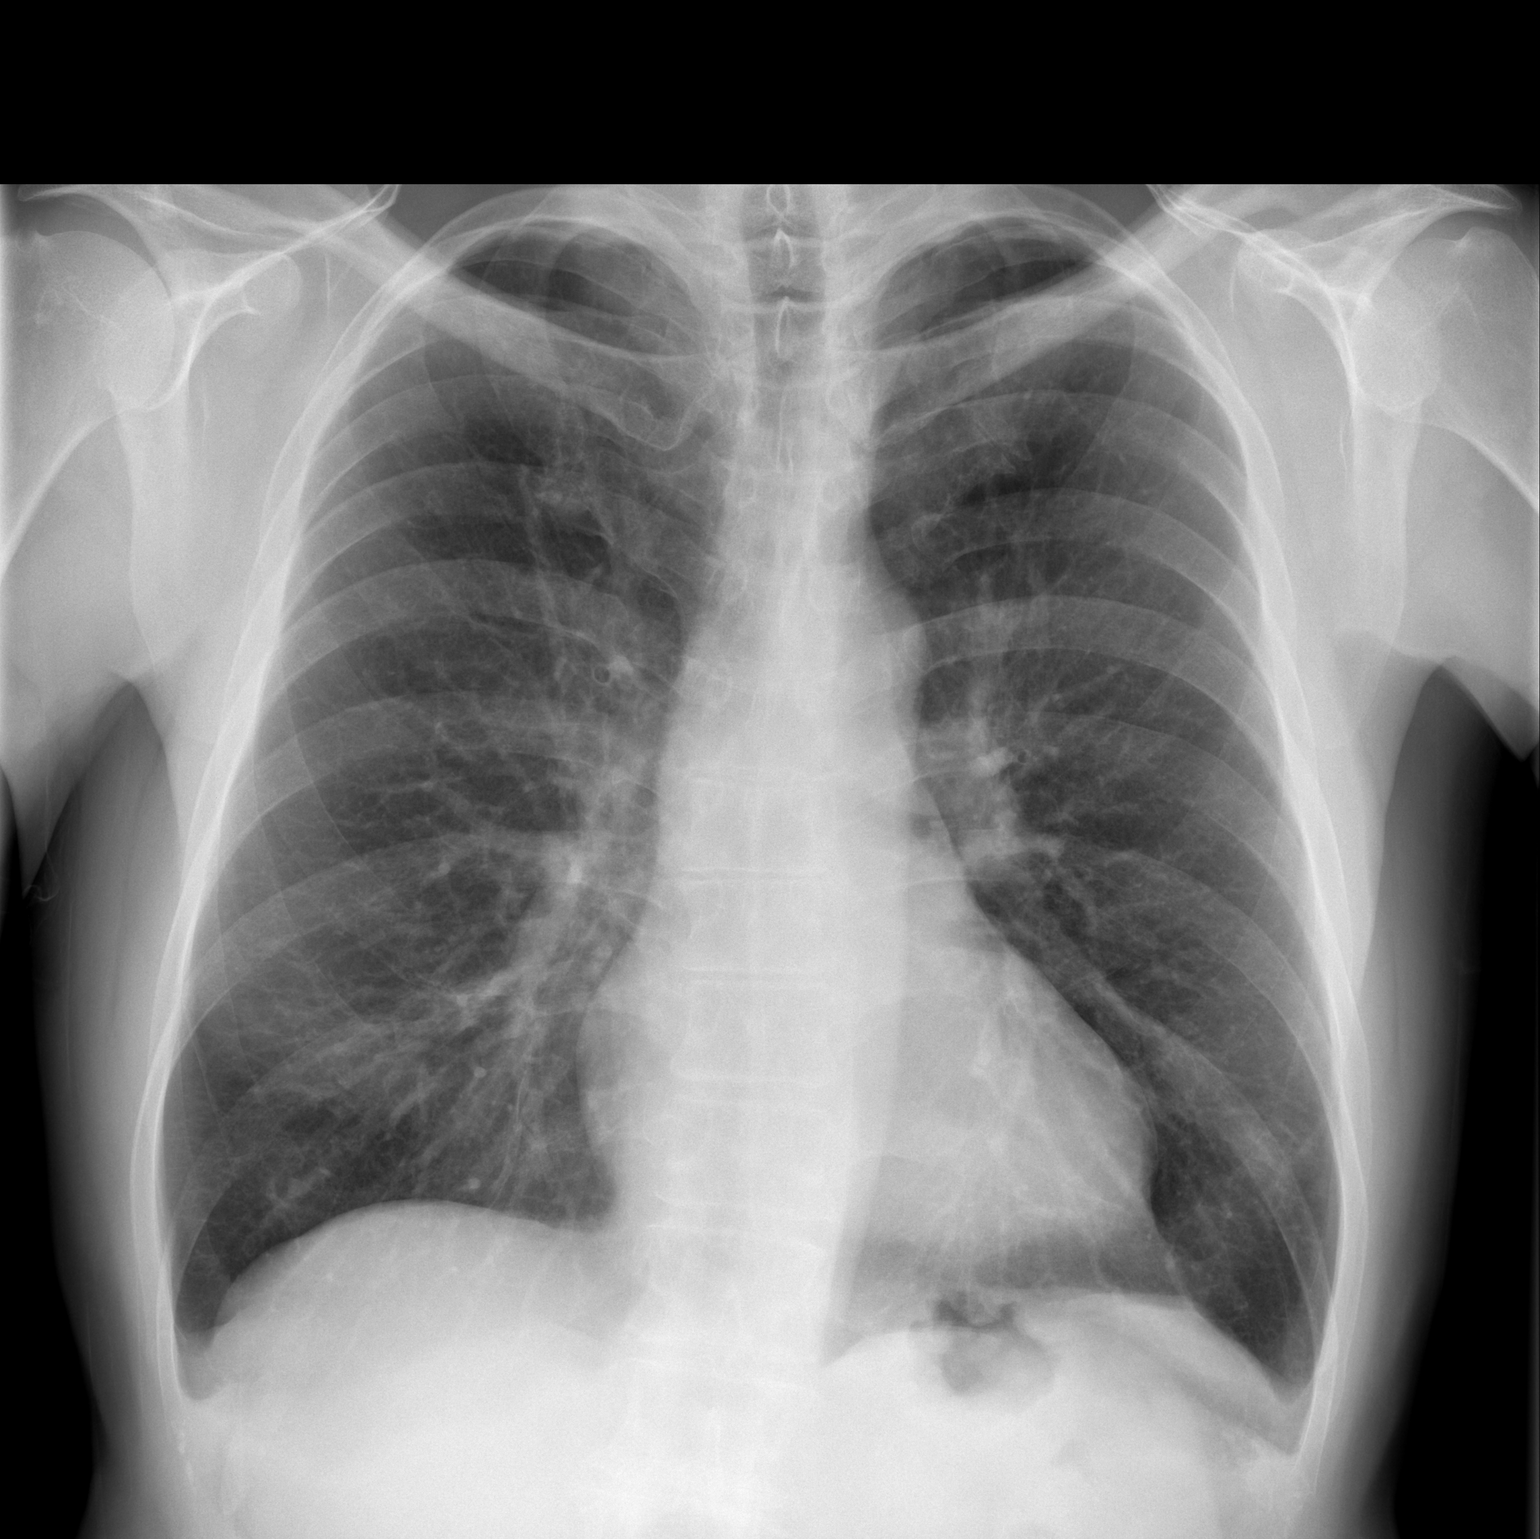

[w chest lat]
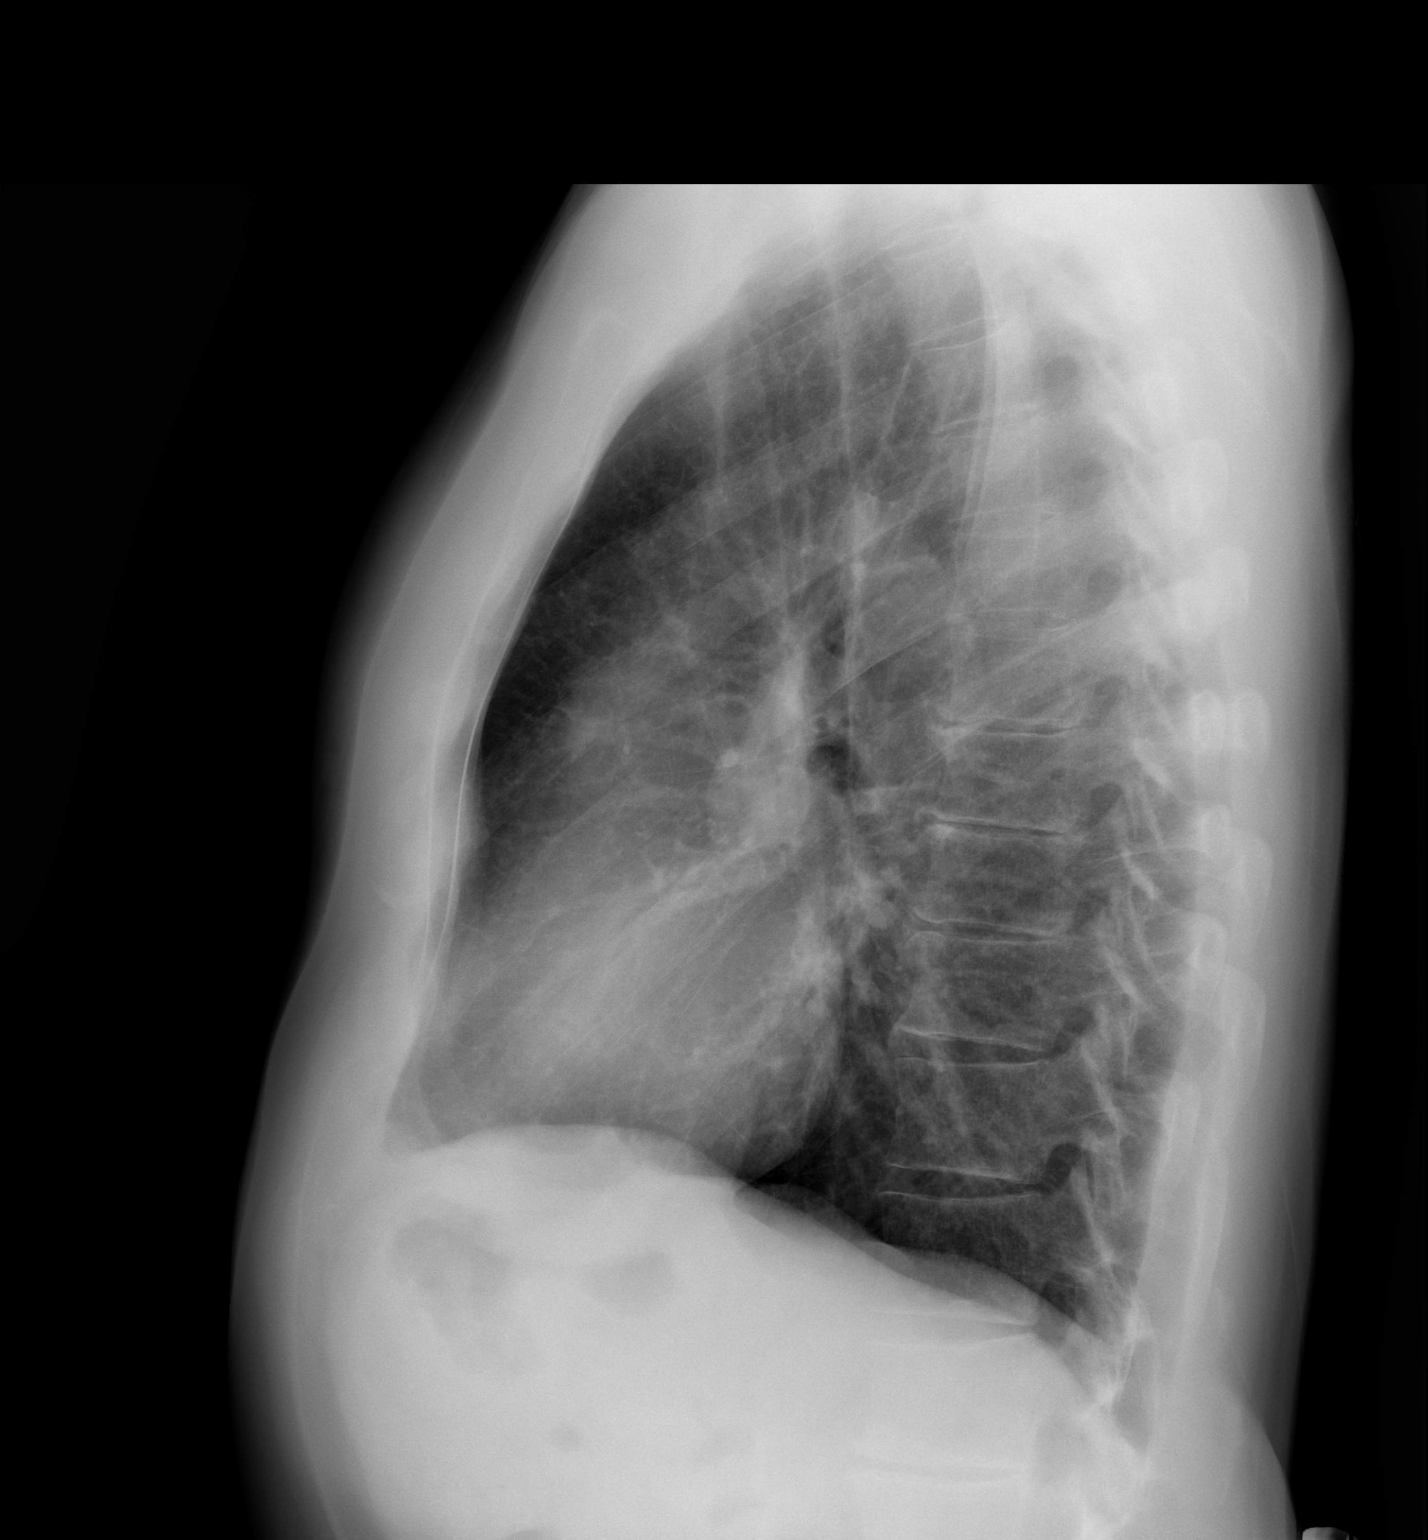

[2 of 2 positions shown; findings below may reference images not displayed]

FINDINGS: The lungs are mildly hyperinflated. There is no focal infiltrate.
The interstitial markings are mildly increased. The heart and
pulmonary vascularity are normal. There is no pleural effusion or
pneumothorax. There is mild degenerative disc space narrowing at
multiple thoracic levels.
IMPRESSION: Mild hyperinflation consistent with COPD. Coarse lung markings may
reflect the patient's smoking history. There is no acute
cardiopulmonary abnormality.

## 2015-06-29 ENCOUNTER — Other Ambulatory Visit: Payer: Self-pay | Admitting: Cardiovascular Disease

## 2015-06-29 NOTE — Telephone Encounter (Signed)
Rx(s) sent to pharmacy electronically.  

## 2015-10-09 ENCOUNTER — Other Ambulatory Visit: Payer: Self-pay | Admitting: Physician Assistant

## 2015-10-11 ENCOUNTER — Encounter: Payer: Self-pay | Admitting: Family Medicine

## 2015-10-11 NOTE — Telephone Encounter (Signed)
Pt has not been seen in over 1 year. Medication refill for one time only.  Patient needs to be seen.  Letter sent for patient to call and schedule

## 2015-11-07 ENCOUNTER — Ambulatory Visit (INDEPENDENT_AMBULATORY_CARE_PROVIDER_SITE_OTHER): Payer: BLUE CROSS/BLUE SHIELD | Admitting: Physician Assistant

## 2015-11-07 ENCOUNTER — Encounter: Payer: Self-pay | Admitting: Physician Assistant

## 2015-11-07 VITALS — BP 130/88 | Temp 97.8°F | Ht 70.0 in | Wt 188.0 lb

## 2015-11-07 DIAGNOSIS — Z23 Encounter for immunization: Secondary | ICD-10-CM | POA: Diagnosis not present

## 2015-11-07 DIAGNOSIS — Z1211 Encounter for screening for malignant neoplasm of colon: Secondary | ICD-10-CM

## 2015-11-07 DIAGNOSIS — Z125 Encounter for screening for malignant neoplasm of prostate: Secondary | ICD-10-CM | POA: Diagnosis not present

## 2015-11-07 DIAGNOSIS — Z0000 Encounter for general adult medical examination without abnormal findings: Secondary | ICD-10-CM | POA: Diagnosis not present

## 2015-11-07 DIAGNOSIS — E559 Vitamin D deficiency, unspecified: Secondary | ICD-10-CM

## 2015-11-07 DIAGNOSIS — R0989 Other specified symptoms and signs involving the circulatory and respiratory systems: Secondary | ICD-10-CM | POA: Diagnosis not present

## 2015-11-07 DIAGNOSIS — Z1212 Encounter for screening for malignant neoplasm of rectum: Secondary | ICD-10-CM | POA: Diagnosis not present

## 2015-11-07 DIAGNOSIS — E785 Hyperlipidemia, unspecified: Secondary | ICD-10-CM

## 2015-11-07 DIAGNOSIS — I779 Disorder of arteries and arterioles, unspecified: Secondary | ICD-10-CM

## 2015-11-07 DIAGNOSIS — Z9862 Peripheral vascular angioplasty status: Secondary | ICD-10-CM | POA: Diagnosis not present

## 2015-11-07 DIAGNOSIS — I739 Peripheral vascular disease, unspecified: Secondary | ICD-10-CM

## 2015-11-07 DIAGNOSIS — I10 Essential (primary) hypertension: Secondary | ICD-10-CM

## 2015-11-07 NOTE — Addendum Note (Signed)
Addended by: Olena Mater on: 11/07/2015 10:27 AM   Modules accepted: Orders

## 2015-11-07 NOTE — Progress Notes (Signed)
Patient ID: Jesus Klein MRN: [id number], DOB: [birth year] 65 y.o. Date of Encounter: 11/07/2015, 8:59 AM    Chief Complaint: CPE  HPI: 65 y.o. y/o male here for CPE.  He saw me on 02/02/2014 as 65 a new patient to establish care with this office.  He reported that he did live in [place] but recently moved to [place]. He did go to [place] but was changing to this office secondary to location.  At that visit he had complaints of symptoms that were consistent with lower extremity claudication symptoms. Said the only problems/ evaluation  he has ever had with low back pain/sciatica was in 2010. Says at that time he saw [facility] and they checked on his hip to make sure that his symptoms were not secondary to his hip replacement surgery and that they determined that that episode was "sciatica." '  Reviewed in Epic that day that there are no x-rays of his lumbar spine or other x-rays there.  Note: Patient works 3 PM to 3 AM.  Says he usually goes home and goes to sleep and then is awake by 10:00 a.m.    At that visit I did go ahead and repeat any lab work that was indicated given his current medical problems. At that visit also evaluated him for lower extremity claudication and found multiple bruits on exam. Ordered appropriate arterial Dopplers. I also updated preventative care at that visit. See Assessment/Plan section  at the end of this note for the details of this.  Since then he has been seeing Dr. Gwenlyn Found regarding peripheral arterial disease. 03/04/14 he underwent lower extremity angiography and intervention. See Dr. Kennon Holter notes for details. 04/28/14 he underwent carotid artery stent.  Patient has no complaints today. Has continued to have routine follow-up with Dr. Gwenlyn Found. Is taking medications as directed with no adverse effects. Taking statin. No myalgias or other adverse effects. Taking blood pressure medications. No lightheadedness or  other adverse effects.   Review of Systems: Consitutional: No fever, chills, fatigue, night sweats, lymphadenopathy, or weight changes. Eyes: No visual changes, eye redness, or discharge. ENT/Mouth: Ears: No otalgia, tinnitus, hearing loss, discharge. Nose: No congestion, rhinorrhea, sinus pain, or epistaxis. Throat: No sore throat, post nasal drip, or teeth pain. Cardiovascular: No CP, palpitations, diaphoresis, DOE, edema, orthopnea, PND. Respiratory: No cough, hemoptysis, SOB, or wheezing. Gastrointestinal: No anorexia, dysphagia, reflux, pain, nausea, vomiting, hematemesis, diarrhea, constipation, BRBPR, or melena. Genitourinary: No dysuria, frequency, urgency, hematuria, incontinence, nocturia, decreased urinary stream, discharge, impotence, or testicular pain/masses. Musculoskeletal: No decreased ROM, myalgias, stiffness, joint swelling. See HPI for pertinent positives.  Skin: No rash, erythema, lesion changes, pain, warmth, jaundice, or pruritis. Neurological: No headache, dizziness, syncope, seizures, tremors, memory loss, coordination problems, or paresthesias. Psychological: No anxiety, depression, hallucinations, SI/HI. Endocrine: No fatigue, polydipsia, polyphagia, polyuria, or known diabetes. All other systems were reviewed and are otherwise negative.  Past Medical History  Diagnosis Date  . Hypertension 2010  . Hyperlipidemia   . Peripheral arterial disease ([place])     a. 02/2014 - right common iliac and external iliac intervention. b. H/o PTA/stenting of left common carotid artery stenosis. Known 80% left subclavian artery stenosis without UE claudication/steal sx.  . Arthritis   . Left-sided carotid artery disease ([facility])     high-grade ostial left common carotid artery stenosis  . Subclavian artery stenosis, left   . History of tobacco abuse      Past Surgical History  Procedure Laterality Date  .  Total hip arthroplasty Right 10/2005  . Joint replacement    . Iliac  artery stent Right 03/04/2014    w/as well as right external iliac and common femoral artery stenting/notes 03/04/2014  . Lower extremity angiogram N/A 03/04/2014    Procedure: LOWER EXTREMITY ANGIOGRAM;  Surgeon: Lorretta Harp, MD;  Location: [facility] CATH LAB;  Service: Cardiovascular;  Laterality: N/A;  . Carotid stent insertion N/A 04/28/2014    Procedure: CAROTID STENT INSERTION;  Surgeon: Serafina Mitchell, MD;  Location: [facility] CATH LAB;  Service: Cardiovascular;  Laterality: N/A;    Home Meds:  Outpatient Prescriptions Prior to Visit  Medication Sig Dispense Refill  . acetaminophen (TYLENOL) 500 MG tablet Take 1,000 mg by mouth every 6 (six) hours as needed (pain).    Marland Kitchen aspirin EC 81 MG tablet Take 1 tablet (81 mg total) by mouth daily. 90 tablet 3  . atorvastatin (LIPITOR) 80 MG tablet Take 1 tablet (80 mg total) by mouth daily. 90 tablet 3  . cetirizine (ZYRTEC) 10 MG tablet Take 10 mg by mouth daily.    . Cholecalciferol (VITAMIN D) 2000 UNITS tablet Take 2,000 Units by mouth daily.    . clopidogrel (PLAVIX) 75 MG tablet TAKE 1 TABLET BY MOUTH EVERY DAY WITH BREAKFAST 90 tablet 2  . Coenzyme Q10 (EQL COQ10) 300 MG CAPS Take 300 mg by mouth daily.     Marland Kitchen lisinopril (PRINIVIL,ZESTRIL) 10 MG tablet TAKE 1 TABLET BY MOUTH DAILY. 30 tablet 0  . Multiple Vitamin (MULTIVITAMIN WITH MINERALS) TABS tablet Take 1 tablet by mouth daily.    . naproxen sodium (ALEVE) 220 MG tablet Take 220 mg by mouth 2 (two) times daily as needed (pain).     . Omega-3 Fatty Acids (FISH OIL TRIPLE STRENGTH) 1400 MG CAPS Take 1,400 mg by mouth every other day.    . polyvinyl alcohol (ARTIFICIAL TEARS) 1.4 % ophthalmic solution Place 1 drop into both eyes daily as needed for dry eyes (allergies).     No facility-administered medications prior to visit.    Allergies: No Known Allergies  Social History   Social History  . Marital Status: Married    Spouse Name: N/A  . Number of Children: N/A  . Years of Education:  N/A   Occupational History  . Not on file.   Social History Main Topics  . Smoking status: Former Smoker -- 1.50 packs/day for 30 years    Types: Cigarettes    Quit date: 01/19/2009  . Smokeless tobacco: Never Used  . Alcohol Use: 1.2 oz/week    2 Cans of beer per week  . Drug Use: No  . Sexual Activity: Yes    Birth Control/ Protection: None   Other Topics Concern  . 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 He may occasionally lift 20 pounds but most is done by machine.    Mix of standing, sitting, moving around.      Used to walk a lot and do a lot outside. Got a treadmill to use during cold weather.   Married.   2 daughters- grown.          Family History  Problem Relation Age of Onset  . Hyperlipidemia Mother   . Arthritis Father   . COPD Father   . Diabetes Father   . Hypertension Father     Physical Exam: Blood pressure 130/88,  temperature 97.8 F (36.6 C), height 5\' 10"  (1.778 m), weight 188 lb (85.276 kg).  General: Well developed, well nourished,White Male. Appears in no acute distress. Neck: Supple. Trachea midline. No thyromegaly. Full ROM. No lymphadenopathy. He has bilateral carotid bruits left greater than right. Lungs: Clear to auscultation bilaterally without wheezes, rales, or rhonchi. Breathing is of normal effort and unlabored. Cardiovascular: RRR with S1 S2. No murmurs, rubs, or gallops.  Abdomen: Soft, non-tender, non-distended with normoactive bowel sounds. No hepatosplenomegaly or masses. No rebound/guarding. No CVA tenderness. No hernias. Musculoskeletal: Full range of motion and 5/5 strength throughout. Skin: Warm and moist without erythema, ecchymosis, wounds, or rash. Neuro: A+Ox3. CN II-XII grossly intact. Moves all extremities spontaneously. Full sensation throughout. Normal gait.  Psych:  Responds to questions appropriately with a normal  affect.   Assessment/Plan:  65 y.o. y/o white male   Essential hypertension Blood pressure at goal. Recheck labs to monitor.  Vitamin D deficiency 02/2013 vitamin D level was low at 23.9. Patient states that since that time he has been taking over-the-counter vitamin D 1000 units daily as directed. Recheck vitamin D level today   Hyperlipidemia On Lipitor 80 mg daily. He is fasting. Recheck labs to monitor.  Claudication of lower extremity This is managed by Dr. Gwenlyn Found.  Had lower extremity angiography and intervention 03/04/2014.  Bilateral carotid bruits This is managed by Dr. Gwenlyn Found. He had carotid artery angiography and stent 04/28/2014   Preventive Care  A. Screening Labs: CBC CMET TSH PSA Vitamin D FLP  B. Screening For Prostate Cancer: PSA--normal 03/11/2013, 10/07/2014 Repeat PSA now  C. Screening For Colorectal Cancer:  At [facility] 02/24/2014: "Patient reports he has had one colonoscopy. Says that was at age 51. He states that this was performed at [facility] in [place] but does not recall the name of the Doctor.                                I discussed that even if that colonoscopy was normal, that he should have repeat every 10 years.                                 He is agreeable for me to schedule followup."                                     - Ambulatory referral to [facility] 10/07/14 patient states that he did go to the one office visit with GI but has not yet scheduled a colonoscopy. AV 10/2015 patient states that he never did follow-up with GI for colonoscopy. However at today's visit he says that he does want to follow-up with this side reorder this again today. I wrote on his AVS today to remind him to make sure to follow-up with this.  D. Immunizations: Influenza Vaccine: Given here 02/24/2014     Tetanus------  He received Tdap 09/02/2010 Pneumococcal--- he has no indication to need pneumonia vaccine until age 59. Zostavax-----At [facility]  02/24/2014--I discusssed--he is to call his insurance to find out the cost. If he is agreeable to pay this cough and he will call us and we will send an order to the pharmacy.   Office visit 10/07/14 he reports that he was [employer] and then he  forgot to follow-up with this.  At [facility] 10/2015 he states that his insurance company doesn't pay for Zostavax and he does want to get Zostavax. Zostavax given here 11/07/2015    Given that he is also seeing Dr. Gwenlyn Found routinely, he can wait one year for follow-up office visit here.  F/U sooner if needed   Signed:   [address] [place], [place]  11/07/2015 8:59 AM

## 2015-11-08 LAB — CBC WITH DIFFERENTIAL/PLATELET
BASOS ABS: 0 {cells}/uL (ref 0–200)
BASOS PCT: 0 %
EOS ABS: 330 {cells}/uL (ref 15–500)
Eosinophils Relative: 6 %
HEMATOCRIT: 49.5 % (ref 38.5–50.0)
Hemoglobin: 16.3 g/dL (ref 13.0–17.0)
Lymphocytes Relative: 44 %
Lymphs Abs: 2420 cells/uL (ref 850–3900)
MCH: 30.4 pg (ref 27.0–33.0)
MCHC: 32.9 g/dL (ref 32.0–36.0)
MCV: 92.4 fL (ref 80.0–100.0)
MONO ABS: 605 {cells}/uL (ref 200–950)
MONOS PCT: 11 %
MPV: 9.4 fL (ref 7.5–12.5)
NEUTROS ABS: 2145 {cells}/uL (ref 1500–7800)
Neutrophils Relative %: 39 %
PLATELETS: 285 10*3/uL (ref 140–400)
RBC: 5.36 MIL/uL (ref 4.20–5.80)
RDW: 13.5 % (ref 11.0–15.0)
WBC: 5.5 10*3/uL (ref 3.8–10.8)

## 2015-11-08 LAB — VITAMIN D 25 HYDROXY (VIT D DEFICIENCY, FRACTURES): Vit D, 25-Hydroxy: 36 ng/mL (ref 30–100)

## 2015-11-08 LAB — COMPLETE METABOLIC PANEL WITH GFR
ALBUMIN: 4.5 g/dL (ref 3.6–5.1)
ALK PHOS: 59 U/L (ref 40–115)
ALT: 16 U/L (ref 9–46)
AST: 16 U/L (ref 10–35)
BILIRUBIN TOTAL: 0.5 mg/dL (ref 0.2–1.2)
BUN: 14 mg/dL (ref 7–25)
CALCIUM: 9.6 mg/dL (ref 8.6–10.3)
CO2: 26 mmol/L (ref 20–31)
CREATININE: 0.86 mg/dL (ref 0.70–1.25)
Chloride: 102 mmol/L (ref 98–110)
Glucose, Bld: 80 mg/dL (ref 70–99)
Potassium: 4.2 mmol/L (ref 3.5–5.3)
Sodium: 138 mmol/L (ref 135–146)
TOTAL PROTEIN: 7.1 g/dL (ref 6.1–8.1)

## 2015-11-08 LAB — LIPID PANEL
CHOLESTEROL: 239 mg/dL — AB (ref 125–200)
HDL: 44 mg/dL (ref >=40)
LDL CALC: 155 mg/dL — AB (ref <130)
TRIGLYCERIDES: 202 mg/dL — AB (ref <150)
Total CHOL/HDL Ratio: 5.4 Ratio — ABNORMAL HIGH (ref <=5.0)
VLDL: 40 mg/dL — ABNORMAL HIGH (ref <30)

## 2015-11-08 LAB — TSH: TSH: 1.24 mIU/L (ref 0.40–4.50)

## 2015-11-08 LAB — PSA: PSA: 1.21 ng/mL (ref <=4.00)

## 2015-11-09 ENCOUNTER — Telehealth: Payer: Self-pay | Admitting: Family Medicine

## 2015-11-09 NOTE — Telephone Encounter (Signed)
Pt called back and states that he has been off his Lipitor since December. He also states that he is getting ready to go to work and we could call him back in the morning after 10:00

## 2015-11-09 NOTE — Telephone Encounter (Signed)
Why has he been off the Lipitor?  If he was having adverse effects, let me know.  Otherwise, he NEEDS to be on Lipitor. He has had blockages in multiple arteries and this is going to re-occur if he does not take cholesterol medication.

## 2015-11-10 MED ORDER — ATORVASTATIN CALCIUM 80 MG PO TABS: 80.0000 mg | ORAL_TABLET | Freq: Every day | ORAL | Status: DC

## 2015-11-10 NOTE — Telephone Encounter (Signed)
lmtrc

## 2015-11-10 NOTE — Telephone Encounter (Signed)
Pt states that med could have caused his leg pain but it did not get much better after stopping it so he is willing to go back on it. Med sent to pharm and pt aware to recheck in 3 months.

## 2015-11-15 ENCOUNTER — Encounter: Payer: Self-pay | Admitting: Internal Medicine

## 2015-12-07 ENCOUNTER — Other Ambulatory Visit: Payer: Self-pay | Admitting: Physician Assistant

## 2015-12-07 NOTE — Telephone Encounter (Signed)
Medication refilled per protocol. 

## 2016-01-23 ENCOUNTER — Ambulatory Visit: Payer: BLUE CROSS/BLUE SHIELD | Admitting: Internal Medicine

## 2016-02-16 ENCOUNTER — Encounter (HOSPITAL_COMMUNITY): Payer: Self-pay | Admitting: Cardiovascular Disease

## 2016-03-07 ENCOUNTER — Encounter (HOSPITAL_COMMUNITY): Payer: Self-pay | Admitting: Cardiovascular Disease

## 2016-03-12 ENCOUNTER — Ambulatory Visit: Payer: BLUE CROSS/BLUE SHIELD | Admitting: Internal Medicine

## 2016-03-26 ENCOUNTER — Other Ambulatory Visit: Payer: Self-pay | Admitting: Cardiovascular Disease

## 2016-04-12 ENCOUNTER — Other Ambulatory Visit: Payer: Self-pay | Admitting: Cardiovascular Disease

## 2016-04-12 DIAGNOSIS — I739 Peripheral vascular disease, unspecified: Secondary | ICD-10-CM

## 2016-04-25 ENCOUNTER — Encounter: Payer: Self-pay | Admitting: Cardiology

## 2016-04-27 ENCOUNTER — Telehealth: Payer: Self-pay | Admitting: Cardiovascular Disease

## 2016-04-27 NOTE — Telephone Encounter (Signed)
Pt now has [employer].  Insurance added.  Pt will bring card to visit

## 2016-04-27 NOTE — Telephone Encounter (Signed)
Jesus Klein is returning your call. Thanks.

## 2016-05-01 ENCOUNTER — Ambulatory Visit (HOSPITAL_COMMUNITY)
Admission: RE | Admit: 2016-05-01 | Discharge: 2016-05-01 | Disposition: A | Discharge status: Home / Self Care | Payer: Managed Care, Other (non HMO) | Source: Ambulatory Visit | Attending: Cardiology | Admitting: Cardiology

## 2016-05-01 DIAGNOSIS — Z95820 Peripheral vascular angioplasty status with implants and grafts: Secondary | ICD-10-CM | POA: Insufficient documentation

## 2016-05-01 DIAGNOSIS — I739 Peripheral vascular disease, unspecified: Secondary | ICD-10-CM | POA: Diagnosis not present

## 2016-05-01 DIAGNOSIS — R9439 Abnormal result of other cardiovascular function study: Secondary | ICD-10-CM | POA: Diagnosis not present

## 2016-05-01 DIAGNOSIS — I745 Embolism and thrombosis of iliac artery: Secondary | ICD-10-CM | POA: Insufficient documentation

## 2016-05-01 DIAGNOSIS — Z9889 Other specified postprocedural states: Secondary | ICD-10-CM | POA: Diagnosis not present

## 2016-05-01 DIAGNOSIS — I700 Atherosclerosis of aorta: Secondary | ICD-10-CM | POA: Diagnosis not present

## 2016-05-04 ENCOUNTER — Other Ambulatory Visit: Payer: Self-pay | Admitting: Cardiovascular Disease

## 2016-05-04 DIAGNOSIS — I739 Peripheral vascular disease, unspecified: Secondary | ICD-10-CM

## 2016-05-21 ENCOUNTER — Ambulatory Visit (INDEPENDENT_AMBULATORY_CARE_PROVIDER_SITE_OTHER): Payer: Managed Care, Other (non HMO) | Admitting: Internal Medicine

## 2016-05-21 ENCOUNTER — Encounter: Payer: Self-pay | Admitting: Internal Medicine

## 2016-05-21 ENCOUNTER — Telehealth: Payer: Self-pay

## 2016-05-21 VITALS — BP 100/72 | HR 88 | Ht 71.0 in | Wt 184.0 lb

## 2016-05-21 DIAGNOSIS — Z7902 Long term (current) use of antithrombotics/antiplatelets: Secondary | ICD-10-CM | POA: Diagnosis not present

## 2016-05-21 DIAGNOSIS — Z5181 Encounter for therapeutic drug level monitoring: Secondary | ICD-10-CM

## 2016-05-21 DIAGNOSIS — Z1211 Encounter for screening for malignant neoplasm of colon: Secondary | ICD-10-CM

## 2016-05-21 MED ORDER — NA SULFATE-K SULFATE-MG SULF 17.5-3.13-1.6 GM/177ML PO SOLN: 1.0000 | Freq: Once | ORAL | 0 refills | Status: AC

## 2016-05-21 NOTE — Telephone Encounter (Signed)
  05/21/2016   RE: Jesus Klein DOB: 13-Apr-1951 MRN: [id number]   Dear  Dr. Gwenlyn Found,    We have scheduled the above patient for an endoscopic procedure. Our records show that he is on anticoagulation therapy.   Please advise as to how long the patient may come off his therapy of Plavix prior to the procedure, which is scheduled for 06/29/2016.  Please fax back/ or route the completed form to [facility] at [phone].   Sincerely,    Phillis Haggis

## 2016-05-21 NOTE — Patient Instructions (Signed)

## 2016-05-21 NOTE — Progress Notes (Signed)
HISTORY OF PRESENT ILLNESS:  Jesus Klein is a 66 y.o. male who is sent today by his primary care provider Dena Billet PA-C with a chief complaint of eating screening colonoscopy and on chronic Plavix therapy. Patient has not had prior colon cancer screening. No family history of colon cancer. GI review of systems is remarkable for bloating and occasional alternating bowel habits which are long-standing and unchanged. He denies a family history of colon cancer. No rectal bleeding. He has been on Plavix for approximate 5 years after having had carotid artery and lower extremity vascular stenting with Dr. Quay Burow. No prior history of stroke.  REVIEW OF SYSTEMS:  All non-GI ROS negative except for arthritis  Past Medical History:  Diagnosis Date  . Arthritis   . History of tobacco abuse   . Hyperlipidemia   . Hypertension 2010  . Left-sided carotid artery disease ([facility])    high-grade ostial left common carotid artery stenosis  . Peripheral arterial disease ([place])    a. 02/2014 - right common iliac and external iliac intervention. b. H/o PTA/stenting of left common carotid artery stenosis. Known 80% left subclavian artery stenosis without UE claudication/steal sx.  . Subclavian artery stenosis, left [facility])     Past Surgical History:  Procedure Laterality Date  . CAROTID STENT INSERTION N/A 04/28/2014   Procedure: CAROTID STENT INSERTION;  Surgeon: Serafina Mitchell, MD;  Location: [facility] CATH LAB;  Service: Cardiovascular;  Laterality: N/A;  . ILIAC ARTERY STENT Right 03/04/2014   w/as well as right external iliac and common femoral artery stenting/notes 03/04/2014  . JOINT REPLACEMENT    . LOWER EXTREMITY ANGIOGRAM N/A 03/04/2014   Procedure: LOWER EXTREMITY ANGIOGRAM;  Surgeon: Lorretta Harp, MD;  Location: [facility] CATH LAB;  Service: Cardiovascular;  Laterality: N/A;  . TOTAL HIP ARTHROPLASTY Right 10/2005    Social History BRYSYN HOWREN  reports that he quit smoking about 7 years ago. His  smoking use included Cigarettes. He has a 45.00 pack-year smoking history. He has never used smokeless tobacco. He reports that he drinks about 1.2 oz of alcohol per week . He reports that he does not use drugs.  family history includes Arthritis in his father; COPD in his father; Colitis in his mother; Diabetes in his father and mother; Hyperlipidemia in his mother; Hypertension in his father.  No Known Allergies     PHYSICAL EXAMINATION: Vital signs: BP 100/72   Pulse 88   Ht 5\' 11"  (1.803 m)   Wt 184 lb (83.5 kg)   BMI 25.66 kg/m   Constitutional: generally well-appearing, no acute distress Psychiatric: alert and oriented x3, cooperative Eyes: extraocular movements intact, anicteric, conjunctiva pink Mouth: oral pharynx moist, no lesions Neck: supple no lymphadenopathy Cardiovascular: heart regular rate and rhythm, no murmur Lungs: clear to auscultation bilaterally Abdomen: soft, nontender, nondistended, no obvious ascites, no peritoneal signs, normal bowel sounds, no organomegaly Rectal: Deferred until colonoscopy Extremities: no clubbing cyanosis or lower extremity edema bilaterally Skin: no lesions on visible extremities Neuro: No focal deficits. Cranial nerves intact  ASSESSMENT:  #1. Colon cancer screening. Average risk for colon cancer. Appropriate candidate without contraindication. High-risk due to vascular disease and the need for chronic Plavix #2. Carotid artery and peripheral vascular disease status post remote stenting on chronic Plavix   PLAN:  #1. Colonoscopy.The nature of the procedure, as well as the risks, benefits, and alternatives were carefully and thoroughly reviewed with the patient. Ample time for discussion and questions allowed. The patient  understood, was satisfied, and agreed to proceed. #2. Would hold Plavix 1 week prior to procedure to reduce the risk of procedural related bleeding. We'll check with Dr. Gwenlyn Found to see if this is acceptable. I did  recommend that the patient started baby aspirin 3 days prior to discontinuing Plavix and continuing this through the procedure for some antiplatelet effect. He understood  A copy of this consultation note has been sent to [facility] PA-C

## 2016-05-22 NOTE — Telephone Encounter (Signed)
Okay to interrupt his antiplatelet therapy for his endoscopic procedure. Can stop Plavix 1 week prior to the procedure

## 2016-05-23 NOTE — Telephone Encounter (Signed)
Left message that patient can hold his Plavix for one week prior to procedure.  Asked for return phone call confirming understanding.

## 2016-05-23 NOTE — Telephone Encounter (Signed)
Patient verbalized understanding regarding Plavix

## 2016-05-23 NOTE — Telephone Encounter (Signed)
Patient returning phone call to Magda Paganini states he understands he can hold Plavix for one week prior to procedure.

## 2016-06-15 ENCOUNTER — Encounter: Payer: Self-pay | Admitting: Internal Medicine

## 2016-06-29 ENCOUNTER — Encounter: Payer: Self-pay | Admitting: Internal Medicine

## 2016-06-29 ENCOUNTER — Ambulatory Visit (AMBULATORY_SURGERY_CENTER): Payer: Managed Care, Other (non HMO) | Admitting: Internal Medicine

## 2016-06-29 VITALS — BP 110/59 | HR 69 | Temp 98.0°F | Resp 14 | Ht 71.0 in | Wt 184.0 lb

## 2016-06-29 DIAGNOSIS — D123 Benign neoplasm of transverse colon: Secondary | ICD-10-CM

## 2016-06-29 DIAGNOSIS — D122 Benign neoplasm of ascending colon: Secondary | ICD-10-CM

## 2016-06-29 DIAGNOSIS — K635 Polyp of colon: Secondary | ICD-10-CM

## 2016-06-29 DIAGNOSIS — Z1212 Encounter for screening for malignant neoplasm of rectum: Secondary | ICD-10-CM

## 2016-06-29 DIAGNOSIS — D125 Benign neoplasm of sigmoid colon: Secondary | ICD-10-CM | POA: Diagnosis not present

## 2016-06-29 DIAGNOSIS — Z1211 Encounter for screening for malignant neoplasm of colon: Secondary | ICD-10-CM | POA: Diagnosis not present

## 2016-06-29 MED ORDER — SODIUM CHLORIDE 0.9 % IV SOLN: 500.0000 mL | INTRAVENOUS | Status: DC

## 2016-06-29 NOTE — Patient Instructions (Signed)
YOU HAD AN ENDOSCOPIC PROCEDURE TODAY AT [facility]:   Refer to the procedure report that was given to you for any specific questions about what was found during the examination.  If the procedure report does not answer your questions, please call your gastroenterologist to clarify.  If you requested that your care partner not be given the details of your procedure findings, then the procedure report has been included in a sealed envelope for you to review at your convenience later.  YOU SHOULD EXPECT: Some feelings of bloating in the abdomen. Passage of more gas than usual.  Walking can help get rid of the air that was put into your GI tract during the procedure and reduce the bloating. If you had a lower endoscopy (such as a colonoscopy or flexible sigmoidoscopy) you may notice spotting of blood in your stool or on the toilet paper. If you underwent a bowel prep for your procedure, you may not have a normal bowel movement for a few days.  Please Note:  You might notice some irritation and congestion in your nose or some drainage.  This is from the oxygen used during your procedure.  There is no need for concern and it should clear up in a day or so.  SYMPTOMS TO REPORT IMMEDIATELY:   Following lower endoscopy (colonoscopy or flexible sigmoidoscopy):  Excessive amounts of blood in the stool  Significant tenderness or worsening of abdominal pains  Swelling of the abdomen that is new, acute  Fever of 100F or higher  For urgent or emergent issues, a gastroenterologist can be reached at any hour by calling [phone].   DIET:  We do recommend a small meal at first, but then you may proceed to your regular diet.  Drink plenty of fluids but you should avoid alcoholic beverages for 24 hours.  ACTIVITY:  You should plan to take it easy for the rest of today and you should NOT DRIVE or use heavy machinery until tomorrow (because of the sedation medicines used during the test).     FOLLOW UP: Our staff will call the number listed on your records the next business day following your procedure to check on you and address any questions or concerns that you may have regarding the information given to you following your procedure. If we do not reach you, we will leave a message.  However, if you are feeling well and you are not experiencing any problems, there is no need to return our call.  We will assume that you have returned to your regular daily activities without incident.  If any biopsies were taken you will be contacted by phone or by letter within the next 1-3 weeks.  Please call us at [phone] if you have not heard about the biopsies in 3 weeks.   Await for biopsy results to determined next repeat colonoscopy screening Polyps (handout given) Resume Plavix today  SIGNATURES/CONFIDENTIALITY: You and/or your care partner have signed paperwork which will be entered into your electronic medical record.  These signatures attest to the fact that that the information above on your After Visit Summary has been reviewed and is understood.  Full responsibility of the confidentiality of this discharge information lies with you and/or your care-partner.

## 2016-06-29 NOTE — Progress Notes (Signed)
Called to room to assist during endoscopic procedure.  Patient ID and intended procedure confirmed with present staff. Received instructions for my participation in the procedure from the performing physician.  

## 2016-06-29 NOTE — Progress Notes (Signed)
To PACU, vss patent aw report to rn 

## 2016-06-29 NOTE — Op Note (Signed)
[facility] Patient Name: Jesus Klein Procedure Date: 06/29/2016 4:02 PM MRN: [id number] Endoscopist: Docia Chuck. Henrene Pastor , MD Age: 66 Referring MD:  Date of Birth: 1951-02-29 Gender: Male Account #: [id number] Procedure:                Colonoscopy, with cold snare polypectomy X 6 Indications:              Screening for colorectal malignant neoplasm Medicines:                Monitored Anesthesia Care Procedure:                Pre-Anesthesia Assessment:                           - Prior to the procedure, a History and Physical                            was performed, and patient medications and                            allergies were reviewed. The patient's tolerance of                            previous anesthesia was also reviewed. The risks                            and benefits of the procedure and the sedation                            options and risks were discussed with the patient.                            All questions were answered, and informed consent                            was obtained. Prior Anticoagulants: The patient has                            taken Plavix (clopidogrel), last dose was 7 days                            prior to procedure. ASA Grade Assessment: II - A                            patient with mild systemic disease. After reviewing                            the risks and benefits, the patient was deemed in                            satisfactory condition to undergo the procedure.                           After obtaining informed consent, the colonoscope  was passed under direct vision. Throughout the                            procedure, the patient's blood pressure, pulse, and                            oxygen saturations were monitored continuously. The                            Colonoscope was introduced through the anus and                            advanced to the the cecum, identified by              appendiceal orifice and ileocecal valve. The                            ileocecal valve, appendiceal orifice, and rectum                            were photographed. The quality of the bowel                            preparation was excellent. The colonoscopy was                            performed without difficulty. The patient tolerated                            the procedure well. The bowel preparation used was                            SUPREP. Scope In: 4:10:51 PM Scope Out: 4:32:11 PM Scope Withdrawal Time: 0 hours 17 minutes 58 seconds  Total Procedure Duration: 0 hours 21 minutes 20 seconds  Findings:                 Six polyps were found in the sigmoid colon,                            transverse colon and ascending colon. The polyps                            were 2 to 5 mm in size. These polyps were removed                            with a cold snare. Resection and retrieval were                            complete.                           The exam was otherwise without abnormality on  direct and retroflexion views. Complications:            No immediate complications. Estimated blood loss:                            None. Estimated Blood Loss:     Estimated blood loss: none. Impression:               - Six 2 to 5 mm polyps in the sigmoid colon, in the                            transverse colon and in the ascending colon,                            removed with a cold snare. Resected and retrieved.                           - The examination was otherwise normal on direct                            and retroflexion views. Recommendation:           - Repeat colonoscopy in 3 years for surveillancei                            (multiple polyps).                           - Patient has a contact number available for                            emergencies. The signs and symptoms of potential                            delayed  complications were discussed with the                            patient. Return to normal activities tomorrow.                            Written discharge instructions were provided to the                            patient.                           - Resume Plavix today.                           - Continue present medications.                           - Await pathology results. Docia Chuck. Henrene Pastor, MD 06/29/2016 4:37:30 PM This report has been signed electronically.

## 2016-07-02 ENCOUNTER — Telehealth: Payer: Self-pay | Admitting: *Deleted

## 2016-07-02 NOTE — Telephone Encounter (Signed)
Left message on f/u call 

## 2016-07-03 ENCOUNTER — Telehealth: Payer: Self-pay | Admitting: *Deleted

## 2016-07-03 NOTE — Telephone Encounter (Signed)
  Follow up Call-  Call back number 06/29/2016  Post procedure Call Back phone  # [phone]  Permission to leave phone message Yes  Some recent data might be hidden    [facility]

## 2016-07-06 ENCOUNTER — Encounter: Payer: Self-pay | Admitting: Internal Medicine

## 2016-10-26 ENCOUNTER — Other Ambulatory Visit: Payer: Self-pay | Admitting: Physician Assistant

## 2016-10-26 NOTE — Telephone Encounter (Signed)
Refill appropriate 

## 2016-11-02 ENCOUNTER — Other Ambulatory Visit: Payer: Self-pay | Admitting: Physician Assistant

## 2016-11-02 NOTE — Telephone Encounter (Signed)
Rx filled per protocol  

## 2016-11-29 ENCOUNTER — Encounter: Payer: Self-pay | Admitting: Physician Assistant

## 2016-11-29 ENCOUNTER — Ambulatory Visit (INDEPENDENT_AMBULATORY_CARE_PROVIDER_SITE_OTHER): Payer: Managed Care, Other (non HMO) | Admitting: Physician Assistant

## 2016-11-29 VITALS — BP 110/62 | HR 68 | Temp 97.6°F | Resp 16 | Ht 71.0 in | Wt 190.4 lb

## 2016-11-29 DIAGNOSIS — Z125 Encounter for screening for malignant neoplasm of prostate: Secondary | ICD-10-CM | POA: Diagnosis not present

## 2016-11-29 DIAGNOSIS — I739 Peripheral vascular disease, unspecified: Secondary | ICD-10-CM

## 2016-11-29 DIAGNOSIS — E559 Vitamin D deficiency, unspecified: Secondary | ICD-10-CM | POA: Diagnosis not present

## 2016-11-29 DIAGNOSIS — Z0000 Encounter for general adult medical examination without abnormal findings: Secondary | ICD-10-CM | POA: Diagnosis not present

## 2016-11-29 DIAGNOSIS — R0989 Other specified symptoms and signs involving the circulatory and respiratory systems: Secondary | ICD-10-CM

## 2016-11-29 DIAGNOSIS — E785 Hyperlipidemia, unspecified: Secondary | ICD-10-CM

## 2016-11-29 DIAGNOSIS — I10 Essential (primary) hypertension: Secondary | ICD-10-CM | POA: Diagnosis not present

## 2016-11-29 DIAGNOSIS — Z9862 Peripheral vascular angioplasty status: Secondary | ICD-10-CM

## 2016-11-29 DIAGNOSIS — I779 Disorder of arteries and arterioles, unspecified: Secondary | ICD-10-CM | POA: Diagnosis not present

## 2016-11-29 LAB — LIPID PANEL
Cholesterol: 134 mg/dL (ref <200)
HDL: 44 mg/dL (ref >40)
LDL Cholesterol: 67 mg/dL (ref <100)
Total CHOL/HDL Ratio: 3 Ratio (ref <5.0)
Triglycerides: 114 mg/dL (ref <150)
VLDL: 23 mg/dL (ref <30)

## 2016-11-29 LAB — CBC WITH DIFFERENTIAL/PLATELET
BASOS ABS: 51 {cells}/uL (ref 0–200)
Basophils Relative: 1 %
EOS PCT: 8 %
Eosinophils Absolute: 408 cells/uL (ref 15–500)
HEMATOCRIT: 46.8 % (ref 38.5–50.0)
HEMOGLOBIN: 15.2 g/dL (ref 13.0–17.0)
LYMPHS ABS: 2142 {cells}/uL (ref 850–3900)
Lymphocytes Relative: 42 %
MCH: 29.2 pg (ref 27.0–33.0)
MCHC: 32.5 g/dL (ref 32.0–36.0)
MCV: 90 fL (ref 80.0–100.0)
MONO ABS: 612 {cells}/uL (ref 200–950)
MPV: 9.3 fL (ref 7.5–12.5)
Monocytes Relative: 12 %
NEUTROS ABS: 1887 {cells}/uL (ref 1500–7800)
Neutrophils Relative %: 37 %
Platelets: 282 10*3/uL (ref 140–400)
RBC: 5.2 MIL/uL (ref 4.20–5.80)
RDW: 14.2 % (ref 11.0–15.0)
WBC: 5.1 10*3/uL (ref 3.8–10.8)

## 2016-11-29 LAB — COMPLETE METABOLIC PANEL WITH GFR
ALBUMIN: 4.3 g/dL (ref 3.6–5.1)
ALK PHOS: 82 U/L (ref 40–115)
ALT: 22 U/L (ref 9–46)
AST: 20 U/L (ref 10–35)
BILIRUBIN TOTAL: 0.6 mg/dL (ref 0.2–1.2)
BUN: 13 mg/dL (ref 7–25)
CO2: 26 mmol/L (ref 20–32)
Calcium: 9.6 mg/dL (ref 8.6–10.3)
Chloride: 104 mmol/L (ref 98–110)
Creat: 0.88 mg/dL (ref 0.70–1.25)
GFR, Est African American: >89 mL/min (ref >=60)
GFR, Est Non African American: >89 mL/min (ref >=60)
GLUCOSE: 78 mg/dL (ref 70–99)
Potassium: 4.2 mmol/L (ref 3.5–5.3)
SODIUM: 139 mmol/L (ref 135–146)
TOTAL PROTEIN: 6.6 g/dL (ref 6.1–8.1)

## 2016-11-29 LAB — TSH: TSH: 1.35 mIU/L (ref 0.40–4.50)

## 2016-11-29 NOTE — Progress Notes (Signed)
Patient ID: Jesus Klein MRN: [id number], DOB: 1950/07/13 66 y.o. Date of Encounter: 11/29/2016, 10:45 AM    Chief Complaint: CPE  HPI: 66 y.o. y/o male here for CPE.  He saw me on 02/02/2014 as a new patient to establish care with this office.  He reported that he did live in [place] but recently moved to [place]. He did go to [place] but was changing to this office secondary to location.  At that visit he had complaints of symptoms that were consistent with lower extremity claudication symptoms. Said the only problems/ evaluation  he has ever had with low back pain/sciatica was in 2010. Says at that time he saw [facility] and they checked on his hip to make sure that his symptoms were not secondary to his hip replacement surgery and that they determined that that episode was "sciatica." '  Reviewed in Epic that day that there are no x-rays of his lumbar spine or other x-rays there.  Note: Patient works 3 PM to 3 AM.  Says he usually goes home and goes to sleep and then is awake by 10:00 a.m.    At that visit I did go ahead and repeat any lab work that was indicated given his current medical problems. At that visit also evaluated him for lower extremity claudication and found multiple bruits on exam. Ordered appropriate arterial Dopplers. I also updated preventative care at that visit. See Assessment/Plan section  at the end of this note for the details of this.  Since then he has been seeing Dr. Gwenlyn Found regarding peripheral arterial disease. 03/04/14 he underwent lower extremity angiography and intervention. See Dr. Kennon Holter notes for details. 04/28/14 he underwent carotid artery stent.   11/07/2015: Patient has no complaints today. Has continued to have routine follow-up with Dr. Gwenlyn Found. Is taking medications as directed with no adverse effects. Taking statin. No myalgias or other adverse effects. Taking blood pressure medications. No  lightheadedness or other adverse effects.   11/29/2016: Today he reports that he has been doing well since his last visit. He reports that about the only medical update that has occurred is that he did break his foot. Says that he did notice that his weight was up a little today and thinks that that is because he had decreased activity for about 8 weeks because of his broken foot. Says that otherwise things have been stable from a medical standpoint. States that he continues to see Dr. Gwenlyn Found once a year. Says he "does Doppler tests to check the stents ". He states that he is retiring soon. Says that he and his wife are going to [place] for 2 weeks next year. 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 Says that he has 10 acres of land. About 5 acres of it is cleared and about 5 acres is wooded. Plans to do some stonework and has some ideas of some projects he wants to do there. Has no specific concerns to address today. Taking statin. No myalgias or other adverse effects. Taking blood pressure medications. No lightheadedness or other adverse effects.    Review of Systems: Consitutional: No fever, chills, fatigue, night sweats, lymphadenopathy, or weight changes. Eyes: No visual changes, eye redness, or discharge. ENT/Mouth: Ears: No otalgia, tinnitus, hearing loss, discharge. Nose: No congestion, rhinorrhea, sinus pain, or epistaxis. Throat: No sore throat, post nasal drip, or teeth pain. Cardiovascular: No CP, palpitations,  diaphoresis, DOE, edema, orthopnea, PND. Respiratory: No cough, hemoptysis, SOB, or wheezing. Gastrointestinal: No anorexia, dysphagia, reflux, pain, nausea, vomiting, hematemesis, diarrhea, constipation, BRBPR, or melena. Genitourinary: No dysuria, frequency, urgency, hematuria, incontinence, nocturia, decreased urinary stream, discharge, impotence, or testicular pain/masses. Musculoskeletal: No decreased ROM,  myalgias, stiffness, joint swelling. See HPI for pertinent positives.  Skin: No rash, erythema, lesion changes, pain, warmth, jaundice, or pruritis. Neurological: No headache, dizziness, syncope, seizures, tremors, memory loss, coordination problems, or paresthesias. Psychological: No anxiety, depression, hallucinations, SI/HI. Endocrine: No fatigue, polydipsia, polyphagia, polyuria, or known diabetes. All other systems were reviewed and are otherwise negative.  Past Medical History:  Diagnosis Date  . Arthritis   . History of tobacco abuse   . Hyperlipidemia   . Hypertension 2010  . Left-sided carotid artery disease (HCC)    high-grade ostial left common carotid artery stenosis  . Peripheral arterial disease ([place])    a. 02/2014 - right common iliac and external iliac intervention. b. H/o PTA/stenting of left common carotid artery stenosis. Known 80% left subclavian artery stenosis without UE claudication/steal sx.  . Subclavian artery stenosis, left [facility])      Past Surgical History:  Procedure Laterality Date  . CAROTID STENT INSERTION N/A 04/28/2014   Procedure: CAROTID STENT INSERTION;  Surgeon: Serafina Mitchell, MD;  Location: [facility] CATH LAB;  Service: Cardiovascular;  Laterality: N/A;  . ILIAC ARTERY STENT Right 03/04/2014   w/as well as right external iliac and common femoral artery stenting/notes 03/04/2014  . JOINT REPLACEMENT    . LOWER EXTREMITY ANGIOGRAM N/A 03/04/2014   Procedure: LOWER EXTREMITY ANGIOGRAM;  Surgeon: Lorretta Harp, MD;  Location: [facility] CATH LAB;  Service: Cardiovascular;  Laterality: N/A;  . TOTAL HIP ARTHROPLASTY Right 10/2005    Home Meds:  Outpatient Medications Prior to Visit  Medication Sig Dispense Refill  . acetaminophen (TYLENOL) 500 MG tablet Take 1,000 mg by mouth every 6 (six) hours as needed (pain).    Marland Kitchen atorvastatin (LIPITOR) 80 MG tablet TAKE 1 TABLET (80 MG TOTAL) BY MOUTH DAILY. 90 tablet 0  . cetirizine (ZYRTEC) 10 MG tablet Take 10 mg by  mouth daily.    . Cholecalciferol (VITAMIN D) 2000 UNITS tablet Take 2,000 Units by mouth daily.    . clopidogrel (PLAVIX) 75 MG tablet TAKE 1 TABLET BY MOUTH EVERY DAY WITH BREAKFAST 90 tablet 2  . Coenzyme Q10 (EQL COQ10) 300 MG CAPS Take 300 mg by mouth daily.     Marland Kitchen lisinopril (PRINIVIL,ZESTRIL) 10 MG tablet TAKE 1 TABLET BY MOUTH DAILY. 90 tablet 0  . Multiple Vitamin (MULTIVITAMIN WITH MINERALS) TABS tablet Take 1 tablet by mouth daily.    . naproxen sodium (ALEVE) 220 MG tablet Take 220 mg by mouth 2 (two) times daily as needed (pain).     . Omega-3 Fatty Acids (FISH OIL TRIPLE STRENGTH) 1400 MG CAPS Take 1,400 mg by mouth every other day.    . polyvinyl alcohol (ARTIFICIAL TEARS) 1.4 % ophthalmic solution Place 1 drop into both eyes daily as needed for dry eyes (allergies).     Facility-Administered Medications Prior to Visit  Medication Dose Route Frequency Provider Last Rate Last Dose  . 0.9 %  sodium chloride infusion  500 mL Intravenous Continuous Irene Shipper, MD        Allergies: No Known Allergies  Social History   Social History  . Marital status: Married    Spouse name: N/A  . Number of children: N/A  . Years of  education: N/A   Occupational History  . Not on file.   Social History Main Topics  . Smoking status: Former Smoker    Packs/day: 1.50    Years: 30.00    Types: Cigarettes    Quit date: 01/19/2009  . Smokeless tobacco: Never Used  . Alcohol use 1.2 oz/week    2 Cans of beer per week  . Drug use: No  . Sexual activity: Yes    Birth control/ protection: None   Other Topics Concern  . 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 He may occasionally lift 20 pounds but most is done by machine.    Mix of standing, sitting, moving around.      Used to walk a lot and do a lot outside. Got a treadmill to use during cold weather.   Married.   2 daughters- grown.            Family History  Problem Relation Age of Onset  . Hyperlipidemia Mother   . Colitis Mother   . Diabetes Mother   . Arthritis Father   . COPD Father   . Diabetes Father   . Hypertension Father   . Colon cancer Neg Hx   . Stomach cancer Neg Hx     Physical Exam: Blood pressure 110/62, pulse 68, temperature 97.6 F (36.4 C), temperature source Oral, resp. rate 16, height 5\' 11"  (1.803 m), weight 190 lb 6.4 oz (86.4 kg), SpO2 95 %.  General: Well developed, well nourished,White Male. Appears in no acute distress. Neck: Supple. Trachea midline. No thyromegaly. Full ROM. No lymphadenopathy. He has bilateral carotid bruits left greater than right. Lungs: Clear to auscultation bilaterally without wheezes, rales, or rhonchi. Breathing is of normal effort and unlabored. Cardiovascular: RRR with S1 S2. No murmurs, rubs, or gallops.  Abdomen: Soft, non-tender, non-distended with normoactive bowel sounds. No hepatosplenomegaly or masses. No rebound/guarding. No CVA tenderness. No hernias. Musculoskeletal: Full range of motion and 5/5 strength throughout. Skin: Warm and moist without erythema, ecchymosis, wounds, or rash. Neuro: A+Ox3. CN II-XII grossly intact. Moves all extremities spontaneously. Full sensation throughout. Normal gait.  Psych:  Responds to questions appropriately with a normal affect.   Assessment/Plan:  66 y.o. y/o white male    1. Encounter for preventive health examination  A. Screening Labs: - CBC with Differential/Platelet - COMPLETE METABOLIC PANEL WITH GFR - Lipid panel - TSH - PSA - Hepatitis C antibody - HIV antibody   B. Screening For Prostate Cancer: PSA--normal 03/11/2013, 10/07/2014, 10/2015 Repeat PSA now  C. Screening For Colorectal Cancer:  At [place] 02/24/2014: "Patient reports he has had one colonoscopy. Says that was at age 83. He states that this was performed at [facility] in [place] but does not recall the name of the Doctor.                                 I discussed that even if that colonoscopy was normal, that he should have repeat every 10 years.                                 He is agreeable for me to schedule followup."                                     -  Ambulatory referral to [facility] 10/07/14 patient states that he did go to the one office visit with GI but has not yet scheduled a colonoscopy. AV 10/2015 patient states that he never did follow-up with GI for colonoscopy. However at today's visit he says that he does want to follow-up with this side reorder this again today. OV 11/29/2016--reviewed that he had colonoscopy 06/29/2016. It did reveal multiple polyps. Repeat 3 years.   D. Immunizations: Influenza Vaccine: Given here 02/24/2014     Tetanus------  He received Tdap 09/02/2010 Pneumococcal--- he has no indication to need pneumonia vaccine until age 43. Zostavax -------given here 11/07/2015      Essential hypertension 11/29/2016: Blood pressure at goal. Recheck labs to monitor.  Vitamin D deficiency 02/2013 vitamin D level was low at 23.9. Patient states that since that time he has been taking over-the-counter vitamin D 1000 units daily as directed. Recheck vitamin D level 2016, 2017 were good.  11/29/2016: Vitamin D level has been good for the last 2 years with current dose of vitamin D. Therefore will not recheck lab. Continue current dose.  Hyperlipidemia 11/29/2016: On Lipitor 80 mg daily. 11/29/2016: He is fasting. Recheck labs to monitor.  Claudication of lower extremity 11/29/2016: This is managed by Dr. Gwenlyn Found.       Had lower extremity angiography and intervention 03/04/2014.  Bilateral carotid bruits 11/29/2016: This is managed by Dr. Gwenlyn Found.  He had carotid artery angiography and stent 04/28/2014     Given that he is also seeing Dr. Gwenlyn Found routinely, he can wait one year for follow-up office visit here.  F/U sooner if needed   Signed:   [address] [place], [place]   11/29/2016 10:45 AM

## 2016-11-30 LAB — HEPATITIS C ANTIBODY: HCV AB: NONREACTIVE

## 2016-11-30 LAB — PSA: PSA: 1.3 ng/mL (ref <=4.0)

## 2016-11-30 LAB — HIV ANTIBODY (ROUTINE TESTING W REFLEX): HIV 1&2 Ab, 4th Generation: NONREACTIVE

## 2016-12-16 ENCOUNTER — Other Ambulatory Visit: Payer: Self-pay | Admitting: Cardiovascular Disease

## 2016-12-25 ENCOUNTER — Other Ambulatory Visit: Payer: Self-pay | Admitting: *Deleted

## 2016-12-25 MED ORDER — CLOPIDOGREL BISULFATE 75 MG PO TABS: ORAL_TABLET | ORAL | 0 refills | Status: DC

## 2017-01-23 ENCOUNTER — Other Ambulatory Visit: Payer: Self-pay | Admitting: Physician Assistant

## 2017-01-23 NOTE — Telephone Encounter (Signed)
Refill appropriate and filled per protocol. 

## 2017-01-29 DIAGNOSIS — Z23 Encounter for immunization: Secondary | ICD-10-CM | POA: Diagnosis not present

## 2017-02-12 ENCOUNTER — Other Ambulatory Visit: Payer: Self-pay | Admitting: Physician Assistant

## 2017-03-25 ENCOUNTER — Other Ambulatory Visit: Payer: Self-pay | Admitting: Cardiovascular Disease

## 2017-04-21 ENCOUNTER — Other Ambulatory Visit: Payer: Self-pay | Admitting: Physician Assistant

## 2017-05-17 ENCOUNTER — Other Ambulatory Visit: Payer: Self-pay | Admitting: Cardiovascular Disease

## 2017-05-17 ENCOUNTER — Other Ambulatory Visit: Payer: Self-pay | Admitting: *Deleted

## 2017-05-17 DIAGNOSIS — I6523 Occlusion and stenosis of bilateral carotid arteries: Secondary | ICD-10-CM

## 2017-05-17 DIAGNOSIS — I739 Peripheral vascular disease, unspecified: Secondary | ICD-10-CM

## 2017-05-17 MED ORDER — CLOPIDOGREL BISULFATE 75 MG PO TABS: 75.0000 mg | ORAL_TABLET | Freq: Every day | ORAL | 0 refills | Status: DC

## 2017-05-21 ENCOUNTER — Telehealth: Payer: Self-pay

## 2017-05-21 MED ORDER — CLOPIDOGREL BISULFATE 75 MG PO TABS: 75.0000 mg | ORAL_TABLET | Freq: Every day | ORAL | 0 refills | Status: DC

## 2017-05-21 NOTE — Telephone Encounter (Signed)
Rx(s) sent to pharmacy electronically.  

## 2017-05-22 ENCOUNTER — Telehealth: Payer: Self-pay | Admitting: *Deleted

## 2017-05-22 NOTE — Telephone Encounter (Signed)
Left message for patient to call and schedule appointment with Dr. Berry 

## 2017-05-23 ENCOUNTER — Ambulatory Visit (HOSPITAL_COMMUNITY)
Admission: RE | Admit: 2017-05-23 | Discharge: 2017-05-23 | Disposition: A | Discharge status: Home / Self Care | Payer: Medicare Other | Source: Ambulatory Visit | Attending: Cardiovascular Disease | Admitting: Cardiovascular Disease

## 2017-05-23 DIAGNOSIS — I6523 Occlusion and stenosis of bilateral carotid arteries: Secondary | ICD-10-CM | POA: Diagnosis not present

## 2017-05-23 DIAGNOSIS — I10 Essential (primary) hypertension: Secondary | ICD-10-CM | POA: Diagnosis not present

## 2017-05-23 DIAGNOSIS — I739 Peripheral vascular disease, unspecified: Secondary | ICD-10-CM

## 2017-05-23 DIAGNOSIS — E785 Hyperlipidemia, unspecified: Secondary | ICD-10-CM | POA: Diagnosis not present

## 2017-05-23 DIAGNOSIS — Z95820 Peripheral vascular angioplasty status with implants and grafts: Secondary | ICD-10-CM | POA: Diagnosis not present

## 2017-05-23 DIAGNOSIS — Z87891 Personal history of nicotine dependence: Secondary | ICD-10-CM | POA: Insufficient documentation

## 2017-05-24 ENCOUNTER — Other Ambulatory Visit: Payer: Self-pay | Admitting: Cardiovascular Disease

## 2017-05-24 DIAGNOSIS — I6522 Occlusion and stenosis of left carotid artery: Secondary | ICD-10-CM

## 2017-05-29 ENCOUNTER — Encounter: Payer: Self-pay | Admitting: Cardiovascular Disease

## 2017-05-29 ENCOUNTER — Ambulatory Visit (INDEPENDENT_AMBULATORY_CARE_PROVIDER_SITE_OTHER): Payer: Medicare Other | Admitting: Cardiovascular Disease

## 2017-05-29 VITALS — BP 148/74 | HR 65 | Ht 71.0 in | Wt 194.6 lb

## 2017-05-29 DIAGNOSIS — I771 Stricture of artery: Secondary | ICD-10-CM

## 2017-05-29 DIAGNOSIS — I10 Essential (primary) hypertension: Secondary | ICD-10-CM

## 2017-05-29 DIAGNOSIS — I6523 Occlusion and stenosis of bilateral carotid arteries: Secondary | ICD-10-CM

## 2017-05-29 DIAGNOSIS — I739 Peripheral vascular disease, unspecified: Secondary | ICD-10-CM | POA: Diagnosis not present

## 2017-05-29 DIAGNOSIS — I6522 Occlusion and stenosis of left carotid artery: Secondary | ICD-10-CM | POA: Diagnosis not present

## 2017-05-29 NOTE — Progress Notes (Signed)
05/29/2017 Jesus Klein   06-14-1950  [id number]  Primary Physician Orlena Sheldon, PA-C Primary Cardiologist: Lorretta Harp MD Lupe Carney, [place]  HPI:  Jesus Klein is a 67 y.o.  thin-appearing Caucasian male father of 2 children, grandfather and 2 grandchildren referred by Karis Juba PA-C for peripheral vascular evaluation.I last saw him in the office 03/09/15.- I initially saw him in the office 02/12/14. He had Dopplers performed in the office today that showed high-grade ostial carotid, ostial/subclavian artery stenosis as well as an occluded left common iliac and high-grade right iliac stenosis. His cardiovascular risk factor profile is notable for a 30-pack-year history of tobacco abuse having stopped smoking 2 years ago as well as 2 hypertension. There is no family history of heart disease. He does have mild hyperlipidemia. He has never had a heart attack or stroke. Denies chest pain or shortness of breath. He did have a right total hip replacement performed by Dr. Sharol Given in 2007. He noticed lifestyle limiting claudication 6 months ago right greater than left. On 03/04/14 I performed aortic arch angiography, selective left carotid angiography, abdominal aortography as well as right common iliac and external iliac intervention. His right lower extremity claudication has completely resolved. His ABI has normalized. He does not notice claudication as much in his left leg. Because of his high grade near ostial left common carotid artery stenosis heunderwent PTCA and stenting on 04/28/14 with a 7 mm x 19 mm long Omnilink balloon expandable stent with excellent result. Follow-up lower extremity Dopplers and carotid Dopplers performed 05/23/17 suggested in-stent restenosis within the iliac stents are widely patent carotid stent. She denies claudication. His left upper extremity and lower extremities.   Current Meds  Medication Sig  . acetaminophen (TYLENOL) 500 MG tablet Take 1,000 mg by  mouth every 6 (six) hours as needed (pain).  Marland Kitchen atorvastatin (LIPITOR) 80 MG tablet TAKE 1 TABLET (80 MG TOTAL) BY MOUTH DAILY.  . cetirizine (ZYRTEC) 10 MG tablet Take 10 mg by mouth daily.  . Cholecalciferol (VITAMIN D) 2000 UNITS tablet Take 2,000 Units by mouth daily.  . clopidogrel (PLAVIX) 75 MG tablet Take 1 tablet (75 mg total) by mouth daily. NEEDS APPOINTMENT FOR FUTURE REFILLS  . Coenzyme Q10 (EQL COQ10) 300 MG CAPS Take 300 mg by mouth daily.   Marland Kitchen lisinopril (PRINIVIL,ZESTRIL) 10 MG tablet TAKE 1 TABLET BY MOUTH DAILY.  . Multiple Vitamin (MULTIVITAMIN WITH MINERALS) TABS tablet Take 1 tablet by mouth daily.  . naproxen sodium (ALEVE) 220 MG tablet Take 220 mg by mouth 2 (two) times daily as needed (pain).   . Omega-3 Fatty Acids (FISH OIL TRIPLE STRENGTH) 1400 MG CAPS Take 1,400 mg by mouth every other day.  . polyvinyl alcohol (ARTIFICIAL TEARS) 1.4 % ophthalmic solution Place 1 drop into both eyes daily as needed for dry eyes (allergies).   Current Facility-Administered Medications for the 05/29/17 encounter (Office Visit) with Lorretta Harp, MD  Medication  . 0.9 %  sodium chloride infusion     No Known Allergies  Social History   Socioeconomic History  . Marital status: Married    Spouse name: Not on file  . Number of children: Not on file  . Years of education: Not on file  . Highest education level: Not on file  Social Needs  . Financial resource strain: Not on file  . Food insecurity - worry: Not on file  . Food insecurity - inability: Not on file  .  Transportation needs - medical: Not on file  . Transportation needs - non-medical: Not on file  Occupational History  . Not on file  Tobacco Use  . Smoking status: Former Smoker    Packs/day: 1.50    Years: 30.00    Pack years: 45.00    Types: Cigarettes    Last attempt to quit: 01/19/2009    Years since quitting: 8.3  . Smokeless tobacco: Never Used  Substance and Sexual Activity  . Alcohol use: Yes     Alcohol/week: 1.2 oz    Types: 2 Cans of beer per week  . Drug use: No  . Sexual activity: Yes    Birth control/protection: None  Other Topics Concern  . 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 He may occasionally lift 20 pounds but most is done by machine.    Mix of standing, sitting, moving around.      Used to walk a lot and do a lot outside. Got a treadmill to use during cold weather.   Married.   2 daughters- grown.           Review of Systems: General: negative for chills, fever, night sweats or weight changes.  Cardiovascular: negative for chest pain, dyspnea on exertion, edema, orthopnea, palpitations, paroxysmal nocturnal dyspnea or shortness of breath Dermatological: negative for rash Respiratory: negative for cough or wheezing Urologic: negative for hematuria Abdominal: negative for nausea, vomiting, diarrhea, bright red blood per rectum, melena, or hematemesis Neurologic: negative for visual changes, syncope, or dizziness All other systems reviewed and are otherwise negative except as noted above.    Blood pressure (!) 148/74, pulse 65, height 5\' 11"  (1.803 m), weight 194 lb 9.6 oz (88.3 kg).  General appearance: alert and no distress Neck: no adenopathy, no JVD, supple, symmetrical, trachea midline, thyroid not enlarged, symmetric, no tenderness/mass/nodules and Bilateral carotid and subclavian bruits Lungs: clear to auscultation bilaterally Heart: regular rate and rhythm, S1, S2 normal, no murmur, click, rub or gallop Extremities: extremities normal, atraumatic, no cyanosis or edema Pulses: Diminished pedal pulses bilaterally Skin: Skin color, texture, turgor normal. No rashes or lesions Neurologic: Alert and oriented X 3, normal strength and tone. Normal symmetric reflexes. Normal coordination and gait  EKG sinus rhythm at 65 with septal Q waves. I personally reviewed  this EKG.  ASSESSMENT AND PLAN:   Essential hypertension History of essential hypertension blood pressure measured 140/74 in the right arm. He is on lisinopril. Continue current meds at current dosing.  Marland Kitchen  Hyperlipidemia History of hyperlipidemia on statin therapy with recent lipid profile performed 11/29/16 revealing total cholesterol 134, LDL 67 and HDL 44.  Claudication of lower extremity History of claudication with angiographically documented occlusion of his left common iliac artery performed by myself on 03/04/14 with high-grade right common iliac and external iliac artery stenosis both of which were stented. He had three-vessel runoff bilaterally. Recent Dopplers revealed in-stent restenosis within the right iliac stents. He really denies claudication.  PVD - Carotid, subclavian, bilat LE disease History of ostial left common carotid and left subclavian artery stenosis demonstrated angiographically by myself/12/15. He did have ostial left common carotid artery stenting by myself 04/28/14 with follow-up Doppler suggesting this has remained patent.  Subclavian artery stenosis, left (HCC) History of 80% left subclavian artery stenosis documented angiographically by myself 03/04/14. This was never intervened on and he is asymptomatic from this.  Lorretta Harp MD FACP,FACC,FAHA, [facility] 05/29/2017 3:13 PM

## 2017-05-29 NOTE — Patient Instructions (Signed)
Medication Instructions: Your physician recommends that you continue on your current medications as directed. Please refer to the Current Medication list given to you today.   Testing/Procedures:  IN 1 YEAR: Your physician has requested that you have a carotid duplex. This test is an ultrasound of the carotid arteries in your neck. It looks at blood flow through these arteries that supply the brain with blood. Allow one hour for this exam. There are no restrictions or special instructions.  Your physician has requested that you have a lower extremity arterial duplex. During this test, ultrasound is used to evaluate arterial blood flow in the legs. Allow one hour for this exam. There are no restrictions or special instructions.  Your physician has requested that you have a aorta and iliac duplex. During this test, an ultrasound is used to evaluate blood flow to the aorta and iliac arteries. Allow one hour for this exam. Do not eat after midnight the day before and avoid carbonated beverages.   Your physician has requested that you have an ankle brachial index (ABI). During this test an ultrasound and blood pressure cuff are used to evaluate the arteries that supply the arms and legs with blood. Allow thirty minutes for this exam. There are no restrictions or special instructions.  Follow-Up: Your physician wants you to follow-up in: 1  Year with Dr. Veleta Miners dopplers are completed. You will receive a reminder letter in the mail two months in advance. If you don't receive a letter, please call our office to schedule the follow-up appointment.  If you need a refill on your cardiac medications before your next appointment, please call your pharmacy.

## 2017-05-29 NOTE — Assessment & Plan Note (Signed)
History of claudication with angiographically documented occlusion of his left common iliac artery performed by myself on 03/04/14 with high-grade right common iliac and external iliac artery stenosis both of which were stented. He had three-vessel runoff bilaterally. Recent Dopplers revealed in-stent restenosis within the right iliac stents. He really denies claudication.

## 2017-05-29 NOTE — Assessment & Plan Note (Signed)
History of 80% left subclavian artery stenosis documented angiographically by myself 03/04/14. This was never intervened on and he is asymptomatic from this.

## 2017-05-29 NOTE — Assessment & Plan Note (Signed)
History of ostial left common carotid and left subclavian artery stenosis demonstrated angiographically by myself/12/15. He did have ostial left common carotid artery stenting by myself 04/28/14 with follow-up Doppler suggesting this has remained patent.

## 2017-05-29 NOTE — Assessment & Plan Note (Signed)
History of essential hypertension blood pressure measured 140/74 in the right arm. He is on lisinopril. Continue current meds at current dosing.  Marland Kitchen

## 2017-05-29 NOTE — Assessment & Plan Note (Signed)
History of hyperlipidemia on statin therapy with recent lipid profile performed 11/29/16 revealing total cholesterol 134, LDL 67 and HDL 44.

## 2017-06-17 ENCOUNTER — Other Ambulatory Visit: Payer: Self-pay | Admitting: Cardiovascular Disease

## 2017-07-15 ENCOUNTER — Other Ambulatory Visit: Payer: Self-pay

## 2017-07-15 MED ORDER — ATORVASTATIN CALCIUM 80 MG PO TABS: 80.0000 mg | ORAL_TABLET | Freq: Every day | ORAL | 1 refills | Status: DC

## 2018-02-02 ENCOUNTER — Other Ambulatory Visit: Payer: Self-pay | Admitting: Physician Assistant

## 2018-02-14 ENCOUNTER — Other Ambulatory Visit: Payer: Self-pay

## 2018-02-14 ENCOUNTER — Encounter: Payer: Self-pay | Admitting: Family Medicine

## 2018-02-14 ENCOUNTER — Ambulatory Visit (INDEPENDENT_AMBULATORY_CARE_PROVIDER_SITE_OTHER): Payer: Medicare Other | Admitting: Family Medicine

## 2018-02-14 VITALS — BP 138/86 | HR 62 | Temp 99.1°F | Resp 14 | Ht 71.0 in | Wt 186.0 lb

## 2018-02-14 DIAGNOSIS — I10 Essential (primary) hypertension: Secondary | ICD-10-CM

## 2018-02-14 DIAGNOSIS — Z87891 Personal history of nicotine dependence: Secondary | ICD-10-CM

## 2018-02-14 DIAGNOSIS — I739 Peripheral vascular disease, unspecified: Secondary | ICD-10-CM | POA: Diagnosis not present

## 2018-02-14 DIAGNOSIS — Z0000 Encounter for general adult medical examination without abnormal findings: Secondary | ICD-10-CM

## 2018-02-14 DIAGNOSIS — E785 Hyperlipidemia, unspecified: Secondary | ICD-10-CM

## 2018-02-14 LAB — COMPLETE METABOLIC PANEL WITH GFR
AG RATIO: 2 (calc) (ref 1.0–2.5)
ALBUMIN MSPROF: 4.9 g/dL (ref 3.6–5.1)
ALT: 24 U/L (ref 9–46)
AST: 22 U/L (ref 10–35)
Alkaline phosphatase (APISO): 68 U/L (ref 40–115)
BUN: 16 mg/dL (ref 7–25)
CALCIUM: 9.9 mg/dL (ref 8.6–10.3)
CO2: 28 mmol/L (ref 20–32)
Chloride: 105 mmol/L (ref 98–110)
Creat: 0.86 mg/dL (ref 0.70–1.25)
GFR, EST AFRICAN AMERICAN: 105 mL/min/{1.73_m2} (ref >=60)
GFR, EST NON AFRICAN AMERICAN: 90 mL/min/{1.73_m2} (ref >=60)
Globulin: 2.4 g/dL (calc) (ref 1.9–3.7)
Glucose, Bld: 92 mg/dL (ref 65–99)
POTASSIUM: 4.8 mmol/L (ref 3.5–5.3)
Sodium: 141 mmol/L (ref 135–146)
TOTAL PROTEIN: 7.3 g/dL (ref 6.1–8.1)
Total Bilirubin: 0.5 mg/dL (ref 0.2–1.2)

## 2018-02-14 LAB — CBC WITH DIFFERENTIAL/PLATELET
Basophils Absolute: 19 cells/uL (ref 0–200)
Basophils Relative: 0.3 %
EOS ABS: 192 {cells}/uL (ref 15–500)
Eosinophils Relative: 3 %
HEMATOCRIT: 48.2 % (ref 38.5–50.0)
HEMOGLOBIN: 15.7 g/dL (ref 13.2–17.1)
LYMPHS ABS: 2234 {cells}/uL (ref 850–3900)
MCH: 28.9 pg (ref 27.0–33.0)
MCHC: 32.6 g/dL (ref 32.0–36.0)
MCV: 88.8 fL (ref 80.0–100.0)
MPV: 10.2 fL (ref 7.5–12.5)
Monocytes Relative: 10.2 %
NEUTROS ABS: 3302 {cells}/uL (ref 1500–7800)
Neutrophils Relative %: 51.6 %
PLATELETS: 287 10*3/uL (ref 140–400)
RBC: 5.43 10*6/uL (ref 4.20–5.80)
RDW: 12.1 % (ref 11.0–15.0)
TOTAL LYMPHOCYTE: 34.9 %
WBC mixed population: 653 cells/uL (ref 200–950)
WBC: 6.4 10*3/uL (ref 3.8–10.8)

## 2018-02-14 LAB — LIPID PANEL
CHOL/HDL RATIO: 2.7 (calc) (ref <5.0)
CHOLESTEROL: 144 mg/dL (ref <200)
HDL: 53 mg/dL (ref >40)
LDL CHOLESTEROL (CALC): 73 mg/dL
NON-HDL CHOLESTEROL (CALC): 91 mg/dL (ref <130)
Triglycerides: 92 mg/dL (ref <150)

## 2018-02-14 NOTE — Progress Notes (Signed)
Subjective:   Jesus Klein is a 67 y.o. male who presents for Medicare Annual/Subsequent preventive examination.  He presents for subsequent Medicare well visit, he continues to see vascular surgery annually, does not go to any other specialist.  He has increased exercise and continues to have a very holistic diet with no processed sugars or white flour, eats plenty of fruits and vegetables prepares his own food, has been losing weight with all of his efforts.  He got his Pneumovax and flu shots at [facility] in [facility]  Review of Systems:  Review of Systems  Constitutional: Negative.  Negative for activity change, appetite change, fatigue and unexpected weight change.  HENT: Negative.   Eyes: Negative.   Respiratory: Negative.  Negative for shortness of breath.   Cardiovascular: Negative.  Negative for chest pain, palpitations and leg swelling.  Gastrointestinal: Negative.  Negative for abdominal pain and blood in stool.  Endocrine: Negative.   Genitourinary: Negative.  Negative for decreased urine volume, difficulty urinating, testicular pain and urgency.  Skin: Negative.  Negative for color change and pallor.  Allergic/Immunologic: Negative.   Neurological: Negative.  Negative for syncope, weakness, light-headedness and numbness.  Psychiatric/Behavioral: Negative.  Negative for confusion, dysphoric mood, self-injury and suicidal ideas. The patient is not nervous/anxious.   All other systems reviewed and are negative.     Cardiac Risk Factors include: advanced age (>68men, >32 women);male gender;dyslipidemia;hypertension     Objective:    Vitals: BP 138/86   Pulse 62   Temp 99.1 F (37.3 C) (Oral)   Resp 14   Ht 5\' 11"  (1.803 m)   Wt 186 lb (84.4 kg)   SpO2 98%   BMI 25.94 kg/m   Body mass index is 25.94 kg/m.  Advanced Directives 02/14/2018 06/29/2016 04/28/2014 04/28/2014 03/04/2014 03/04/2014  Does Patient Have a Medical Advance Directive? No;Yes No  No No No No  Type of Academic librarian;Living will - - - - -  Does patient want to make changes to medical advance directive? No - Patient declined - - - - -  Copy of [place] in Chart? No - copy requested - - - - -  Would patient like information on creating a medical advance directive? No - Patient declined - No - patient declined information No - patient declined information No - patient declined information -    Tobacco Social History   Tobacco Use  Smoking Status Former Smoker  . Packs/day: 1.50  . Years: 30.00  . Pack years: 45.00  . Types: Cigarettes  . Last attempt to quit: 01/19/2009  . Years since quitting: 9.0  Smokeless Tobacco Never Used     Counseling given: Not Answered   Clinical Intake:  Pre-visit preparation completed: Yes  Pain : No/denies pain     BMI - recorded: 25 Nutritional Status: BMI 25 -29 Overweight Nutritional Risks: None Diabetes: No  How often do you need to have someone help you when you read instructions, pamphlets, or other written materials from your doctor or pharmacy?: 1 - Never What is the last grade level you completed in school?: Bachelors Degree  Interpreter Needed?: No  Information entered by :: [employer], LPN  Past Medical History:  Diagnosis Date  . Arthritis   . History of tobacco abuse   . Hyperlipidemia   . Hypertension 2010  . Left-sided carotid artery disease (HCC)    high-grade ostial left common carotid artery stenosis  . Peripheral  arterial disease ([place])    a. 02/2014 - right common iliac and external iliac intervention. b. H/o PTA/stenting of left common carotid artery stenosis. Known 80% left subclavian artery stenosis without UE claudication/steal sx.  . Subclavian artery stenosis, left [facility])    Past Surgical History:  Procedure Laterality Date  . CAROTID STENT INSERTION N/A 04/28/2014   Procedure: CAROTID STENT INSERTION;  Surgeon: Serafina Mitchell, MD;  Location:  [facility] CATH LAB;  Service: Cardiovascular;  Laterality: N/A;  . ILIAC ARTERY STENT Right 03/04/2014   w/as well as right external iliac and common femoral artery stenting/notes 03/04/2014  . JOINT REPLACEMENT    . LOWER EXTREMITY ANGIOGRAM N/A 03/04/2014   Procedure: LOWER EXTREMITY ANGIOGRAM;  Surgeon: Lorretta Harp, MD;  Location[facility] CATH LAB;  Service: Cardiovascular;  Laterality: N/A;  . TOTAL HIP ARTHROPLASTY Right 10/2005   Family History  Problem Relation Age of Onset  . Hyperlipidemia Mother   . Colitis Mother   . Diabetes Mother   . Arthritis Father   . COPD Father   . Diabetes Father   . Hypertension Father   . Colon cancer Neg Hx   . Stomach cancer Neg Hx    Social History   Socioeconomic History  . Marital status: Married    Spouse name: Not on file  . Number of children: Not on file  . Years of education: Not on file  . Highest education level: Not on file  Occupational History  . Not on file  Social Needs  . Financial resource strain: Not on file  . Food insecurity:    Worry: Not on file    Inability: Not on file  . Transportation needs:    Medical: Not on file    Non-medical: Not on file  Tobacco Use  . Smoking status: Former Smoker    Packs/day: 1.50    Years: 30.00    Pack years: 45.00    Types: Cigarettes    Last attempt to quit: 01/19/2009    Years since quitting: 9.0  . Smokeless tobacco: Never Used  Substance and Sexual Activity  . Alcohol use: Yes    Alcohol/week: 2.0 standard drinks    Types: 2 Cans of beer per week  . Drug use: No  . Sexual activity: Yes    Birth control/protection: None  Lifestyle  . Physical activity:    Days per week: Not on file    Minutes per session: Not on file  . Stress: Not on file  Relationships  . Social connections:    Talks on phone: Not on file    Gets together: Not on file    Attends religious service: Not on file    Active member of club or organization: Not on file    Attends meetings of clubs or  organizations: Not on file    Relationship status: Not on file  Other Topics Concern  . 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 He may occasionally lift 20 pounds but most is done by machine.    Mix of standing, sitting, moving around.      Used to walk a lot and do a lot outside. Got a treadmill to use during cold weather.   Married.   2 daughters- grown.          Outpatient Encounter Medications as of 02/14/2018  Medication Sig  . acetaminophen (  TYLENOL) 500 MG tablet Take 1,000 mg by mouth every 6 (six) hours as needed (pain).  Marland Kitchen atorvastatin (LIPITOR) 80 MG tablet Take 1 tablet (80 mg total) by mouth daily.  . cetirizine (ZYRTEC) 10 MG tablet Take 10 mg by mouth daily.  . Cholecalciferol (VITAMIN D) 2000 UNITS tablet Take 2,000 Units by mouth daily.  . clopidogrel (PLAVIX) 75 MG tablet TAKE 1 TABLET BY MOUTH EVERY DAY WITH BREAKFAST **PT NEEDS APPT**  . Coenzyme Q10 (EQL COQ10) 300 MG CAPS Take 300 mg by mouth daily.   Marland Kitchen lisinopril (PRINIVIL,ZESTRIL) 10 MG tablet TAKE 1 TABLET BY MOUTH DAILY.  . Multiple Vitamin (MULTIVITAMIN WITH MINERALS) TABS tablet Take 1 tablet by mouth daily.  . naproxen sodium (ALEVE) 220 MG tablet Take 220 mg by mouth 2 (two) times daily as needed (pain).   . Omega-3 Fatty Acids (FISH OIL TRIPLE STRENGTH) 1400 MG CAPS Take 1,400 mg by mouth every other day.  . polyvinyl alcohol (ARTIFICIAL TEARS) 1.4 % ophthalmic solution Place 1 drop into both eyes daily as needed for dry eyes (allergies).  . [DISCONTINUED] clopidogrel (PLAVIX) 75 MG tablet Take 1 tablet (75 mg total) by mouth daily. NEEDS APPOINTMENT FOR FUTURE REFILLS   Facility-Administered Encounter Medications as of 02/14/2018  Medication  . 0.9 %  sodium chloride infusion    Activities of Daily Living In your present state of health, do you have any difficulty performing the following activities:  02/14/2018  Hearing? N  Vision? N  Difficulty concentrating or making decisions? N  Walking or climbing stairs? N  Dressing or bathing? N  Doing errands, shopping? N  Preparing Food and eating ? N  Using the Toilet? N  In the past six months, have you accidently leaked urine? N  Do you have problems with loss of bowel control? N  Managing your Medications? N  Managing your Finances? N  Housekeeping or managing your Housekeeping? N  Some recent data might be hidden    Patient Care Team: Delsa Grana, PA-C as PCP - General (Family Medicine)   Assessment:   This is a routine wellness examination for Soren.  Exercise Activities and Dietary recommendations Current Exercise Habits: Structured exercise class, Type of exercise: strength training/weights;walking, Time (Minutes): 60, Frequency (Times/Week): 7, Weekly Exercise (Minutes/Week): 420, Intensity: Moderate, Exercise limited by: None identified  Goals   None     Fall Risk Fall Risk  02/14/2018 11/29/2016  Falls in the past year? No No   Is the patient's home free of loose throw rugs in walkways, pet beds, electrical cords, etc?   yes      Grab bars in the bathroom? no      Handrails on the stairs?   yes      Adequate lighting?   yes   Depression Screen PHQ 2/9 Scores 02/14/2018 11/29/2016 02/02/2014  PHQ - 2 Score 0 0 0  PHQ- 9 Score - 0 -     Office Visit from 02/14/2018 in [facility]  AUDIT-C Score  2       Cognitive Function  Alert? Yes  Normal Appearance?Yes  Oriented to person? Yes  Place? Yes  Time? Yes  Recall of three objects? Yes  Can perform simple calculations? Yes  Displays appropriate judgment?Yes  Can read the correct time from a watch face?Yes          Immunization History  Administered Date(s) Administered  . Influenza,inj,Quad PF,6+ Mos 02/02/2014  . Tdap 09/02/2010  . Zoster  11/07/2015    Qualifies for Shingles Vaccine? Obtaining records from  pharmacy  Screening Tests Health Maintenance  Topic Date Due  . INFLUENZA VACCINE  02/24/2018 (Originally 11/21/2017)  . PNA vac Low Risk Adult (1 of 2 - PCV13) 02/24/2018 (Originally 01/20/2017)  . COLONOSCOPY  06/30/2019  . TETANUS/TDAP  09/04/2020  . Hepatitis C Screening  Completed   Cancer Screenings: Lung: Low Dose CT Chest recommended if Age 69-80 years, 30 pack-year currently smoking OR have quit w/in 15years. Patient does qualify. Will discuss further at subsequent appointment, pt recently switched to me as PCP, and I need to review more thoroughly med hx and past discussions with his last PCP Karis Juba PA-C Colorectal: UTD  Additional Screenings: done Hepatitis C Screening:      Plan:      Problem List Items Addressed This Visit      Cardiovascular and Mediastinum   Essential hypertension   Relevant Orders   CBC with Differential/Platelet   COMPLETE METABOLIC PANEL WITH GFR   Lipid panel   PVD - Carotid, subclavian, bilat LE disease   Relevant Orders   CBC with Differential/Platelet     Other   Hyperlipidemia   Relevant Orders   CBC with Differential/Platelet   COMPLETE METABOLIC PANEL WITH GFR   Lipid panel   History of tobacco abuse    Other Visit Diagnoses    Encounter for Medicare annual wellness exam    -  Primary         I have personally reviewed and noted the following in the patient's chart:   . Medical and social history . Use of alcohol, tobacco or illicit drugs  . Current medications and supplements . Functional ability and status . Nutritional status . Physical activity . Advanced directives . List of other physicians . Hospitalizations, surgeries, and ER visits in previous 12 months . Vitals . Screenings to include cognitive, depression, and falls . Referrals and appointments  In addition, I have reviewed and discussed with patient certain preventive protocols, quality metrics, and best practice recommendations. A written  personalized care plan for preventive services as well as general preventive health recommendations were provided to patient.     Delsa Grana, PA-C  02/14/2018

## 2018-02-14 NOTE — Patient Instructions (Addendum)
Jesus Klein , Thank you for taking time to come for your Medicare Wellness Visit. I appreciate your ongoing commitment to your health goals. Please review the following plan we discussed and let me know if I can assist you in the future.   These are the goals we discussed:  Continue healthy lifestyle and exercise   This is a list of the screening recommended for you and due dates:  Health Maintenance  Topic Date Due  . Flu Shot  02/24/2018*  . Pneumonia vaccines (1 of 2 - PCV13) 02/24/2018*  . Colon Cancer Screening  06/30/2019  . Tetanus Vaccine  09/04/2020  .  Hepatitis C: One time screening is recommended by Center for Disease Control  (CDC) for  adults born from [birth year] through 1964.   Completed  *Topic was postponed. The date shown is not the original due date.    Health Maintenance, Male A healthy lifestyle and preventive care is important for your health and wellness. Ask your health care provider about what schedule of regular examinations is right for you. What should I know about weight and diet? Eat a Healthy Diet  Eat plenty of vegetables, fruits, whole grains, low-fat dairy products, and lean protein.  Do not eat a lot of foods high in solid fats, added sugars, or salt.  Maintain a Healthy Weight Regular exercise can help you achieve or maintain a healthy weight. You should:  Do at least 150 minutes of exercise each week. The exercise should increase your heart rate and make you sweat (moderate-intensity exercise).  Do strength-training exercises at least twice a week.  Watch Your Levels of Cholesterol and Blood Lipids  Have your blood tested for lipids and cholesterol every 5 years starting at 66 years of age. If you are at high risk for heart disease, you should start having your blood tested when you are 66 years old. You may need to have your cholesterol levels checked more often if: ? Your lipid or cholesterol levels are high. ? You are older than 66 years of  age. ? You are at high risk for heart disease.  What should I know about cancer screening? Many types of cancers can be detected early and may often be prevented. Lung Cancer  You should be screened every year for lung cancer if: ? You are a current smoker who has smoked for at least 30 years. ? You are a former smoker who has quit within the past 15 years.  Talk to your health care provider about your screening options, when you should start screening, and how often you should be screened.  Colorectal Cancer  Routine colorectal cancer screening usually begins at 66 years of age and should be repeated every 5-10 years until you are 66 years old. You may need to be screened more often if early forms of precancerous polyps or small growths are found. Your health care provider may recommend screening at an earlier age if you have risk factors for colon cancer.  Your health care provider may recommend using home test kits to check for hidden blood in the stool.  A small camera at the end of a tube can be used to examine your colon (sigmoidoscopy or colonoscopy). This checks for the earliest forms of colorectal cancer.  Prostate and Testicular Cancer  Depending on your age and overall health, your health care provider may do certain tests to screen for prostate and testicular cancer.  Talk to your health care provider about any symptoms  or concerns you have about testicular or prostate cancer.  Skin Cancer  Check your skin from head to toe regularly.  Tell your health care provider about any new moles or changes in moles, especially if: ? There is a change in a mole's size, shape, or color. ? You have a mole that is larger than a pencil eraser.  Always use sunscreen. Apply sunscreen liberally and repeat throughout the day.  Protect yourself by wearing long sleeves, pants, a wide-brimmed hat, and sunglasses when outside.  What should I know about heart disease, diabetes, and high  blood pressure?  If you are 65-60 years of age, have your blood pressure checked every 3-5 years. If you are 30 years of age or older, have your blood pressure checked every year. You should have your blood pressure measured twice-once when you are at a hospital or clinic, and once when you are not at a hospital or clinic. Record the average of the two measurements. To check your blood pressure when you are not at a hospital or clinic, you can use: ? An automated blood pressure machine at a pharmacy. ? A home blood pressure monitor.  Talk to your health care provider about your target blood pressure.  If you are between 45-72 years old, ask your health care provider if you should take aspirin to prevent heart disease.  Have regular diabetes screenings by checking your fasting blood sugar level. ? If you are at a normal weight and have a low risk for diabetes, have this test once every three years after the age of 58. ? If you are overweight and have a high risk for diabetes, consider being tested at a younger age or more often.  A one-time screening for abdominal aortic aneurysm (AAA) by ultrasound is recommended for men aged 33-75 years who are current or former smokers. What should I know about preventing infection? Hepatitis B If you have a higher risk for hepatitis B, you should be screened for this virus. Talk with your health care provider to find out if you are at risk for hepatitis B infection. Hepatitis C Blood testing is recommended for:  Everyone born from [birth year] through 1964.  Anyone with known risk factors for hepatitis C.  Sexually Transmitted Diseases (STDs)  You should be screened each year for STDs including gonorrhea and chlamydia if: ? You are sexually active and are younger than 66 years of age. ? You are older than 66 years of age and your health care provider tells you that you are at risk for this type of infection. ? Your sexual activity has changed since you were  last screened and you are at an increased risk for chlamydia or gonorrhea. Ask your health care provider if you are at risk.  Talk with your health care provider about whether you are at high risk of being infected with HIV. Your health care provider may recommend a prescription medicine to help prevent HIV infection.  What else can I do?  Schedule regular health, dental, and eye exams.  Stay current with your vaccines (immunizations).  Do not use any tobacco products, such as cigarettes, chewing tobacco, and e-cigarettes. If you need help quitting, ask your health care provider.  Limit alcohol intake to no more than 2 drinks per day. One drink equals 12 ounces of beer, 5 ounces of wine, or 1 ounces of hard liquor.  Do not use street drugs.  Do not share needles.  Ask your health care provider  for help if you need support or information about quitting drugs.  Tell your health care provider if you often feel depressed.  Tell your health care provider if you have ever been abused or do not feel safe at home. This information is not intended to replace advice given to you by your health care provider. Make sure you discuss any questions you have with your health care provider. Document Released: 10/06/2007 Document Revised: 12/07/2015 Document Reviewed: 01/11/2015 Elsevier Interactive Patient Education  [employer].

## 2018-02-17 ENCOUNTER — Encounter: Payer: Self-pay | Admitting: *Deleted

## 2018-02-24 DIAGNOSIS — Z23 Encounter for immunization: Secondary | ICD-10-CM | POA: Diagnosis not present

## 2018-03-17 ENCOUNTER — Other Ambulatory Visit: Payer: Self-pay

## 2018-03-17 MED ORDER — ATORVASTATIN CALCIUM 80 MG PO TABS: 80.0000 mg | ORAL_TABLET | Freq: Every day | ORAL | 1 refills | Status: DC

## 2018-05-20 ENCOUNTER — Other Ambulatory Visit: Payer: Self-pay

## 2018-05-20 MED ORDER — LISINOPRIL 10 MG PO TABS: 10.0000 mg | ORAL_TABLET | Freq: Every day | ORAL | 1 refills | Status: DC

## 2018-05-23 ENCOUNTER — Ambulatory Visit (HOSPITAL_BASED_OUTPATIENT_CLINIC_OR_DEPARTMENT_OTHER)
Admission: RE | Admit: 2018-05-23 | Discharge: 2018-05-23 | Disposition: A | Discharge status: Home / Self Care | Payer: Medicare Other | Source: Ambulatory Visit | Attending: Cardiovascular Disease | Admitting: Cardiovascular Disease

## 2018-05-23 ENCOUNTER — Ambulatory Visit (HOSPITAL_COMMUNITY)
Admission: RE | Admit: 2018-05-23 | Discharge: 2018-05-23 | Disposition: A | Discharge status: Home / Self Care | Payer: Medicare Other | Source: Ambulatory Visit | Attending: Cardiology | Admitting: Cardiology

## 2018-05-23 DIAGNOSIS — I6522 Occlusion and stenosis of left carotid artery: Secondary | ICD-10-CM

## 2018-05-23 DIAGNOSIS — I739 Peripheral vascular disease, unspecified: Secondary | ICD-10-CM | POA: Diagnosis not present

## 2018-05-26 ENCOUNTER — Other Ambulatory Visit: Payer: Self-pay | Admitting: *Deleted

## 2018-05-26 DIAGNOSIS — I6522 Occlusion and stenosis of left carotid artery: Secondary | ICD-10-CM

## 2018-05-26 DIAGNOSIS — I739 Peripheral vascular disease, unspecified: Secondary | ICD-10-CM

## 2018-06-12 ENCOUNTER — Other Ambulatory Visit: Payer: Self-pay | Admitting: Cardiovascular Disease

## 2018-08-08 ENCOUNTER — Other Ambulatory Visit: Payer: Self-pay

## 2018-08-08 MED ORDER — LISINOPRIL 10 MG PO TABS: 10.0000 mg | ORAL_TABLET | Freq: Every day | ORAL | 2 refills | Status: DC

## 2018-09-04 ENCOUNTER — Telehealth: Payer: Self-pay | Admitting: *Deleted

## 2018-09-04 ENCOUNTER — Other Ambulatory Visit: Payer: Self-pay | Admitting: Family Medicine

## 2018-09-04 NOTE — Telephone Encounter (Signed)
A message was left, re: follow up visit. 

## 2018-10-31 ENCOUNTER — Other Ambulatory Visit: Payer: Self-pay | Admitting: Family Medicine

## 2019-01-05 DIAGNOSIS — Z23 Encounter for immunization: Secondary | ICD-10-CM | POA: Diagnosis not present

## 2019-03-02 ENCOUNTER — Other Ambulatory Visit: Payer: Self-pay | Admitting: Family Medicine

## 2019-03-04 ENCOUNTER — Ambulatory Visit (INDEPENDENT_AMBULATORY_CARE_PROVIDER_SITE_OTHER): Payer: Medicare Other | Admitting: Cardiovascular Disease

## 2019-03-04 ENCOUNTER — Encounter: Payer: Self-pay | Admitting: Cardiovascular Disease

## 2019-03-04 ENCOUNTER — Other Ambulatory Visit: Payer: Self-pay

## 2019-03-04 VITALS — BP 153/84 | HR 72 | Ht 71.0 in | Wt 182.8 lb

## 2019-03-04 DIAGNOSIS — I6522 Occlusion and stenosis of left carotid artery: Secondary | ICD-10-CM | POA: Diagnosis not present

## 2019-03-04 DIAGNOSIS — I739 Peripheral vascular disease, unspecified: Secondary | ICD-10-CM

## 2019-03-04 DIAGNOSIS — I771 Stricture of artery: Secondary | ICD-10-CM | POA: Diagnosis not present

## 2019-03-04 NOTE — Assessment & Plan Note (Signed)
History of peripheral arterial disease with known occluded left common iliac artery status post right common and external iliac artery stenting 03/04/2014 with Dopplers that suggested in-stent restenosis performed 05/23/2018 although the patient denies claudication.

## 2019-03-04 NOTE — Progress Notes (Signed)
03/04/2019 Jesus Klein   [birth year]  [id number]  Primary Physician Delsa Grana, PA-C Primary Cardiologist: Lorretta Harp MD Lupe Carney, [place]  HPI:  Jesus Klein is a 68 y.o.  thin-appearing Caucasian male father of 2 children, grandfather and 2 grandchildren referred by Karis Juba PA-C for peripheral vascular evaluation.I last saw him in the office  05/29/2017. I initially saw him in the office 02/12/14. He had Dopplers performed in the office today that showed high-grade ostial carotid, ostial/subclavian artery stenosis as well as an occluded left common iliac and high-grade right iliac stenosis. His cardiovascular risk factor profile is notable for a 30-pack-year history of tobacco abuse having stopped smoking 2 years ago as well as 2 hypertension. There is no family history of heart disease. He does have mild hyperlipidemia. He has never had a heart attack or stroke. Denies chest pain or shortness of breath. He did have a right total hip replacement performed by Dr. Sharol Given in 2007. He noticed lifestyle limiting claudication 6 months ago right greater than left. On 03/04/14 I performed aortic arch angiography, selective left carotid angiography, abdominal aortography as well as right common iliac and external iliac intervention. His right lower extremity claudication has completely resolved. His ABI has normalized. He does not notice claudication as much in his left leg. Because of his high grade near ostial left common carotid artery stenosis heunderwent PTCA and stenting on 04/28/14 with a 7 mm x 19 mm long Omnilink balloon expandable stent with excellent result. Follow-up lower extremity Dopplers and carotid Dopplers performed 05/23/17 suggested in-stent restenosis within the iliac stents are widely patent carotid stent.  His most recent Dopplers performed in January 2020 revealed patent carotid stent, and moderate in-stent restenosis within the right iliac stent although he denies  claudication.  Since I saw him almost 2 years ago he continues to do well.  He has made lifestyle modifications and change his diet.  Is lost 12 pounds.  He exercises 7 days a week for an hour at a time without symptoms.  He denies chest pain or shortness of breath.  Current Meds  Medication Sig   acetaminophen (TYLENOL) 500 MG tablet Take 1,000 mg by mouth every 6 (six) hours as needed (pain).   atorvastatin (LIPITOR) 80 MG tablet TAKE 1 TABLET BY MOUTH EVERY DAY   cetirizine (ZYRTEC) 10 MG tablet Take 10 mg by mouth daily.   Cholecalciferol (VITAMIN D) 2000 UNITS tablet Take 2,000 Units by mouth daily.   clopidogrel (PLAVIX) 75 MG tablet TAKE 1 TABLET BY MOUTH EVERY DAY WITH BREAKFAST **PT NEEDS APPT**   Coenzyme Q10 (EQL COQ10) 300 MG CAPS Take 300 mg by mouth daily.    lisinopril (ZESTRIL) 10 MG tablet TAKE 1 TABLET BY MOUTH EVERY DAY   Multiple Vitamin (MULTIVITAMIN WITH MINERALS) TABS tablet Take 1 tablet by mouth daily.   naproxen sodium (ALEVE) 220 MG tablet Take 220 mg by mouth 2 (two) times daily as needed (pain).    Omega-3 Fatty Acids (FISH OIL TRIPLE STRENGTH) 1400 MG CAPS Take 1,400 mg by mouth every other day.   polyvinyl alcohol (ARTIFICIAL TEARS) 1.4 % ophthalmic solution Place 1 drop into both eyes daily as needed for dry eyes (allergies).   Current Facility-Administered Medications for the 03/04/19 encounter (Office Visit) with Lorretta Harp, MD  Medication   0.9 %  sodium chloride infusion     No Known Allergies  Social History   Socioeconomic History  Marital status: Married    Spouse name: Not on file   Number of children: Not on file   Years of education: Not on file   Highest education level: Not on file  Occupational History   Not on file  Social Needs   Financial resource strain: Not on file   Food insecurity    Worry: Not on file    Inability: Not on file   Transportation needs    Medical: Not on file    Non-medical: Not on  file  Tobacco Use   Smoking status: Former Smoker    Packs/day: 1.50    Years: 30.00    Pack years: 45.00    Types: Cigarettes    Quit date: 01/19/2009    Years since quitting: 10.1   Smokeless tobacco: Never Used  Substance and Sexual Activity   Alcohol use: Yes    Alcohol/week: 2.0 standard drinks    Types: 2 Cans of beer per week   Drug use: No   Sexual activity: Yes    Birth control/protection: None  Lifestyle   Physical activity    Days per week: Not on file    Minutes per session: Not on file   Stress: Not on file  Relationships   Social connections    Talks on phone: Not on file    Gets together: Not on file    Attends religious service: Not on file    Active member of club or organization: Not on file    Attends meetings of clubs or organizations: Not on file    Relationship status: Not on file   Intimate partner violence    Fear of current or ex partner: Not on file    Emotionally abused: Not on file    Physically abused: Not on file    Forced sexual activity: Not on file  Other Topics Concern   Not on file  Social History Narrative   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 He may occasionally lift 20 pounds but most is done by machine.    Mix of standing, sitting, moving around.      Used to walk a lot and do a lot outside. Got a treadmill to use during cold weather.   Married.   2 daughters- grown.           Review of Systems: General: negative for chills, fever, night sweats or weight changes.  Cardiovascular: negative for chest pain, dyspnea on exertion, edema, orthopnea, palpitations, paroxysmal nocturnal dyspnea or shortness of breath Dermatological: negative for rash Respiratory: negative for cough or wheezing Urologic: negative for hematuria Abdominal: negative for nausea, vomiting, diarrhea, bright red blood per rectum, melena, or hematemesis Neurologic: negative for visual  changes, syncope, or dizziness All other systems reviewed and are otherwise negative except as noted above.    Blood pressure (!) 161/78, pulse 72, height 5\' 11"  (1.803 m), weight 182 lb 12.8 oz (82.9 kg), SpO2 95 %.  General appearance: alert and no distress Neck: no adenopathy, no JVD, supple, symmetrical, trachea midline, thyroid not enlarged, symmetric, no tenderness/mass/nodules and Bilateral carotid bruits left louder than right Lungs: clear to auscultation bilaterally Heart: regular rate and rhythm, S1, S2 normal, no murmur, click, rub or gallop Extremities: extremities normal, atraumatic, no cyanosis or edema Pulses: 2+ and symmetric Skin: Skin color, texture, turgor normal. No rashes or lesions Neurologic: Alert and oriented X 3, normal strength and tone. Normal  symmetric reflexes. Normal coordination and gait  EKG sinus rhythm 72 with septal Q waves rightward axis.  I personally reviewed this EKG.  ASSESSMENT AND PLAN:   Essential hypertension History of essential hypertension with blood pressure measured today 161/78.  He is on lisinopril.  Hyperlipidemia History of hyperlipidemia on statin therapy with lipid profile performed 02/14/2018 revealing a total cholesterol 144, LDL 73 and HDL 53.  S/P RCIA, RCFA, REIA PTA 03/04/14 History of peripheral arterial disease with known occluded left common iliac artery status post right common and external iliac artery stenting 03/04/2014 with Dopplers that suggested in-stent restenosis performed 05/23/2018 although the patient denies claudication.  Left-sided carotid artery disease s/p LCCA PTA/Stent 04/28/14 History of carotid artery disease status post ostial left common carotid artery stenting by myself 04/28/2014 with carotid Dopplers performed 05/23/2018 suggesting a patent stent.  Subclavian artery stenosis, left (HCC) History of 80% left subclavian artery stenosis demonstrated at the time of angiography in 2016 when I stented his  ostial left common carotid artery with Dopplers performed in January of this year that did not show elevated velocities.  He denies left upper extremity symptoms and there is only a 10 mm differential in his upper extremity extremities, 0000000 systolic on the right and [id number] systolic on the left.      Lorretta Harp MD FACP,FACC,FAHA, [facility] 03/04/2019 2:32 PM

## 2019-03-04 NOTE — Assessment & Plan Note (Signed)
History of 80% left subclavian artery stenosis demonstrated at the time of angiography in 2016 when I stented his ostial left common carotid artery with Dopplers performed in January of this year that did not show elevated velocities.  He denies left upper extremity symptoms and there is only a 10 mm differential in his upper extremity extremities, 0000000 systolic on the right and [id number] systolic on the left.

## 2019-03-04 NOTE — Assessment & Plan Note (Signed)
History of carotid artery disease status post ostial left common carotid artery stenting by myself 04/28/2014 with carotid Dopplers performed 05/23/2018 suggesting a patent stent.

## 2019-03-04 NOTE — Assessment & Plan Note (Signed)
History of essential hypertension with blood pressure measured today 161/78.  He is on lisinopril.

## 2019-03-04 NOTE — Assessment & Plan Note (Signed)
History of hyperlipidemia on statin therapy with lipid profile performed 02/14/2018 revealing a total cholesterol 144, LDL 73 and HDL 53.

## 2019-03-04 NOTE — Patient Instructions (Signed)
Medication Instructions:  Your physician recommends that you continue on your current medications as directed. Please refer to the Current Medication list given to you today.  If you need a refill on your cardiac medications before your next appointment, please call your pharmacy.   Lab work: NONE  Testing/Procedures: Your physician has requested that you have a carotid duplex in ONE YEAR. This test is an ultrasound of the carotid arteries in your neck. It looks at blood flow through these arteries that supply the brain with blood. Allow one hour for this exam. There are no restrictions or special instructions.  Your physician has requested that you have a lower extremity arterial exercise duplex with ABI IN ONE YEAR. During this test, exercise and ultrasound are used to evaluate arterial blood flow in the legs. Allow one hour for this exam. There are no restrictions or special instructions.   Follow-Up: At [facility], you and your health needs are our priority.  As part of our continuing mission to provide you with exceptional heart care, we have created designated Provider Care Teams.  These Care Teams include your primary Cardiologist (physician) and Advanced Practice Providers (APPs -  Physician Assistants and Nurse Practitioners) who all work together to provide you with the care you need, when you need it. You may see Dr Gwenlyn Found or one of the following Advanced Practice Providers on your designated Care Team:    Kerin Ransom, PA-C  [place], [place]  Coletta Memos, [place]  Your physician wants you to follow-up in: [place]. You will receive a reminder letter in the mail two months in advance. If you don't receive a letter, please call our office to schedule the follow-up appointment.

## 2019-03-30 ENCOUNTER — Encounter: Payer: Self-pay | Admitting: Family Medicine

## 2019-03-30 ENCOUNTER — Other Ambulatory Visit: Payer: Self-pay

## 2019-03-30 ENCOUNTER — Ambulatory Visit (INDEPENDENT_AMBULATORY_CARE_PROVIDER_SITE_OTHER): Payer: Medicare Other | Admitting: Family Medicine

## 2019-03-30 VITALS — BP 144/78 | HR 72 | Temp 98.2°F | Resp 14 | Ht 71.0 in | Wt 179.0 lb

## 2019-03-30 DIAGNOSIS — I10 Essential (primary) hypertension: Secondary | ICD-10-CM

## 2019-03-30 DIAGNOSIS — M67449 Ganglion, unspecified hand: Secondary | ICD-10-CM | POA: Diagnosis not present

## 2019-03-30 DIAGNOSIS — I6522 Occlusion and stenosis of left carotid artery: Secondary | ICD-10-CM | POA: Diagnosis not present

## 2019-03-30 DIAGNOSIS — Z125 Encounter for screening for malignant neoplasm of prostate: Secondary | ICD-10-CM

## 2019-03-30 DIAGNOSIS — E785 Hyperlipidemia, unspecified: Secondary | ICD-10-CM | POA: Diagnosis not present

## 2019-03-30 DIAGNOSIS — I739 Peripheral vascular disease, unspecified: Secondary | ICD-10-CM

## 2019-03-30 MED ORDER — ATORVASTATIN CALCIUM 80 MG PO TABS: 80.0000 mg | ORAL_TABLET | Freq: Every day | ORAL | 2 refills | Status: DC

## 2019-03-30 MED ORDER — LISINOPRIL 10 MG PO TABS: 10.0000 mg | ORAL_TABLET | Freq: Every day | ORAL | 2 refills | Status: DC

## 2019-03-30 NOTE — Progress Notes (Signed)
   Subjective:    Patient ID: Jesus Klein, male    DOB: 05-10-1950, 68 y.o.   MRN: [id number]  Patient presents for Follow-up (is not fasting) Patient here to follow-up chronic medical problems.  He is typically seen once a year.  My first time meeting him.  History reviewed He is followed by cardiology secondary to history of carotid artery disease as well as peripheral vascular disease.  He has a stent in his lower extremities.  He is also had seizures down to the carotids.  He is currently on Plavix he is also on statin drug Lipitor 80 mg daily.  Hypertension he checks his blood pressure at home it is typically the 130s over 70s to 80s.  States it is always a little higher when he goes into the office.  He is taking his lisinopril 10 mg as prescribed no chest pain shortness of breath. He does have a nodule on his right thumb that has been present for the past few months.  No particular injury does not cause any pain.  Review Of Systems:  GEN- denies fatigue, fever, weight loss,weakness, recent illness HEENT- denies eye drainage, change in vision, nasal discharge, CVS- denies chest pain, palpitations RESP- denies SOB, cough, wheeze ABD- denies N/V, change in stools, abd pain GU- denies dysuria, hematuria, dribbling, incontinence MSK- denies joint pain, muscle aches, injury Neuro- denies headache, dizziness, syncope, seizure activity       Objective:    BP (!) 144/78   Pulse 72   Temp 98.2 F (36.8 C) (Temporal)   Resp 14   Ht 5\' 11"  (1.803 m)   Wt 179 lb (81.2 kg)   SpO2 98%   BMI 24.97 kg/m  GEN- NAD, alert and oriented x3 HEENT- PERRL, EOMI, non injected sclera, pink conjunctiva, MMM, oropharynx clear Neck- Supple, no thyromegaly CVS- RRR, no murmur RESP-CTAB ABD-NABS,soft,NT,ND Skill skeletal right hand base of thumb small cyst nontender no erythema EXT- No edema Pulses- Radial, DP- 2+        Assessment & Plan:      Problem List Items Addressed This Visit       Unprioritized   Essential hypertension - Primary    Continue lisinopril 10 mg daily.  We will check his fasting labs.  He is follows with cardiology yearly. Blood pressure is at goal at home.  We will check his for function as well as his lipid panel on statin drug.  Has known peripheral vascular disease currently asymptomatic improved with intervention.  He also quit smoking years ago.  Discussed PSA screening will proceed with this he is asymptomatic with regards from any urinary symptoms      Relevant Orders   CBC with Differential   Comprehensive metabolic panel   Hyperlipidemia   Relevant Orders   Lipid Panel   Prostate cancer screening   Relevant Orders   PSA   PVD - Carotid, subclavian, bilat LE disease    Other Visit Diagnoses    Ganglion cyst of finger       ganglion vs mucouis cyst, not causing pain, prefers to monitor at this time      Note: This dictation was prepared with Dragon dictation along with smaller phrase technology. Any transcriptional errors that result from this process are unintentional.

## 2019-03-30 NOTE — Patient Instructions (Signed)
F/U 1 year for physical  

## 2019-03-30 NOTE — Assessment & Plan Note (Signed)
Continue lisinopril 10 mg daily.  We will check his fasting labs.  He is follows with cardiology yearly. Blood pressure is at goal at home.  We will check his for function as well as his lipid panel on statin drug.  Has known peripheral vascular disease currently asymptomatic improved with intervention.  He also quit smoking years ago.  Discussed PSA screening will proceed with this he is asymptomatic with regards from any urinary symptoms

## 2019-03-31 ENCOUNTER — Other Ambulatory Visit: Payer: Self-pay | Admitting: *Deleted

## 2019-03-31 DIAGNOSIS — E875 Hyperkalemia: Secondary | ICD-10-CM

## 2019-03-31 LAB — CBC WITH DIFFERENTIAL/PLATELET
Absolute Monocytes: 425 cells/uL (ref 200–950)
Basophils Absolute: 30 cells/uL (ref 0–200)
Basophils Relative: 0.6 %
Eosinophils Absolute: 130 cells/uL (ref 15–500)
Eosinophils Relative: 2.6 %
HCT: 47.7 % (ref 38.5–50.0)
Hemoglobin: 15.7 g/dL (ref 13.2–17.1)
Lymphs Abs: 2190 cells/uL (ref 850–3900)
MCH: 29.3 pg (ref 27.0–33.0)
MCHC: 32.9 g/dL (ref 32.0–36.0)
MCV: 89.2 fL (ref 80.0–100.0)
MPV: 9.8 fL (ref 7.5–12.5)
Monocytes Relative: 8.5 %
Neutro Abs: 2225 cells/uL (ref 1500–7800)
Neutrophils Relative %: 44.5 %
Platelets: 310 10*3/uL (ref 140–400)
RBC: 5.35 10*6/uL (ref 4.20–5.80)
RDW: 12 % (ref 11.0–15.0)
Total Lymphocyte: 43.8 %
WBC: 5 10*3/uL (ref 3.8–10.8)

## 2019-03-31 LAB — COMPREHENSIVE METABOLIC PANEL
AG Ratio: 1.8 (calc) (ref 1.0–2.5)
ALT: 21 U/L (ref 9–46)
AST: 22 U/L (ref 10–35)
Albumin: 4.8 g/dL (ref 3.6–5.1)
Alkaline phosphatase (APISO): 62 U/L (ref 35–144)
BUN: 15 mg/dL (ref 7–25)
CO2: 27 mmol/L (ref 20–32)
Calcium: 10.4 mg/dL — ABNORMAL HIGH (ref 8.6–10.3)
Chloride: 104 mmol/L (ref 98–110)
Creat: 0.92 mg/dL (ref 0.70–1.25)
Globulin: 2.6 g/dL (calc) (ref 1.9–3.7)
Glucose, Bld: 91 mg/dL (ref 65–99)
Potassium: 5.7 mmol/L — ABNORMAL HIGH (ref 3.5–5.3)
Sodium: 140 mmol/L (ref 135–146)
Total Bilirubin: 0.6 mg/dL (ref 0.2–1.2)
Total Protein: 7.4 g/dL (ref 6.1–8.1)

## 2019-03-31 LAB — LIPID PANEL
Cholesterol: 147 mg/dL (ref <200)
HDL: 49 mg/dL (ref >=40)
LDL Cholesterol (Calc): 82 mg/dL (calc)
Non-HDL Cholesterol (Calc): 98 mg/dL (calc) (ref <130)
Total CHOL/HDL Ratio: 3 (calc) (ref <5.0)
Triglycerides: 79 mg/dL (ref <150)

## 2019-03-31 LAB — PSA: PSA: 1.2 ng/mL (ref <=4.0)

## 2019-03-31 MED ORDER — AMLODIPINE BESYLATE 5 MG PO TABS: 5.0000 mg | ORAL_TABLET | Freq: Every day | ORAL | 3 refills | Status: DC

## 2019-04-14 ENCOUNTER — Other Ambulatory Visit: Payer: Medicare Other

## 2019-04-15 LAB — BASIC METABOLIC PANEL
BUN: 16 mg/dL (ref 7–25)
CO2: 27 mmol/L (ref 20–32)
Calcium: 9.8 mg/dL (ref 8.6–10.3)
Chloride: 106 mmol/L (ref 98–110)
Creat: 0.89 mg/dL (ref 0.70–1.25)
Glucose, Bld: 89 mg/dL (ref 65–99)
Potassium: 4.9 mmol/L (ref 3.5–5.3)
Sodium: 141 mmol/L (ref 135–146)

## 2019-05-26 ENCOUNTER — Ambulatory Visit (HOSPITAL_COMMUNITY)
Admission: RE | Admit: 2019-05-26 | Discharge: 2019-05-26 | Disposition: A | Discharge status: Home / Self Care | Payer: Medicare Other | Source: Ambulatory Visit | Attending: Internal Medicine | Admitting: Internal Medicine

## 2019-05-26 ENCOUNTER — Ambulatory Visit (HOSPITAL_BASED_OUTPATIENT_CLINIC_OR_DEPARTMENT_OTHER)
Admission: RE | Admit: 2019-05-26 | Discharge: 2019-05-26 | Disposition: A | Discharge status: Home / Self Care | Payer: Medicare Other | Source: Ambulatory Visit | Attending: Cardiovascular Disease | Admitting: Cardiovascular Disease

## 2019-05-26 ENCOUNTER — Other Ambulatory Visit: Payer: Self-pay

## 2019-05-26 ENCOUNTER — Other Ambulatory Visit (HOSPITAL_COMMUNITY): Payer: Self-pay | Admitting: Cardiovascular Disease

## 2019-05-26 DIAGNOSIS — I739 Peripheral vascular disease, unspecified: Secondary | ICD-10-CM

## 2019-05-26 DIAGNOSIS — I771 Stricture of artery: Secondary | ICD-10-CM | POA: Diagnosis not present

## 2019-05-26 DIAGNOSIS — I6523 Occlusion and stenosis of bilateral carotid arteries: Secondary | ICD-10-CM

## 2019-05-26 DIAGNOSIS — I6522 Occlusion and stenosis of left carotid artery: Secondary | ICD-10-CM | POA: Insufficient documentation

## 2019-05-26 DIAGNOSIS — Z95828 Presence of other vascular implants and grafts: Secondary | ICD-10-CM

## 2019-05-27 ENCOUNTER — Other Ambulatory Visit: Payer: Self-pay

## 2019-05-27 DIAGNOSIS — I6522 Occlusion and stenosis of left carotid artery: Secondary | ICD-10-CM

## 2019-05-27 DIAGNOSIS — I739 Peripheral vascular disease, unspecified: Secondary | ICD-10-CM

## 2019-05-27 DIAGNOSIS — I771 Stricture of artery: Secondary | ICD-10-CM

## 2019-11-24 ENCOUNTER — Encounter: Payer: Self-pay | Admitting: Internal Medicine

## 2020-01-11 ENCOUNTER — Telehealth: Payer: Self-pay

## 2020-01-11 NOTE — Telephone Encounter (Signed)
LMTCB- need clarification if patient is currently still on Plavix- if so, patient will need to be rescheduled for an OV with APP or provider-

## 2020-01-12 NOTE — Telephone Encounter (Signed)
Patient returned call to the office- patient has not taken Plavix since May 2021 since MD advised to stop medication per pt; patient has been advised to keep PV appt; patient verbalized understanding of information;

## 2020-01-18 ENCOUNTER — Ambulatory Visit (AMBULATORY_SURGERY_CENTER): Payer: Medicare Other

## 2020-01-18 ENCOUNTER — Encounter: Payer: Self-pay | Admitting: Internal Medicine

## 2020-01-18 ENCOUNTER — Other Ambulatory Visit: Payer: Self-pay

## 2020-01-18 VITALS — Ht 71.0 in | Wt 183.0 lb

## 2020-01-18 DIAGNOSIS — Z86010 Personal history of colonic polyps: Secondary | ICD-10-CM

## 2020-01-18 MED ORDER — SUTAB 1479-225-188 MG PO TABS: 1.0000 | ORAL_TABLET | ORAL | 0 refills | Status: DC

## 2020-01-18 NOTE — Progress Notes (Signed)
No egg or soy allergy known to patient  No issues with past sedation with any surgeries or procedures No intubation problems in the past  No FH of Malignant Hyperthermia No diet pills per patient No home 02 use per patient  No blood thinners per patient  Pt denies issues with constipation  No A fib or A flutter  EMMI video via MyChart  COVID 19 guidelines implemented in PV today with Pt and RN  Coupon given to pt in PV today , Code to Pharmacy  COVID vaccines completed on 06/2019 per pt;  Due to the COVID-19 pandemic we are asking patients to follow these guidelines. Please only bring one care partner. Please be aware that your care partner may wait in the car in the parking lot or if they feel like they will be too hot to wait in the car, they may wait in the lobby on the 4th floor. All care partners are required to wear a mask the entire time (we do not have any that we can provide them), they need to practice social distancing, and we will do a Covid check for all patient's and care partners when you arrive. Also we will check their temperature and your temperature. If the care partner waits in their car they need to stay in the parking lot the entire time and we will call them on their cell phone when the patient is ready for discharge so they can bring the car to the front of the building. Also all patient's will need to wear a mask into building.  

## 2020-02-01 ENCOUNTER — Ambulatory Visit (AMBULATORY_SURGERY_CENTER): Payer: Medicare Other | Admitting: Internal Medicine

## 2020-02-01 ENCOUNTER — Encounter: Payer: Self-pay | Admitting: Internal Medicine

## 2020-02-01 ENCOUNTER — Other Ambulatory Visit: Payer: Self-pay

## 2020-02-01 ENCOUNTER — Other Ambulatory Visit: Payer: Self-pay | Admitting: Internal Medicine

## 2020-02-01 VITALS — BP 145/61 | HR 60 | Temp 98.0°F | Resp 15 | Ht 71.0 in | Wt 183.0 lb

## 2020-02-01 DIAGNOSIS — Z86010 Personal history of colonic polyps: Secondary | ICD-10-CM | POA: Diagnosis not present

## 2020-02-01 DIAGNOSIS — D122 Benign neoplasm of ascending colon: Secondary | ICD-10-CM | POA: Diagnosis not present

## 2020-02-01 DIAGNOSIS — D125 Benign neoplasm of sigmoid colon: Secondary | ICD-10-CM

## 2020-02-01 DIAGNOSIS — D124 Benign neoplasm of descending colon: Secondary | ICD-10-CM

## 2020-02-01 DIAGNOSIS — D123 Benign neoplasm of transverse colon: Secondary | ICD-10-CM

## 2020-02-01 MED ORDER — SODIUM CHLORIDE 0.9 % IV SOLN: 500.0000 mL | Freq: Once | INTRAVENOUS | Status: DC

## 2020-02-01 NOTE — Progress Notes (Signed)
Report to PACU, RN, vss, BBS= Clear.  

## 2020-02-01 NOTE — Op Note (Signed)
[facility] Patient Name: Jesus Klein Procedure Date: 02/01/2020 9:06 AM MRN: [id number] Endoscopist: Docia Chuck. Henrene Pastor , MD Age: 69 Referring MD:  Date of Birth: 24-Sep-1950 Gender: Male Account #: [id number] Procedure:                Colonoscopy with cold snare polypectomy x 8 Indications:              High risk colon cancer surveillance: Personal                            history of multiple (3 or more) adenomas. Previous                            examination March 2018 (6 polyps) Medicines:                Monitored Anesthesia Care Procedure:                Pre-Anesthesia Assessment:                           - Prior to the procedure, a History and Physical                            was performed, and patient medications and                            allergies were reviewed. The patient's tolerance of                            previous anesthesia was also reviewed. The risks                            and benefits of the procedure and the sedation                            options and risks were discussed with the patient.                            All questions were answered, and informed consent                            was obtained. Prior Anticoagulants: The patient has                            taken no previous anticoagulant or antiplatelet                            agents. ASA Grade Assessment: II - A patient with                            mild systemic disease. After reviewing the risks                            and benefits, the patient was deemed in  satisfactory condition to undergo the procedure.                           After obtaining informed consent, the colonoscope                            was passed under direct vision. Throughout the                            procedure, the patient's blood pressure, pulse, and                            oxygen saturations were monitored continuously. The                             Colonoscope was introduced through the anus and                            advanced to the the cecum, identified by                            appendiceal orifice and ileocecal valve. The                            ileocecal valve, appendiceal orifice, and rectum                            were photographed. The quality of the bowel                            preparation was good. The colonoscopy was performed                            without difficulty. The patient tolerated the                            procedure well. The bowel preparation used was                            SUPREP via split dose instruction. Scope In: 9:15:46 AM Scope Out: 9:39:37 AM Scope Withdrawal Time: 0 hours 21 minutes 7 seconds  Total Procedure Duration: 0 hours 23 minutes 51 seconds  Findings:                 Eight polyps were found in the sigmoid colon,                            descending colon, transverse colon and ascending                            colon. The polyps were 1 to 5 mm in size. These                            polyps were removed with a cold snare. Resection  and retrieval were complete.                           A few diverticula were found in the sigmoid and                            right colon.                           The exam was otherwise without abnormality on                            direct and retroflexion views. Complications:            No immediate complications. Estimated blood loss:                            None. Estimated Blood Loss:     Estimated blood loss: none. Impression:               - Eight 1 to 5 mm polyps in the sigmoid colon, in                            the descending colon, in the transverse colon and                            in the ascending colon, removed with a cold snare.                            Resected and retrieved.                           - Diverticulosis in the sigmoid and right colon.                            - The examination was otherwise normal on direct                            and retroflexion views. Recommendation:           - Repeat colonoscopy in 3 years for surveillance.                           - Patient has a contact number available for                            emergencies. The signs and symptoms of potential                            delayed complications were discussed with the                            patient. Return to normal activities tomorrow.                            Written discharge instructions were  provided to the                            patient.                           - Resume previous diet.                           - Continue present medications.                           - Await pathology results. Docia Chuck. Henrene Pastor, MD 02/01/2020 9:46:20 AM This report has been signed electronically.

## 2020-02-01 NOTE — Progress Notes (Signed)
Called to room to assist during endoscopic procedure.  Patient ID and intended procedure confirmed with present staff. Received instructions for my participation in the procedure from the performing physician.  

## 2020-02-01 NOTE — Progress Notes (Signed)
Pt. Reports no change in his medical or surgical history since his pre-visit 01/18/2020.

## 2020-02-01 NOTE — Patient Instructions (Signed)
Handouts provided on polyps and diverticulosis.  ? ?YOU HAD AN ENDOSCOPIC PROCEDURE TODAY AT THE [facility]:   Refer to the procedure report that was given to you for any specific questions about what was found during the examination.  If the procedure report does not answer your questions, please call your gastroenterologist to clarify.  If you requested that your care partner not be given the details of your procedure findings, then the procedure report has been included in a sealed envelope for you to review at your convenience later. ? ?YOU SHOULD EXPECT: Some feelings of bloating in the abdomen. Passage of more gas than usual.  Walking can help get rid of the air that was put into your GI tract during the procedure and reduce the bloating. If you had a lower endoscopy (such as a colonoscopy or flexible sigmoidoscopy) you may notice spotting of blood in your stool or on the toilet paper. If you underwent a bowel prep for your procedure, you may not have a normal bowel movement for a few days. ? ?Please Note:  You might notice some irritation and congestion in your nose or some drainage.  This is from the oxygen used during your procedure.  There is no need for concern and it should clear up in a day or so. ? ?SYMPTOMS TO REPORT IMMEDIATELY: ? ?Following lower endoscopy (colonoscopy or flexible sigmoidoscopy): ? Excessive amounts of blood in the stool ? Significant tenderness or worsening of abdominal pains ? Swelling of the abdomen that is new, acute ? Fever of 100?F or higher ? ?For urgent or emergent issues, a gastroenterologist can be reached at any hour by calling [phone]. ?Do not use MyChart messaging for urgent concerns.  ? ? ?DIET:  We do recommend a small meal at first, but then you may proceed to your regular diet.  Drink plenty of fluids but you should avoid alcoholic beverages for 24 hours. ? ?ACTIVITY:  You should plan to take it easy for the rest of today and you should NOT  DRIVE or use heavy machinery until tomorrow (because of the sedation medicines used during the test).   ? ?FOLLOW UP: ?Our staff will call the number listed on your records 48-72 hours following your procedure to check on you and address any questions or concerns that you may have regarding the information given to you following your procedure. If we do not reach you, we will leave a message.  We will attempt to reach you two times.  During this call, we will ask if you have developed any symptoms of COVID 19. If you develop any symptoms (ie: fever, flu-like symptoms, shortness of breath, cough etc.) before then, please call [phone].  If you test positive for Covid 19 in the 2 weeks post procedure, please call and report this information to us.   ? ?If any biopsies were taken you will be contacted by phone or by letter within the next 1-3 weeks.  Please call us at [phone] if you have not heard about the biopsies in 3 weeks.  ? ? ?SIGNATURES/CONFIDENTIALITY: ?You and/or your care partner have signed paperwork which will be entered into your electronic medical record.  These signatures attest to the fact that that the information above on your After Visit Summary has been reviewed and is understood.  Full responsibility of the confidentiality of this discharge information lies with you and/or your care-partner. ? ?

## 2020-02-03 ENCOUNTER — Telehealth: Payer: Self-pay

## 2020-02-03 NOTE — Telephone Encounter (Signed)
  Follow up Call-  Call back number 02/01/2020  Post procedure Call Back phone  # [phone]  Permission to leave phone message Yes  Some recent data might be hidden     Patient questions:  Do you have a fever, pain , or abdominal swelling? No. Pain Score  0 *  Have you tolerated food without any problems? Yes.    Have you been able to return to your normal activities? Yes.    Do you have any questions about your discharge instructions: Diet   No. Medications  No. Follow up visit  No.  Do you have questions or concerns about your Care? No.  Actions: * If pain score is 4 or above: No action needed, pain <4.  1. Have you developed a fever since your procedure? no  2.   Have you had an respiratory symptoms (SOB or cough) since your procedure? no  3.   Have you tested positive for COVID 19 since your procedure no  4.   Have you had any family members/close contacts diagnosed with the COVID 19 since your procedure?  no   If yes to any of these questions please route to Joylene John, RN and Joella Prince, RN

## 2020-02-04 ENCOUNTER — Encounter: Payer: Self-pay | Admitting: Internal Medicine

## 2020-02-26 ENCOUNTER — Other Ambulatory Visit: Payer: Self-pay | Admitting: Family Medicine

## 2020-03-14 ENCOUNTER — Other Ambulatory Visit: Payer: Self-pay | Admitting: Family Medicine

## 2020-03-20 ENCOUNTER — Other Ambulatory Visit: Payer: Self-pay | Admitting: Family Medicine

## 2020-03-25 ENCOUNTER — Ambulatory Visit (INDEPENDENT_AMBULATORY_CARE_PROVIDER_SITE_OTHER): Payer: Medicare Other | Admitting: Family Medicine

## 2020-03-25 ENCOUNTER — Encounter: Payer: Self-pay | Admitting: Family Medicine

## 2020-03-25 ENCOUNTER — Other Ambulatory Visit: Payer: Self-pay

## 2020-03-25 VITALS — BP 138/64 | HR 62 | Temp 98.1°F | Resp 14 | Ht 71.0 in | Wt 182.0 lb

## 2020-03-25 DIAGNOSIS — E559 Vitamin D deficiency, unspecified: Secondary | ICD-10-CM | POA: Diagnosis not present

## 2020-03-25 DIAGNOSIS — Z0001 Encounter for general adult medical examination with abnormal findings: Secondary | ICD-10-CM | POA: Diagnosis not present

## 2020-03-25 DIAGNOSIS — Z125 Encounter for screening for malignant neoplasm of prostate: Secondary | ICD-10-CM

## 2020-03-25 DIAGNOSIS — E785 Hyperlipidemia, unspecified: Secondary | ICD-10-CM

## 2020-03-25 DIAGNOSIS — I739 Peripheral vascular disease, unspecified: Secondary | ICD-10-CM | POA: Diagnosis not present

## 2020-03-25 DIAGNOSIS — I10 Essential (primary) hypertension: Secondary | ICD-10-CM | POA: Diagnosis not present

## 2020-03-25 DIAGNOSIS — Z0000 Encounter for general adult medical examination without abnormal findings: Secondary | ICD-10-CM

## 2020-03-25 MED ORDER — ATORVASTATIN CALCIUM 80 MG PO TABS: 80.0000 mg | ORAL_TABLET | Freq: Every day | ORAL | 2 refills | Status: DC

## 2020-03-25 MED ORDER — CLOPIDOGREL BISULFATE 75 MG PO TABS: 75.0000 mg | ORAL_TABLET | Freq: Every day | ORAL | 1 refills | Status: DC

## 2020-03-25 MED ORDER — AMLODIPINE BESYLATE 5 MG PO TABS: 5.0000 mg | ORAL_TABLET | Freq: Every day | ORAL | 2 refills | Status: DC

## 2020-03-25 NOTE — Patient Instructions (Signed)
F/U 1 year for Physical

## 2020-03-25 NOTE — Progress Notes (Signed)
Subjective:   Patient presents for Medicare Annual/Subsequent preventive examination.   Patient here to follow-up chronic medical problems.  He is typically seen once a year.     History reviewed He is followed by cardiology secondary to history of carotid artery disease as well as peripheral vascular disease.  He has a stent in his lower extremities.      He is currently on Plavix he is also on statin drug Lipitor 80 mg daily.   He has been out of plavix for 2 weeks      Hypertension he checks his blood pressure at home it is typically the 130s over 70s to 80s.  States it is always a little higher when he goes into the office. No chest pain, or SOB   he is staying active       Due for PSA screening   He had colonoscopy   Review Past Medical/Family/Social: per EMR   Risk Factors  Current exercise habits: walks,stays active  Dietary issues discussed:  Low sodium/cholesterol   Cardiac risk factors: PVD, HTN   Depression Screen  (Note: if answer to either of the following is "Yes", a more complete depression screening is indicated)  Over the past two weeks, have you felt down, depressed or hopeless? No Over the past two weeks, have you felt little interest or pleasure in doing things? No Have you lost interest or pleasure in daily life? No Do you often feel hopeless? No Do you cry easily over simple problems? No   Activities of Daily Living  In your present state of health, do you have any difficulty performing the following activities?:  Driving? No  Managing money? No  Feeding yourself? No  Getting from bed to chair? No  Climbing a flight of stairs? No  Preparing food and eating?: No  Bathing or showering? No  Getting dressed: No  Getting to the toilet? No  Using the toilet:No  Moving around from place to place: No  In the past year have you fallen or had a near fall?:No    Hearing Difficulties: No  Do you often ask people to speak up or repeat themselves? No   Do you experience ringing or noises in your ears? No Do you have difficulty understanding soft or whispered voices? No  Do you feel that you have a problem with memory? No Do you often misplace items? No  Do you feel safe at home? Yes  Cognitive Testing  Alert? Yes Normal Appearance?Yes  Oriented to person? Yes Place? Yes  Time? Yes  Recall of three objects? Yes  Can perform simple calculations? Yes  Displays appropriate judgment?Yes  Can read the correct time from a watch face?Yes   List the Names of Other Physician/Practitioners you currently use:   Dr. Gwenlyn Found - next visit in Feb   [facility] 2 years [facility] -Dr. Henrene Pastor    Screening Tests / Date Colonoscopy    UTD                  Zostavax UTD  COVID-19 UTD  Influenza Vaccine  UTD  Tetanus/tdap UTD   Ros:  GEN- denies fatigue, fever, weight loss,weakness, recent illness HEENT- denies eye drainage, change in vision, nasal discharge, CVS- denies chest pain, palpitations RESP- denies SOB, cough, wheeze ABD- denies N/V, change in stools, abd pain GU- denies dysuria, hematuria, dribbling, incontinence MSK- denies joint pain, muscle aches, injury Neuro- denies headache, dizziness, syncope, seizure activity  PHYSICAL:  GEN- NAD, alert and oriented x3 HEENT- PERRL, EOMI, non injected sclera, pink conjunctiva, MMM, oropharynx clear Neck- Supple, no thryomegaly , BILAT bruit  CVS- RRR, no murmur RESP-CTAB ABD-NABS,soft,NT,ND  EXT- No edema Pulses- Radial 2+    Assessment:    Annual wellness medicare exam   Plan:    During the course of the visit the patient was educated and counseled about appropriate screening and preventive services including:   Prevention UTD, PSA screening to be done  HTN/PVD/ Carotid artery disease- controlled, plavix to be restarted, f/u cardiology in Feb, will have his yearly dupplex scans done  He is staying active   Vitamin D def, recheck levels   FALL/AUDIT C/  Depression screen neg  No changes to meds  F/U 1 year       Diet review for nutrition referral? Yes ____ Not Indicated __x__  Patient Instructions (the written plan) was given to the patient.  Medicare Attestation  I have personally reviewed:  The patient's medical and social history  Their use of alcohol, tobacco or illicit drugs  Their current medications and supplements  The patient's functional ability including ADLs,fall risks, home safety risks, cognitive, and hearing and visual impairment  Diet and physical activities  Evidence for depression or mood disorders  The patient's weight, height, BMI, and visual acuity have been recorded in the chart. I have made referrals, counseling, and provided education to the patient based on review of the above and I have provided the patient with a written personalized care plan for preventive services.

## 2020-03-26 LAB — CBC WITH DIFFERENTIAL/PLATELET
Absolute Monocytes: 418 cells/uL (ref 200–950)
Basophils Absolute: 8 cells/uL (ref 0–200)
Basophils Relative: 0.2 %
Eosinophils Absolute: 127 cells/uL (ref 15–500)
Eosinophils Relative: 3.1 %
HCT: 50.3 % — ABNORMAL HIGH (ref 38.5–50.0)
Hemoglobin: 16.5 g/dL (ref 13.2–17.1)
Lymphs Abs: 1558 cells/uL (ref 850–3900)
MCH: 29.6 pg (ref 27.0–33.0)
MCHC: 32.8 g/dL (ref 32.0–36.0)
MCV: 90.1 fL (ref 80.0–100.0)
MPV: 9.7 fL (ref 7.5–12.5)
Monocytes Relative: 10.2 %
Neutro Abs: 1989 cells/uL (ref 1500–7800)
Neutrophils Relative %: 48.5 %
Platelets: 290 10*3/uL (ref 140–400)
RBC: 5.58 10*6/uL (ref 4.20–5.80)
RDW: 12.4 % (ref 11.0–15.0)
Total Lymphocyte: 38 %
WBC: 4.1 10*3/uL (ref 3.8–10.8)

## 2020-03-26 LAB — PSA: PSA: 1.4 ng/mL (ref <=4.0)

## 2020-03-26 LAB — COMPREHENSIVE METABOLIC PANEL
AG Ratio: 2.1 (calc) (ref 1.0–2.5)
ALT: 25 U/L (ref 9–46)
AST: 24 U/L (ref 10–35)
Albumin: 4.8 g/dL (ref 3.6–5.1)
Alkaline phosphatase (APISO): 58 U/L (ref 35–144)
BUN: 13 mg/dL (ref 7–25)
CO2: 31 mmol/L (ref 20–32)
Calcium: 9.9 mg/dL (ref 8.6–10.3)
Chloride: 103 mmol/L (ref 98–110)
Creat: 0.86 mg/dL (ref 0.70–1.25)
Globulin: 2.3 g/dL (calc) (ref 1.9–3.7)
Glucose, Bld: 90 mg/dL (ref 65–99)
Potassium: 5.1 mmol/L (ref 3.5–5.3)
Sodium: 139 mmol/L (ref 135–146)
Total Bilirubin: 0.6 mg/dL (ref 0.2–1.2)
Total Protein: 7.1 g/dL (ref 6.1–8.1)

## 2020-03-26 LAB — LIPID PANEL
Cholesterol: 169 mg/dL (ref <200)
HDL: 57 mg/dL (ref >=40)
LDL Cholesterol (Calc): 91 mg/dL (calc)
Non-HDL Cholesterol (Calc): 112 mg/dL (calc) (ref <130)
Total CHOL/HDL Ratio: 3 (calc) (ref <5.0)
Triglycerides: 117 mg/dL (ref <150)

## 2020-03-26 LAB — VITAMIN D 25 HYDROXY (VIT D DEFICIENCY, FRACTURES): Vit D, 25-Hydroxy: 54 ng/mL (ref 30–100)

## 2020-03-29 ENCOUNTER — Encounter: Payer: Self-pay | Admitting: *Deleted

## 2020-05-24 ENCOUNTER — Other Ambulatory Visit: Payer: Self-pay

## 2020-05-24 ENCOUNTER — Encounter: Payer: Self-pay | Admitting: Cardiovascular Disease

## 2020-05-24 ENCOUNTER — Ambulatory Visit: Payer: Medicare Other | Admitting: Cardiovascular Disease

## 2020-05-24 VITALS — BP 130/80 | HR 74 | Ht 71.0 in | Wt 183.4 lb

## 2020-05-24 DIAGNOSIS — Z0000 Encounter for general adult medical examination without abnormal findings: Secondary | ICD-10-CM | POA: Diagnosis not present

## 2020-05-24 DIAGNOSIS — E785 Hyperlipidemia, unspecified: Secondary | ICD-10-CM | POA: Diagnosis not present

## 2020-05-24 DIAGNOSIS — I10 Essential (primary) hypertension: Secondary | ICD-10-CM | POA: Diagnosis not present

## 2020-05-24 DIAGNOSIS — Z87891 Personal history of nicotine dependence: Secondary | ICD-10-CM | POA: Diagnosis not present

## 2020-05-24 DIAGNOSIS — I739 Peripheral vascular disease, unspecified: Secondary | ICD-10-CM

## 2020-05-24 DIAGNOSIS — E782 Mixed hyperlipidemia: Secondary | ICD-10-CM | POA: Diagnosis not present

## 2020-05-24 MED ORDER — EZETIMIBE 10 MG PO TABS: 10.0000 mg | ORAL_TABLET | Freq: Every day | ORAL | 3 refills | Status: DC

## 2020-05-24 NOTE — Assessment & Plan Note (Signed)
History of essential hypertension a blood pressure measured today 130/80.  He is on amlodipine.

## 2020-05-24 NOTE — Progress Notes (Signed)
05/24/2020 Jesus Klein   [birth year]  [id number]  Primary Physician Alycia Rossetti, MD Primary Cardiologist: Lorretta Harp MD Lupe Carney, [place]  HPI:  Jesus Klein is a 70 y.o.  thin-appearing Caucasian male father of 2 children, grandfather and 2 grandchildren referred by Karis Juba PA-C for peripheral vascular evaluation.I last saw him in the office  03/04/2019. I initially saw him in the office 02/12/14. He had Dopplers performed in the office today that showed high-grade ostial carotid, ostial/subclavian artery stenosis as well as an occluded left common iliac and high-grade right iliac stenosis. His cardiovascular risk factor profile is notable for a 30-pack-year history of tobacco abuse having stopped smoking 2 years ago as well as 2 hypertension. There is no family history of heart disease. He does have mild hyperlipidemia. He has never had a heart attack or stroke. Denies chest pain or shortness of breath. He did have a right total hip replacement performed by Dr. Sharol Given in 2007. He noticed lifestyle limiting claudication 6 months ago right greater than left. On 03/04/14 I performed aortic arch angiography, selective left carotid angiography, abdominal aortography as well as right common iliac and external iliac intervention. His right lower extremity claudication has completely resolved. His ABI has normalized. He does not notice claudication as much in his left leg. Because of his high grade near ostial left common carotid artery stenosis heunderwent PTCA and stenting on 04/28/14 with a 7 mm x 19 mm long Omnilink balloon expandable stent with excellent result. Follow-uplower extremity Dopplers and carotid Dopplers performed 05/23/17 suggested in-stent restenosis within the iliac stents are widely patent carotid stent.  His most recent Dopplers performed in January 2020 revealed patent carotid stent, and moderate in-stent restenosis within the right iliac stent although he denies  claudication.  Since I saw him in the office a year ago he continues to do well.  He denies claudication, chest pain or shortness of breath.   Current Meds  Medication Sig  . acetaminophen (TYLENOL) 500 MG tablet Take 1,000 mg by mouth every 6 (six) hours as needed (pain).  Marland Kitchen amLODipine (NORVASC) 5 MG tablet Take 1 tablet (5 mg total) by mouth daily.  Marland Kitchen atorvastatin (LIPITOR) 80 MG tablet Take 1 tablet (80 mg total) by mouth daily.  . cetirizine (ZYRTEC) 10 MG tablet Take 10 mg by mouth daily as needed.   . Cholecalciferol (VITAMIN D) 125 MCG (5000 UT) CAPS Take 5,000 Units by mouth daily.   . clopidogrel (PLAVIX) 75 MG tablet Take 1 tablet (75 mg total) by mouth daily. Dr.Ab Leaming  . Coenzyme Q10 300 MG CAPS Take 300 mg by mouth daily.   Marland Kitchen ezetimibe (ZETIA) 10 MG tablet Take 1 tablet (10 mg total) by mouth daily.  . Multiple Vitamin (MULTIVITAMIN WITH MINERALS) TABS tablet Take 1 tablet by mouth daily.  . naproxen sodium (ALEVE) 220 MG tablet Take 220 mg by mouth 2 (two) times daily as needed (pain).   . polyvinyl alcohol (LIQUIFILM TEARS) 1.4 % ophthalmic solution Place 1 drop into both eyes daily as needed for dry eyes (allergies).   Current Facility-Administered Medications for the 05/24/20 encounter (Office Visit) with Lorretta Harp, MD  Medication  . 0.9 %  sodium chloride infusion     No Known Allergies  Social History   Socioeconomic History  . Marital status: Married    Spouse name: Not on file  . Number of children: Not on file  . Years of  education: Not on file  . Highest education level: Not on file  Occupational History  . Not on file  Tobacco Use  . Smoking status: Former Smoker    Packs/day: 1.50    Years: 30.00    Pack years: 45.00    Types: Cigarettes    Quit date: 01/19/2009    Years since quitting: 11.3  . Smokeless tobacco: Never Used  Vaping Use  . Vaping Use: Never used  Substance and Sexual Activity  . Alcohol use: Yes    Alcohol/week: 2.0  standard drinks    Types: 2 Cans of beer per week  . Drug use: No  . Sexual activity: Yes    Birth control/protection: None  Other Topics Concern  . 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 He may occasionally lift 20 pounds but most is done by machine.    Mix of standing, sitting, moving around.      Used to walk a lot and do a lot outside. Got a treadmill to use during cold weather.   Married.   2 daughters- grown.         Social Determinants of Health   Financial Resource Strain: Not on file  Food Insecurity: Not on file  Transportation Needs: Not on file  Physical Activity: Not on file  Stress: Not on file  Social Connections: Not on file  Intimate Partner Violence: Not on file     Review of Systems: General: negative for chills, fever, night sweats or weight changes.  Cardiovascular: negative for chest pain, dyspnea on exertion, edema, orthopnea, palpitations, paroxysmal nocturnal dyspnea or shortness of breath Dermatological: negative for rash Respiratory: negative for cough or wheezing Urologic: negative for hematuria Abdominal: negative for nausea, vomiting, diarrhea, bright red blood per rectum, melena, or hematemesis Neurologic: negative for visual changes, syncope, or dizziness All other systems reviewed and are otherwise negative except as noted above.    Blood pressure 130/80, pulse 74, height 5\' 11"  (1.803 m), weight 183 lb 6.4 oz (83.2 kg).  General appearance: alert and no distress Neck: no adenopathy, no JVD, supple, symmetrical, trachea midline, thyroid not enlarged, symmetric, no tenderness/mass/nodules and Bilateral carotid bruits Lungs: clear to auscultation bilaterally Heart: 2/6 outflow tract murmur consistent with aortic stenosis and/or sclerosis. Extremities: extremities normal, atraumatic, no cyanosis or edema Pulses: 2+ and symmetric Diminished left  pedal pulse Skin: Skin color, texture, turgor normal. No rashes or lesions Neurologic: Alert and oriented X 3, normal strength and tone. Normal symmetric reflexes. Normal coordination and gait  EKG sinus rhythm at 74 with septal Q waves and borderline voltage for LVH.  I personally reviewed this EKG.  ASSESSMENT AND PLAN:   Essential hypertension History of essential hypertension a blood pressure measured today 130/80.  He is on amlodipine.  Hyperlipidemia History of hyperlipidemia on high-dose atorvastatin with lipid profile performed 03/25/2020 revealing total cholesterol 169, LDL of 91 and HDL 57.  He is not at goal for secondary prevention.  I am going start him on Zetia 10 mg a day and we will recheck a lipid liver profile in 2 months  Claudication of lower extremity History of peripheral arterial disease with a known occluded left common iliac artery status post right common and external iliac artery stenting by myself 03/04/2014 with excellent clinical and Doppler result.  Recent lower extremity arterial Doppler studies performed a year ago suggest this  to be patent.  He denies claudication.  He is scheduled for Dopplers tomorrow.  PVD - Carotid, subclavian, bilat LE disease History of high-grade ostial left common carotid artery stenosis status post stenting which I performed 04/28/2014 with a 7 mm x 19 mm long Omnilink balloon expandable stent with an excellent result.  He did have an 80% proximal left subclavian artery stenosis but denies upper extremity claudication.  He has carotid Doppler scheduled for tomorrow.  History of tobacco abuse History remote tobacco abuse having quit in 2014.      Lorretta Harp MD FACP,FACC,FAHA, [facility] 05/24/2020 12:29 PM

## 2020-05-24 NOTE — Assessment & Plan Note (Signed)
History of high-grade ostial left common carotid artery stenosis status post stenting which I performed 04/28/2014 with a 7 mm x 19 mm long Omnilink balloon expandable stent with an excellent result.  He did have an 80% proximal left subclavian artery stenosis but denies upper extremity claudication.  He has carotid Doppler scheduled for tomorrow.

## 2020-05-24 NOTE — Assessment & Plan Note (Signed)
History remote tobacco abuse having quit in 2014.

## 2020-05-24 NOTE — Patient Instructions (Addendum)
Medication Instructions:  Start taking ezetimibe (Zetia) 10mg  once daily.   *If you need a refill on your cardiac medications before your next appointment, please call your pharmacy*   Lab Work: Your physician recommends that you return for lab work in: 2 months for lipid/liver profile.   If you have labs (blood work) drawn today and your tests are completely normal, you will receive your results only by: Marland Kitchen MyChart Message (if you have MyChart) OR . A paper copy in the mail If you have any lab test that is abnormal or we need to change your treatment, we will call you to review the results.   Testing/Procedures: Your physician has requested that you have an echocardiogram. Echocardiography is a painless test that uses sound waves to create images of your heart. It provides your doctor with information about the size and shape of your heart and how well your heart's chambers and valves are working. This procedure takes approximately one hour. There are no restrictions for this procedure. This procedure is done at [address]   Follow-Up: At [facility], you and your health needs are our priority.  As part of our continuing mission to provide you with exceptional heart care, we have created designated Provider Care Teams.  These Care Teams include your primary Cardiologist (physician) and Advanced Practice Providers (APPs -  Physician Assistants and Nurse Practitioners) who all work together to provide you with the care you need, when you need it.  We recommend signing up for the patient portal called "MyChart".  Sign up information is provided on this After Visit Summary.  MyChart is used to connect with patients for Virtual Visits (Telemedicine).  Patients are able to view lab/test results, encounter notes, upcoming appointments, etc.  Non-urgent messages can be sent to your provider as well.   To learn more about what you can do with MyChart, go to [email or web address].     Your next appointment:   12 month(s)  The format for your next appointment:   In Person  Provider:   Quay Burow, MD

## 2020-05-24 NOTE — Assessment & Plan Note (Signed)
History of peripheral arterial disease with a known occluded left common iliac artery status post right common and external iliac artery stenting by myself 03/04/2014 with excellent clinical and Doppler result.  Recent lower extremity arterial Doppler studies performed a year ago suggest this to be patent.  He denies claudication.  He is scheduled for Dopplers tomorrow.

## 2020-05-24 NOTE — Assessment & Plan Note (Signed)
History of hyperlipidemia on high-dose atorvastatin with lipid profile performed 03/25/2020 revealing total cholesterol 169, LDL of 91 and HDL 57.  He is not at goal for secondary prevention.  I am going start him on Zetia 10 mg a day and we will recheck a lipid liver profile in 2 months

## 2020-05-25 ENCOUNTER — Ambulatory Visit (HOSPITAL_BASED_OUTPATIENT_CLINIC_OR_DEPARTMENT_OTHER)
Admission: RE | Admit: 2020-05-25 | Discharge: 2020-05-25 | Disposition: A | Discharge status: Home / Self Care | Payer: Medicare Other | Source: Ambulatory Visit | Attending: Cardiovascular Disease | Admitting: Cardiovascular Disease

## 2020-05-25 ENCOUNTER — Ambulatory Visit (HOSPITAL_COMMUNITY)
Admission: RE | Admit: 2020-05-25 | Discharge: 2020-05-25 | Disposition: A | Discharge status: Home / Self Care | Payer: Medicare Other | Source: Ambulatory Visit | Attending: Cardiovascular Disease | Admitting: Cardiovascular Disease

## 2020-05-25 ENCOUNTER — Other Ambulatory Visit (HOSPITAL_COMMUNITY): Payer: Self-pay | Admitting: Cardiovascular Disease

## 2020-05-25 DIAGNOSIS — I739 Peripheral vascular disease, unspecified: Secondary | ICD-10-CM | POA: Insufficient documentation

## 2020-05-25 DIAGNOSIS — I6522 Occlusion and stenosis of left carotid artery: Secondary | ICD-10-CM

## 2020-05-25 DIAGNOSIS — Z95828 Presence of other vascular implants and grafts: Secondary | ICD-10-CM

## 2020-05-25 DIAGNOSIS — I771 Stricture of artery: Secondary | ICD-10-CM

## 2020-05-25 DIAGNOSIS — Z95820 Peripheral vascular angioplasty status with implants and grafts: Secondary | ICD-10-CM | POA: Diagnosis not present

## 2020-06-15 ENCOUNTER — Ambulatory Visit (HOSPITAL_COMMUNITY): Payer: Medicare Other | Attending: Cardiology

## 2020-06-15 ENCOUNTER — Other Ambulatory Visit: Payer: Self-pay

## 2020-06-15 DIAGNOSIS — E782 Mixed hyperlipidemia: Secondary | ICD-10-CM | POA: Diagnosis not present

## 2020-06-15 DIAGNOSIS — I10 Essential (primary) hypertension: Secondary | ICD-10-CM | POA: Diagnosis not present

## 2020-06-15 DIAGNOSIS — E785 Hyperlipidemia, unspecified: Secondary | ICD-10-CM | POA: Diagnosis not present

## 2020-06-15 LAB — ECHOCARDIOGRAM COMPLETE
Area-P 1/2: 2.34 cm2
S' Lateral: 3.55 cm

## 2020-07-28 DIAGNOSIS — E785 Hyperlipidemia, unspecified: Secondary | ICD-10-CM | POA: Diagnosis not present

## 2020-07-28 DIAGNOSIS — I739 Peripheral vascular disease, unspecified: Secondary | ICD-10-CM | POA: Diagnosis not present

## 2020-07-29 LAB — LIPID PANEL
Chol/HDL Ratio: 2.6 ratio (ref 0.0–5.0)
Cholesterol, Total: 129 mg/dL (ref 100–199)
HDL: 50 mg/dL (ref >39)
LDL Chol Calc (NIH): 65 mg/dL (ref 0–99)
Triglycerides: 68 mg/dL (ref 0–149)
VLDL Cholesterol Cal: 14 mg/dL (ref 5–40)

## 2020-07-29 LAB — HEPATIC FUNCTION PANEL
ALT: 29 IU/L (ref 0–44)
AST: 29 IU/L (ref 0–40)
Albumin: 4.8 g/dL (ref 3.8–4.8)
Alkaline Phosphatase: 83 IU/L (ref 44–121)
Bilirubin Total: 0.4 mg/dL (ref 0.0–1.2)
Bilirubin, Direct: 0.12 mg/dL (ref 0.00–0.40)
Total Protein: 7.2 g/dL (ref 6.0–8.5)

## 2020-08-01 ENCOUNTER — Telehealth: Payer: Self-pay | Admitting: Family Medicine

## 2020-08-01 NOTE — Progress Notes (Signed)
  Chronic Care Management   Outreach Note  08/01/2020 Name: Jesus Klein MRN: [id number] DOB: Jun 01, 1950  Referred by: Alycia Rossetti, MD Reason for referral : No chief complaint on file.   An unsuccessful telephone outreach was attempted today. The patient was referred to the pharmacist for assistance with care management and care coordination.   Follow Up Plan:   Carley Perdue [employer] Scheduler

## 2020-08-01 NOTE — Progress Notes (Signed)
  Chronic Care Management   Note  08/01/2020 Name: Jesus Klein MRN: [id number] DOB: March 02, 1951  Jesus Klein is a 70 y.o. year old male who is a primary care patient of [place], Modena Nunnery, MD. I reached out to [employer] by phone today in response to a referral sent by Mr. Alejandra Barna Mcgonagle's PCP, Buelah Manis, Modena Nunnery, MD.   Mr. Zahler was given information about Chronic Care Management services today including:  1. CCM service includes personalized support from designated clinical staff supervised by his physician, including individualized plan of care and coordination with other care providers 2. 24/7 contact phone numbers for assistance for urgent and routine care needs. 3. Service will only be billed when office clinical staff spend 20 minutes or more in a month to coordinate care. 4. Only one practitioner may furnish and bill the service in a calendar month. 5. The patient may stop CCM services at any time (effective at the end of the month) by phone call to the office staff.   Patient agreed to services and verbal consent obtained.   Follow up plan:   Carley Perdue [employer] Scheduler

## 2020-08-30 ENCOUNTER — Telehealth: Payer: Self-pay | Admitting: Pharmacist

## 2020-08-30 NOTE — Progress Notes (Addendum)
    Chronic Care Management Pharmacy Assistant   Name: Jesus Klein  MRN: [id number] DOB: 23-Aug-1950  Jesus Klein is an 70 y.o. year old male who presents for his initial CCM visit with the clinical pharmacist.  Reason for Encounter: Chart Prep   Conditions to be addressed/monitored: HTN, PVD, HLD, CAD.  Primary concerns for visit include: HTN  Recent office visits:  03/25/20 Dr. Buelah Manis For routine general medial exam. No medication changes.   Recent consult visits:  05/24/20 Cardiology Lorretta Harp, MD. For follow-up. STARTED Ezetimibe 10 mg daily.  Hospital visits:  None in previous 6 months  Medications: Outpatient Encounter Medications as of 08/30/2020  Medication Sig   acetaminophen (TYLENOL) 500 MG tablet Take 1,000 mg by mouth every 6 (six) hours as needed (pain).   amLODipine (NORVASC) 5 MG tablet Take 1 tablet (5 mg total) by mouth daily.   atorvastatin (LIPITOR) 80 MG tablet Take 1 tablet (80 mg total) by mouth daily.   cetirizine (ZYRTEC) 10 MG tablet Take 10 mg by mouth daily as needed.    Cholecalciferol (VITAMIN D) 125 MCG (5000 UT) CAPS Take 5,000 Units by mouth daily.    clopidogrel (PLAVIX) 75 MG tablet Take 1 tablet (75 mg total) by mouth daily. Dr.Berry   Coenzyme Q10 300 MG CAPS Take 300 mg by mouth daily.    ezetimibe (ZETIA) 10 MG tablet Take 1 tablet (10 mg total) by mouth daily.   Multiple Vitamin (MULTIVITAMIN WITH MINERALS) TABS tablet Take 1 tablet by mouth daily.   naproxen sodium (ALEVE) 220 MG tablet Take 220 mg by mouth 2 (two) times daily as needed (pain).    polyvinyl alcohol (LIQUIFILM TEARS) 1.4 % ophthalmic solution Place 1 drop into both eyes daily as needed for dry eyes (allergies).   Facility-Administered Encounter Medications as of 08/30/2020  Medication   0.9 %  sodium chloride infusion   Have you seen any other providers since your last visit? Patient stated no.  Any changes in your medications or health? Patient stated  no.  Any side effects from any medications? Patient stated no.  Do you have an symptoms or problems not managed by your medications? Patient stared no.  Any concerns about your health right now? Patient stated no.  Has your provider asked that you check blood pressure, blood sugar, or follow special diet at home? Patient stared no.  Do you get any type of exercise on a regular basis? Patient stated he uses a stationary bike 30 min daily, as well as he walks daily for about 2-3 miles.  Can you think of a goal you would like to reach for your health? Patient stated staying alive is good enough for him.   Do you have any problems getting your medications? Patient stated no.  Is there anything that you would like to discuss during the appointment? Patient stated no.  Please bring medications and supplements to appointment, patient reminded of 5/12 face to face appointment at 9 am.   [place], [place] Pharmacist Assistant [phone]

## 2020-09-01 ENCOUNTER — Ambulatory Visit (INDEPENDENT_AMBULATORY_CARE_PROVIDER_SITE_OTHER): Payer: Medicare Other | Admitting: Pharmacist

## 2020-09-01 ENCOUNTER — Other Ambulatory Visit: Payer: Self-pay

## 2020-09-01 DIAGNOSIS — I10 Essential (primary) hypertension: Secondary | ICD-10-CM | POA: Diagnosis not present

## 2020-09-01 DIAGNOSIS — E782 Mixed hyperlipidemia: Secondary | ICD-10-CM

## 2020-09-01 DIAGNOSIS — Z87891 Personal history of nicotine dependence: Secondary | ICD-10-CM

## 2020-09-01 NOTE — Progress Notes (Signed)
Chronic Care Management Pharmacy Note  09/01/2020 Name:  Jesus Klein MRN:  [id number] DOB:  Feb 22, 1951  Subjective: Jesus Klein is an 70 y.o. year old male who is a primary patient of Jesus Klein, Modena Nunnery, MD.  The CCM team was consulted for assistance with disease management and care coordination needs.    Engaged with patient face to face for initial visit in response to provider referral for pharmacy case management and/or care coordination services.   Consent to Services:  The patient was given the following information about Chronic Care Management services today, agreed to services, and gave verbal consent: 1. CCM service includes personalized support from designated clinical staff supervised by the primary care provider, including individualized plan of care and coordination with other care providers 2. 24/7 contact phone numbers for assistance for urgent and routine care needs. 3. Service will only be billed when office clinical staff spend 20 minutes or more in a month to coordinate care. 4. Only one practitioner may furnish and bill the service in a calendar month. 5.The patient may stop CCM services at any time (effective at the end of the month) by phone call to the office staff. 6. The patient will be responsible for cost sharing (co-pay) of up to 20% of the service fee (after annual deductible is met). Patient agreed to services and consent obtained.  Patient Care Team: Alycia Rossetti, MD as PCP - General (Family Medicine) Edythe Clarity, [facility] as Pharmacist (Pharmacist)  Recent office visits: 03/25/20 Dr. Buelah Manis For routine general medial exam. No medication changes.   Recent consult visits: 05/24/20 Cardiology Lorretta Harp, MD. For follow-up. STARTED Ezetimibe 10 mg daily  Hospital visits: None in previous 6 months  Objective:  Lab Results  Component Value Date   CREATININE 0.86 03/25/2020   BUN 13 03/25/2020   GFRNONAA 90 02/14/2018   GFRAA 105 02/14/2018    NA 139 03/25/2020   K 5.1 03/25/2020   CALCIUM 9.9 03/25/2020   CO2 31 03/25/2020   GLUCOSE 90 03/25/2020    No results found for: HGBA1C, FRUCTOSAMINE, GFR, MICROALBUR  Last diabetic Eye exam: No results found for: HMDIABEYEEXA  Last diabetic Foot exam: No results found for: HMDIABFOOTEX   Lab Results  Component Value Date   CHOL 129 07/28/2020   HDL 50 07/28/2020   LDLCALC 65 07/28/2020   TRIG 68 07/28/2020   CHOLHDL 2.6 07/28/2020    Hepatic Function Latest Ref Rng & Units 07/28/2020 03/25/2020 03/30/2019  Total Protein 6.0 - 8.5 g/dL 7.2 7.1 7.4  Albumin 3.8 - 4.8 g/dL 4.8 - -  AST 0 - 40 IU/L _0 ALT 0 - 44 IU/L _1 Alk Phosphatase 44 - 121 IU/L 83 - -  Total Bilirubin 0.0 - 1.2 mg/dL 0.4 0.6 0.6  Bilirubin, Direct 0.00 - 0.40 mg/dL 0.12 - -    Lab Results  Component Value Date/Time   TSH 1.35 11/29/2016 10:47 AM   TSH 1.24 11/07/2015 09:10 AM    CBC Latest Ref Rng & Units 03/25/2020 03/30/2019 02/14/2018  WBC 3.8 - 10.8 Thousand/uL 4.1 5.0 6.4  Hemoglobin 13.2 - 17.1 g/dL 16.5 15.7 15.7  Hematocrit 38.5 - 50.0 % 50.3(H) 47.7 48.2  Platelets 140 - 400 Thousand/uL 290 310 287    Lab Results  Component Value Date/Time   VD25OH 54 03/25/2020 09:59 AM   VD25OH 36 11/07/2015 09:10 AM    Clinical ASCVD: Yes  The ASCVD Risk  score Mikey Bussing DC Jr., et al., 2013) failed to calculate for the following reasons:   The valid total cholesterol range is 130 to 320 mg/dL    Depression screen [facility] 2/9 03/25/2020 03/30/2019 02/14/2018  Decreased Interest 0 0 0  Down, Depressed, Hopeless 0 0 0  PHQ - 2 Score 0 0 0  Altered sleeping - - -  Tired, decreased energy - - -  Change in appetite - - -  Feeling bad or failure about yourself  - - -  Trouble concentrating - - -  Moving slowly or fidgety/restless - - -  Suicidal thoughts - - -  PHQ-9 Score - - -  Difficult doing work/chores - - -     Social History   Tobacco Use  Smoking Status Former Smoker  . Packs/day:  1.50  . Years: 30.00  . Pack years: 45.00  . Types: Cigarettes  . Quit date: 01/19/2009  . Years since quitting: 11.6  Smokeless Tobacco Never Used   BP Readings from Last 3 Encounters:  05/24/20 130/80  03/25/20 138/64  02/01/20 (!) 145/61   Pulse Readings from Last 3 Encounters:  05/24/20 74  03/25/20 62  02/01/20 60   Wt Readings from Last 3 Encounters:  05/24/20 183 lb 6.4 oz (83.2 kg)  03/25/20 182 lb (82.6 kg)  02/01/20 183 lb (83 kg)   BMI Readings from Last 3 Encounters:  05/24/20 25.58 kg/m  03/25/20 25.38 kg/m  02/01/20 25.52 kg/m    Assessment/Interventions: Review of patient past medical history, allergies, medications, health status, including review of consultants reports, laboratory and other test data, was performed as part of comprehensive evaluation and provision of chronic care management services.   SDOH:  (Social Determinants of Health) assessments and interventions performed: Yes   Financial Resource Strain: Low Risk   . Difficulty of Paying Living Expenses: Not very hard    SDOH Screenings   Alcohol Screen: Low Risk   . Last Alcohol Screening Score (AUDIT): 1  Depression (PHQ2-9): Low Risk   . PHQ-2 Score: 0  Financial Resource Strain: Low Risk   . Difficulty of Paying Living Expenses: Not very hard  Food Insecurity: Not on file  Housing: Not on file  Physical Activity: Not on file  Social Connections: Not on file  Stress: Not on file  Tobacco Use: Medium Risk  . Smoking Tobacco Use: Former Smoker  . Smokeless Tobacco Use: Never Used  Transportation Needs: Not on file    [place]  No Known Allergies  Medications Reviewed Today    Reviewed by Lorretta Harp, MD (Physician) on 05/24/20 at 74  Med List Status: <None>  Medication Order Taking? Sig Documenting Provider Last Dose Status Informant  0.9 %  sodium chloride infusion [id number]   Irene Shipper, MD  Active   acetaminophen (TYLENOL) 500 MG tablet [id number] Yes Take  1,000 mg by mouth every 6 (six) hours as needed (pain). [provider] Taking Active Self  amLODipine (NORVASC) 5 MG tablet [id number] Yes Take 1 tablet (5 mg total) by mouth daily. [place], Modena Nunnery, MD Taking Active   atorvastatin (LIPITOR) 80 MG tablet [id number] Yes Take 1 tablet (80 mg total) by mouth daily. [place], Modena Nunnery, MD Taking Active   cetirizine (ZYRTEC) 10 MG tablet [id number] Yes Take 10 mg by mouth daily as needed.  [provider] Taking Active Self  Cholecalciferol (VITAMIN D) 125 MCG (5000 UT) CAPS [id number] Yes Take 5,000 Units by mouth daily.  [provider] Taking Active   clopidogrel (PLAVIX) 75 MG tablet [id number] Yes Take 1 tablet (75 mg total) by mouth daily. Dr.Berry Alycia Rossetti, MD Taking Active   Coenzyme Q10 300 MG CAPS [id number] Yes Take 300 mg by mouth daily.  [provider] Taking Active Self  ezetimibe (ZETIA) 10 MG tablet [id number] Yes Take 1 tablet (10 mg total) by mouth daily. Lorretta Harp, MD  Active   Multiple Vitamin (MULTIVITAMIN WITH MINERALS) TABS tablet [id number] Yes Take 1 tablet by mouth daily. [provider] Taking Active Self  naproxen sodium (ALEVE) 220 MG tablet [id number] Yes Take 220 mg by mouth 2 (two) times daily as needed (pain).  [provider] Taking Active Self  polyvinyl alcohol (LIQUIFILM TEARS) 1.4 % ophthalmic solution [id number] Yes Place 1 drop into both eyes daily as needed for dry eyes (allergies). [provider] Taking Active Self          Patient Active Problem List   Diagnosis Date Noted  . Subclavian artery stenosis, left ([place]) 05/29/2017  . Prostate cancer screening 10/07/2014  . History of tobacco abuse   . Left-sided carotid artery disease s/p LCCA PTA/Stent 04/28/14 04/28/2014  . PVD - Carotid, subclavian, bilat LE disease 03/05/2014  . S/P RCIA, RCFA, REIA PTA 03/04/14 03/05/2014  . Essential hypertension 02/02/2014  . Vitamin D deficiency  02/02/2014  . Hyperlipidemia 02/02/2014  . Claudication of lower extremity ([place]) 02/02/2014  . Bilateral carotid bruits 02/02/2014    Immunization History  Administered Date(s) Administered  . Fluad Quad(high Dose 65+) 12/23/2018, 02/05/2020  . Influenza,inj,Quad PF,6+ Mos 02/02/2014  . Influenza-Unspecified 03/03/2018  . PFIZER(Purple Top)SARS-COV-2 Vaccination 06/16/2019, 07/07/2019, 03/01/2020  . Pneumococcal Conjugate-13 04/23/2016  . Pneumococcal Polysaccharide-23 04/23/2017  . Tdap 09/02/2010  . Zoster 11/07/2015    Conditions to be addressed/monitored:  HTN, PVD, HLD, Tobacco Use  Care Plan : General Pharmacy (Adult)  Updates made by Edythe Clarity, RPH since 09/01/2020 12:00 AM    Problem: HTN, PVD, HLD, Tobacco Use   Priority: High  Onset Date: 08/31/2020    Long-Range Goal: Patient-Specific Goal   Start Date: 09/01/2020  Expected End Date: 03/04/2021  This Visit's Progress: On track  Priority: High  Note:   Current Barriers:  . none identified at this visit  Pharmacist Clinical Goal(s):  Marland Kitchen Patient will maintain control of BP and cholesterol as evidenced by home monitoring and routine labs  . adhere to prescribed medication regimen as evidenced by fill dates . contact provider office for questions/concerns as evidenced notation of same in electronic health record through collaboration with PharmD and provider.   Interventions: . 1:1 collaboration with Buelah Manis, Modena Nunnery, MD regarding development and update of comprehensive plan of care as evidenced by provider attestation and co-signature . Inter-disciplinary care team collaboration (see longitudinal plan of care) . Comprehensive medication review performed; medication list updated in electronic medical record  Hypertension (BP goal <140/90) -Controlled -Current treatment: . Amlodipine 37m daily -Medications previously tried: lisinopril (elevated K+)  -Current home readings: 130/70 normal  readings -Current dietary habits: eating less processed foods, has completely cut out sugar -Current exercise habits: stationary bike 20-30 minutes 7 days per week -Denies hypotensive/hypertensive symptoms -Educated on BP goals and benefits of medications for prevention of heart attack, stroke and kidney damage; Daily salt intake goal < 2300 mg; Importance of home blood pressure monitoring; Symptoms of hypotension and importance of maintaining adequate hydration; -Counseled to monitor BP at home daily, document, and  provide log at future appointments -Recommended to continue current medication  Hyperlipidemia/PVD: (LDL goal < 70) -Controlled -Current treatment: . Atorvastatin 29m daily . Ezetimibe 142mdaily . Clopidogrel 7516maily -Medications previously tried: none noted  -Current dietary patterns: see above -Current exercise habits: see above -Educated on Cholesterol goals;  Benefits of statin for ASCVD risk reduction; Importance of limiting foods high in cholesterol; Exercise goal of 150 minutes per week;  -Recently started Zetia 25m63mily and tolerating it well -Most recent LDL and lipid panel were excellent, congratulated patient on this -Recommended to continue current medication Recommended routine follow up and labs annually    Patient Goals/Self-Care Activities . Patient will:  - take medications as prescribed focus on medication adherence by pill box check blood pressure daily, document, and provide at future appointments target a minimum of 150 minutes of moderate intensity exercise weekly  Follow Up Plan: The care management team will reach out to the patient again over the next 180 days.        Medication Assistance: None required.  Patient affirms current coverage meets needs.  Patient's preferred pharmacy is:  [id number] [place] [address]: [phone]  [phone] pill box? Yes Pt endorses 100% compliance  We discussed: Benefits of medication synchronization, packaging and delivery as well as enhanced pharmacist oversight with [employer]. Patient decided to: Continue current medication management strategy  Care Plan and Follow Up Patient Decision:  Patient agrees to Care Plan and Follow-up.  Plan: The care management team will reach out to the patient again over the next 180 days.  ChrisBeverly MilchrmD Clinical Pharmacist [phone]

## 2020-09-01 NOTE — Patient Instructions (Addendum)
Visit Information  Goals Addressed            This Visit's Progress   . Track and Manage My Blood Pressure-Hypertension       Timeframe:  Long-Range Goal Priority:  High Start Date:   09/01/20                          Expected End Date:    03/04/21                   Follow Up Date 12/02/20    - check blood pressure weekly - choose a place to take my blood pressure (home, clinic or office, retail store) - write blood pressure results in a log or diary    Why is this important?    You won't feel high blood pressure, but it can still hurt your blood vessels.   High blood pressure can cause heart or kidney problems. It can also cause a stroke.   Making lifestyle changes like losing a little weight or eating less salt will help.   Checking your blood pressure at home and at different times of the day can help to control blood pressure.   If the doctor prescribes medicine remember to take it the way the doctor ordered.   Call the office if you cannot afford the medicine or if there are questions about it.     Notes:       Patient Care Plan: General Pharmacy (Adult)    Problem Identified: HTN, PVD, HLD, Tobacco Use   Priority: High  Onset Date: 08/31/2020    Long-Range Goal: Patient-Specific Goal   Start Date: 09/01/2020  Expected End Date: 03/04/2021  This Visit's Progress: On track  Priority: High  Note:   Current Barriers:  . none identified at this visit  Pharmacist Clinical Goal(s):  Jesus Klein Kitchen Patient will maintain control of BP and cholesterol as evidenced by home monitoring and routine labs  . adhere to prescribed medication regimen as evidenced by fill dates . contact provider office for questions/concerns as evidenced notation of same in electronic health record through collaboration with PharmD and provider.   Interventions: . 1:1 collaboration with Jesus Klein, Jesus Nunnery, MD regarding development and update of comprehensive plan of care as evidenced by provider  attestation and co-signature . Inter-disciplinary care team collaboration (see longitudinal plan of care) . Comprehensive medication review performed; medication list updated in electronic medical record  Hypertension (BP goal <140/90) -Controlled -Current treatment: . Amlodipine 5mg  daily -Medications previously tried: lisinopril (elevated K+)  -Current home readings: 130/70 normal readings -Current dietary habits: eating less processed foods, has completely cut out sugar -Current exercise habits: stationary bike 20-30 minutes 7 days per week -Denies hypotensive/hypertensive symptoms -Educated on BP goals and benefits of medications for prevention of heart attack, stroke and kidney damage; Daily salt intake goal < 2300 mg; Importance of home blood pressure monitoring; Symptoms of hypotension and importance of maintaining adequate hydration; -Counseled to monitor BP at home daily, document, and provide log at future appointments -Recommended to continue current medication  Hyperlipidemia/PVD: (LDL goal < 70) -Controlled -Current treatment: . Atorvastatin 80mg  daily . Ezetimibe 10mg  daily . Clopidogrel 75mg  daily -Medications previously tried: none noted  -Current dietary patterns: see above -Current exercise habits: see above -Educated on Cholesterol goals;  Benefits of statin for ASCVD risk reduction; Importance of limiting foods high in cholesterol; Exercise goal of 150 minutes per week;  -Recently started Zetia  10mg  daily and tolerating it well -Most recent LDL and lipid panel were excellent, congratulated patient on this -Recommended to continue current medication Recommended routine follow up and labs annually    Patient Goals/Self-Care Activities . Patient will:  - take medications as prescribed focus on medication adherence by pill box check blood pressure daily, document, and provide at future appointments target a minimum of 150 minutes of moderate intensity  exercise weekly  Follow Up Plan: The care management team will reach out to the patient again over the next 180 days.       Jesus Klein was given information about Chronic Care Management services today including:  1. CCM service includes personalized support from designated clinical staff supervised by his physician, including individualized plan of care and coordination with other care providers 2. 24/7 contact phone numbers for assistance for urgent and routine care needs. 3. Standard insurance, coinsurance, copays and deductibles apply for chronic care management only during months in which we provide at least 20 minutes of these services. Most insurances cover these services at 100%, however patients may be responsible for any copay, coinsurance and/or deductible if applicable. This service may help you avoid the need for more expensive face-to-face services. 4. Only one practitioner may furnish and bill the service in a calendar month. 5. The patient may stop CCM services at any time (effective at the end of the month) by phone call to the office staff.  Patient agreed to services and verbal consent obtained.   The patient verbalized understanding of instructions, educational materials, and care plan provided today and agreed to receive a mailed copy of patient instructions, educational materials, and care plan.  Telephone follow up appointment with pharmacy team member scheduled for:6 months  Edythe Clarity, [facility]  Preventing High Cholesterol Cholesterol is a white, waxy substance similar to fat that the human body needs to help build cells. The liver makes all the cholesterol that a person's body needs. Having high cholesterol (hypercholesterolemia) increases your risk for heart disease and stroke. Extra or excess cholesterol comes from the food that you eat. High cholesterol can often be prevented with diet and lifestyle changes. If you already have high cholesterol, you can control it  with diet, lifestyle changes, and medicines. How can high cholesterol affect me? If you have high cholesterol, fatty deposits (plaques) may build up on the walls of your blood vessels. The blood vessels that carry blood away from your heart are called arteries. Plaques make the arteries narrower and stiffer. This in turn can:  Restrict or block blood flow and cause blood clots to form.  Increase your risk for heart attack and stroke. What can increase my risk for high cholesterol? This condition is more likely to develop in people who:  Eat foods that are high in saturated fat or cholesterol. Saturated fat is mostly found in foods that come from animal sources.  Are overweight.  Are not getting enough exercise.  Have a family history of high cholesterol (familial hypercholesterolemia). What actions can I take to prevent this? Nutrition  Eat less saturated fat.  Avoid trans fats (partially hydrogenated oils). These are often found in margarine and in some baked goods, fried foods, and snacks bought in packages.  Avoid precooked or cured meat, such as bacon, sausages, or meat loaves.  Avoid foods and drinks that have added sugars.  Eat more fruits, vegetables, and whole grains.  Choose healthy sources of protein, such as fish, poultry, lean cuts of red  meat, beans, peas, lentils, and nuts.  Choose healthy sources of fat, such as: ? Nuts. ? Vegetable oils, especially olive oil. ? Fish that have healthy fats, such as omega-3 fatty acids. These fish include mackerel or salmon.   Lifestyle  Lose weight if you are overweight. Maintaining a healthy body mass index (BMI) can help prevent or control high cholesterol. It can also lower your risk for diabetes and high blood pressure. Ask your health care provider to help you with a diet and exercise plan to lose weight safely.  Do not use any products that contain nicotine or tobacco, such as cigarettes, e-cigarettes, and chewing tobacco.  If you need help quitting, ask your health care provider. Alcohol use  Do not drink alcohol if: ? Your health care provider tells you not to drink. ? You are pregnant, may be pregnant, or are planning to become pregnant.  If you drink alcohol: ? Limit how much you use to:  0-1 drink a day for women.  0-2 drinks a day for men. ? Be aware of how much alcohol is in your drink. In the [place] one drink equals one 12 oz bottle of beer (355 mL), one 5 oz glass of wine (148 mL), or one 1 oz glass of hard liquor (44 mL). Activity  Get enough exercise. Do exercises as told by your health care provider.  Each week, do at least 150 minutes of exercise that takes a medium level of effort (moderate-intensity exercise). This kind of exercise: ? Makes your heart beat faster while allowing you to still be able to talk. ? Can be done in short sessions several times a day or longer sessions a few times a week. For example, on 5 days each week, you could walk fast or ride your bike 3 times a day for 10 minutes each time.   Medicines  Your health care provider may recommend medicines to help lower cholesterol. This may be a medicine to lower the amount of cholesterol that your liver makes. You may need medicine if: ? Diet and lifestyle changes have not lowered your cholesterol enough. ? You have high cholesterol and other risk factors for heart disease or stroke.  Take over-the-counter and prescription medicines only as told by your health care provider. General information  Manage your risk factors for high cholesterol. Talk with your health care provider about all your risk factors and how to lower your risk.  Manage other conditions that you have, such as diabetes or high blood pressure (hypertension).  Have blood tests to check your cholesterol levels at regular points in time as told by your health care provider.  Keep all follow-up visits as told by your health care provider. This is  important. Where to find more information  American Heart Association: www.heart.org  National Heart, Lung, and Blood Institute: https://wilson-eaton.com/ Summary  High cholesterol increases your risk for heart disease and stroke. By keeping your cholesterol level low, you can reduce your risk for these conditions.  High cholesterol can often be prevented with diet and lifestyle changes.  Work with your health care provider to manage your risk factors, and have your blood tested regularly. This information is not intended to replace advice given to you by your health care provider. Make sure you discuss any questions you have with your health care provider. Document Revised: 01/20/2019 Document Reviewed: 01/20/2019 Elsevier Patient Education  [place].

## 2020-09-14 DIAGNOSIS — H401131 Primary open-angle glaucoma, bilateral, mild stage: Secondary | ICD-10-CM | POA: Diagnosis not present

## 2020-09-19 ENCOUNTER — Other Ambulatory Visit: Payer: Self-pay | Admitting: Family Medicine

## 2020-11-02 DIAGNOSIS — H401131 Primary open-angle glaucoma, bilateral, mild stage: Secondary | ICD-10-CM | POA: Diagnosis not present

## 2020-11-22 ENCOUNTER — Ambulatory Visit
Admission: RE | Admit: 2020-11-22 | Discharge: 2020-11-22 | Disposition: A | Discharge status: Home / Self Care | Payer: Medicare Other | Source: Ambulatory Visit | Attending: Family Medicine | Admitting: Family Medicine

## 2020-11-22 ENCOUNTER — Ambulatory Visit (INDEPENDENT_AMBULATORY_CARE_PROVIDER_SITE_OTHER): Payer: Medicare Other | Admitting: Family Medicine

## 2020-11-22 ENCOUNTER — Encounter: Payer: Self-pay | Admitting: Family Medicine

## 2020-11-22 ENCOUNTER — Ambulatory Visit
Admission: RE | Admit: 2020-11-22 | Discharge: 2020-11-22 | Disposition: A | Discharge status: Home / Self Care | Payer: Medicare Other | Attending: Family Medicine | Admitting: Family Medicine

## 2020-11-22 ENCOUNTER — Other Ambulatory Visit: Payer: Self-pay

## 2020-11-22 VITALS — BP 130/76 | HR 80 | Temp 98.5°F | Resp 14 | Ht 71.0 in | Wt 188.0 lb

## 2020-11-22 DIAGNOSIS — M47816 Spondylosis without myelopathy or radiculopathy, lumbar region: Secondary | ICD-10-CM | POA: Diagnosis not present

## 2020-11-22 DIAGNOSIS — M545 Low back pain, unspecified: Secondary | ICD-10-CM | POA: Diagnosis not present

## 2020-11-22 DIAGNOSIS — M5432 Sciatica, left side: Secondary | ICD-10-CM

## 2020-11-22 DIAGNOSIS — M791 Myalgia, unspecified site: Secondary | ICD-10-CM | POA: Diagnosis not present

## 2020-11-22 MED ORDER — PREDNISONE 20 MG PO TABS: ORAL_TABLET | ORAL | 0 refills | Status: DC

## 2020-11-22 NOTE — Progress Notes (Signed)
Subjective:    Patient ID: Jesus Klein, male    DOB: 11/02/1950, 70 y.o.   MRN: [id number]  HPI  Patient is a very pleasant 70 year old Caucasian gentleman who presents today for 2 separate issues.  First he has been having pain in his lower back roughly around the level of L4.  Pain originally was radiating down his right leg.  However the pain improved.  The pain never totally resolved and now it seems to be radiating.  He reports pain radiating into his left posterior gluteus, down his left, into his calf.  He denies any bowel or bladder incontinence.  He denies any saddle anesthesia.  He denies any leg weakness.  Reflexes today are normal muscle strength is 5 out of 5 equal and symmetric in the lower extremities.  This began a few months ago.  Around the same time he developed stiffness in both shoulders.  He reports pain with abduction greater than 100 degrees.  He has a positive empty can sign in the right shoulder.  He has some mild tenderness in the left shoulder with empty can testing.  There is some crepitus in both shoulders with passive range of motion.  There is no evidence of muscle weakness however Past Medical History:  Diagnosis Date   Allergy    seasonal allergies   Arthritis    arms/shoulders   Blood transfusion without reported diagnosis 2009   with hip replacement   History of tobacco abuse    Hyperlipidemia    on meds   Hypertension 2010   on meds   Left-sided carotid artery disease ([place])    high-grade ostial left common carotid artery stenosis   Peripheral arterial disease ([place])    a. 02/2014 - right common iliac and external iliac intervention. b. H/o PTA/stenting of left common carotid artery stenosis. Known 80% left subclavian artery stenosis without UE claudication/steal sx.   Subclavian artery stenosis, left [facility])    Past Surgical History:  Procedure Laterality Date   CAROTID STENT INSERTION N/A 04/28/2014   Procedure: CAROTID STENT INSERTION;  Surgeon: Serafina Mitchell, MD;  Location: [facility] CATH LAB;  Service: Cardiovascular;  Laterality: N/A;   ILIAC ARTERY STENT Right 03/04/2014   w/as well as right external iliac and common femoral artery stenting/notes 03/04/2014   JOINT REPLACEMENT     LOWER EXTREMITY ANGIOGRAM N/A 03/04/2014   Procedure: LOWER EXTREMITY ANGIOGRAM;  Surgeon: Lorretta Harp, MD;  Location: [facility] CATH LAB;  Service: Cardiovascular;  Laterality: N/A;   TOTAL HIP ARTHROPLASTY Right 10/2005   WISDOM TOOTH EXTRACTION     Current Outpatient Medications on File Prior to Visit  Medication Sig Dispense Refill   amLODipine (NORVASC) 5 MG tablet Take 1 tablet (5 mg total) by mouth daily. 90 tablet 2   atorvastatin (LIPITOR) 80 MG tablet Take 1 tablet (80 mg total) by mouth daily. 90 tablet 2   cetirizine (ZYRTEC) 10 MG tablet Take 10 mg by mouth daily as needed.      Cholecalciferol (VITAMIN D) 125 MCG (5000 UT) CAPS Take 5,000 Units by mouth daily.      clopidogrel (PLAVIX) 75 MG tablet TAKE 1 TABLET (75 MG TOTAL) BY MOUTH DAILY. 90 tablet 0   Coenzyme Q10 300 MG CAPS Take 300 mg by mouth daily.      ezetimibe (ZETIA) 10 MG tablet Take 1 tablet (10 mg total) by mouth daily. 90 tablet 3   latanoprost (XALATAN) 0.005 % ophthalmic solution 1 drop  at bedtime.     Multiple Vitamin (MULTIVITAMIN WITH MINERALS) TABS tablet Take 1 tablet by mouth daily.     polyvinyl alcohol (LIQUIFILM TEARS) 1.4 % ophthalmic solution Place 1 drop into both eyes daily as needed for dry eyes (allergies).     Current Facility-Administered Medications on File Prior to Visit  Medication Dose Route Frequency Provider Last Rate Last Admin   0.9 %  sodium chloride infusion  500 mL Intravenous Continuous Irene Shipper, MD       No Known Allergies Social History   Socioeconomic History   Marital status: Married    Spouse name: Not on file   Number of children: Not on file   Years of education: Not on file   Highest education level: Not on file  Occupational  History   Not on file  Tobacco Use   Smoking status: Former    Packs/day: 1.50    Years: 30.00    Pack years: 45.00    Types: Cigarettes    Quit date: 01/19/2009    Years since quitting: 11.8   Smokeless tobacco: Never  Vaping Use   Vaping Use: Never used  Substance and Sexual Activity   Alcohol use: Yes    Alcohol/week: 2.0 standard drinks    Types: 2 Cans of beer per week   Drug use: No   Sexual activity: Yes    Birth control/protection: None  Other Topics Concern   Not on file  Social History Narrative   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 He may occasionally lift 20 pounds but most is done by machine.    Mix of standing, sitting, moving around.      Used to walk a lot and do a lot outside. Got a treadmill to use during cold weather.   Married.   2 daughters- grown.         Social Determinants of Health   Financial Resource Strain: Low Risk    Difficulty of Paying Living Expenses: Not very hard  Food Insecurity: Not on file  Transportation Needs: Not on file  Physical Activity: Not on file  Stress: Not on file  Social Connections: Not on file  Intimate Partner Violence: Not on file     Review of Systems  All other systems reviewed and are negative.     Objective:   Physical Exam Vitals reviewed.  Constitutional:      Appearance: Normal appearance.  Cardiovascular:     Rate and Rhythm: Normal rate and regular rhythm.     Heart sounds: Normal heart sounds.  Pulmonary:     Effort: Pulmonary effort is normal.     Breath sounds: Normal breath sounds.  Musculoskeletal:     Right shoulder: Crepitus present. No bony tenderness. Decreased range of motion. Normal strength.     Left shoulder: Crepitus present. No bony tenderness. Decreased range of motion. Normal strength.     Lumbar back: No swelling, deformity, spasms, tenderness or bony tenderness. Decreased range of motion. Negative right straight leg  raise test and negative left straight leg raise test.       Legs:  Neurological:     Mental Status: He is alert.          Assessment & Plan:  Left sided sciatica - Plan: DG Lumbar Spine Complete  Myalgia - Plan: CK, Sedimentation rate I believe the patient likely has degenerative disc disease in the lumbar spine  causing nerve impingement left-sided sciatica.  I will treat him empirically with prednisone taper and obtain basic x-rays of the lumbar spine.  I do not believe shoulder pain is related to the leg pain.  However I will check a CK level to rule out statin myopathy and check a sed rate.  If shoulder pain is not improved with prednisone taper, I would recommend trying a subacromial cortisone injection for subacromial bursitis

## 2020-11-23 LAB — SEDIMENTATION RATE: Sed Rate: 2 mm/h (ref 0–20)

## 2020-11-23 LAB — CK: Total CK: 125 U/L (ref 44–196)

## 2020-11-28 ENCOUNTER — Telehealth: Payer: Self-pay | Admitting: Pharmacist

## 2020-11-28 NOTE — Progress Notes (Addendum)
Chronic Care Management Pharmacy Assistant   Name: Jesus Klein  MRN: [id number] DOB: 21-Jun-1950  Reason for Encounter: Disease State For  HTN.    Conditions to be addressed/monitored: HTN, PVD, HLD, Tobacco Use  Recent office visits:  11/22/20 Dr. Dennard Schaumann For back pain STARTED Prednisone 20 mg pack. STOPPED Acetaminophen and Naproxen. Per note: Xray's preformed.   Recent consult visits:  None since 09/01/20  Hospital visits:  None since 09/01/20  Medications: Outpatient Encounter Medications as of 11/28/2020  Medication Sig   amLODipine (NORVASC) 5 MG tablet Take 1 tablet (5 mg total) by mouth daily.   atorvastatin (LIPITOR) 80 MG tablet Take 1 tablet (80 mg total) by mouth daily.   cetirizine (ZYRTEC) 10 MG tablet Take 10 mg by mouth daily as needed.    Cholecalciferol (VITAMIN D) 125 MCG (5000 UT) CAPS Take 5,000 Units by mouth daily.    clopidogrel (PLAVIX) 75 MG tablet TAKE 1 TABLET (75 MG TOTAL) BY MOUTH DAILY.   Coenzyme Q10 300 MG CAPS Take 300 mg by mouth daily.    ezetimibe (ZETIA) 10 MG tablet Take 1 tablet (10 mg total) by mouth daily.   latanoprost (XALATAN) 0.005 % ophthalmic solution 1 drop at bedtime.   Multiple Vitamin (MULTIVITAMIN WITH MINERALS) TABS tablet Take 1 tablet by mouth daily.   polyvinyl alcohol (LIQUIFILM TEARS) 1.4 % ophthalmic solution Place 1 drop into both eyes daily as needed for dry eyes (allergies).   predniSONE (DELTASONE) 20 MG tablet 3 tabs poqday 1-2, 2 tabs poqday 3-4, 1 tab poqday 5-6   Facility-Administered Encounter Medications as of 11/28/2020  Medication   0.9 %  sodium chloride infusion   Reviewed chart prior to disease state call. Spoke with patient regarding BP  Recent Office Vitals: BP Readings from Last 3 Encounters:  11/22/20 130/76  05/24/20 130/80  03/25/20 138/64   Pulse Readings from Last 3 Encounters:  11/22/20 80  05/24/20 74  03/25/20 62    Wt Readings from Last 3 Encounters:  11/22/20 188 lb (85.3 kg)   05/24/20 183 lb 6.4 oz (83.2 kg)  03/25/20 182 lb (82.6 kg)     Kidney Function Lab Results  Component Value Date/Time   CREATININE 0.86 03/25/2020 09:59 AM   CREATININE 0.89 04/14/2019 09:54 AM   GFRNONAA 90 02/14/2018 09:21 AM   GFRAA 105 02/14/2018 09:21 AM    BMP Latest Ref Rng & Units 03/25/2020 04/14/2019 03/30/2019  Glucose 65 - 99 mg/dL 90 89 91  BUN 7 - 25 mg/dL '13 16 15  '$ Creatinine 0.70 - 1.25 mg/dL 0.86 0.89 0.92  BUN/Creat Ratio 6 - 22 (calc) NOT APPLICABLE NOT APPLICABLE NOT APPLICABLE  Sodium A999333 - 146 mmol/L 139 141 140  Potassium 3.5 - 5.3 mmol/L 5.1 4.9 5.7(H)  Chloride 98 - 110 mmol/L 103 106 104  CO2 20 - 32 mmol/L '31 27 27  '$ Calcium 8.6 - 10.3 mg/dL 9.9 9.8 10.4(H)    Current antihypertensive regimen:  Amlodipine 5 mg 1 tablet by mouth daily.  How often are you checking your Blood Pressure? Patient stated daily  Current home BP readings: Patient stated his blood pressure ranges around 130/75-81.  What recent interventions/DTPs have been made by any provider to improve Blood Pressure control since last CPP Visit: None.   Any recent hospitalizations or ED visits since last visit with CPP? Patient stated no.  What diet changes have been made to improve Blood Pressure Control?  Patient stated he doesn't eat any  carbonated beverages, no processed foods, no sugar. He stated he drinks a lot of water.   What exercise is being done to improve your Blood Pressure Control?  Patient stated he is gradually doing more and more since his back pain is slowly improving.   Adherence Review: Is the patient currently on ACE/ARB medication? N/A  Does the patient have >5 day gap between last estimated fill dates?   Star Rating Drugs: Atorvastatin 80 mg 90 DS 09/20/20.   Follow-Up:Pharmacist Review Charlann Lange, RMA Clinical Pharmacist Assistant [phone]  10 minutes spent in review, coordination, and documentation.  Reviewed by: Beverly Milch,  PharmD Clinical Pharmacist [phone]

## 2020-12-01 ENCOUNTER — Telehealth: Payer: Self-pay | Admitting: *Deleted

## 2020-12-01 NOTE — Telephone Encounter (Signed)
Received call from patient.   Reports that he has completed Prednisone taper. States that his lower back pain has improved about 60% and hip pain has improved 50%.   Inquired as to next steps.   Please advise.

## 2020-12-02 MED ORDER — PREDNISONE 20 MG PO TABS: ORAL_TABLET | ORAL | 0 refills | Status: DC

## 2020-12-02 NOTE — Telephone Encounter (Signed)
Patient returned call and made aware.   Prescription sent to pharmacy.  

## 2020-12-02 NOTE — Telephone Encounter (Signed)
Call placed to patient. LMTRC.  

## 2020-12-02 NOTE — Addendum Note (Signed)
Addended by: Sheral Flow on: 12/02/2020 01:01 PM   Modules accepted: Orders

## 2020-12-19 ENCOUNTER — Other Ambulatory Visit: Payer: Self-pay | Admitting: *Deleted

## 2020-12-19 MED ORDER — CLOPIDOGREL BISULFATE 75 MG PO TABS: 75.0000 mg | ORAL_TABLET | Freq: Every day | ORAL | 0 refills | Status: DC

## 2020-12-23 ENCOUNTER — Other Ambulatory Visit: Payer: Self-pay | Admitting: *Deleted

## 2020-12-23 MED ORDER — AMLODIPINE BESYLATE 5 MG PO TABS: 5.0000 mg | ORAL_TABLET | Freq: Every day | ORAL | 2 refills | Status: DC

## 2021-01-04 ENCOUNTER — Other Ambulatory Visit: Payer: Self-pay | Admitting: *Deleted

## 2021-01-04 MED ORDER — ATORVASTATIN CALCIUM 80 MG PO TABS: 80.0000 mg | ORAL_TABLET | Freq: Every day | ORAL | 2 refills | Status: DC

## 2021-01-16 ENCOUNTER — Encounter: Payer: Self-pay | Admitting: Family Medicine

## 2021-01-26 ENCOUNTER — Telehealth: Payer: Self-pay | Admitting: Pharmacist

## 2021-01-26 NOTE — Progress Notes (Signed)
Chronic Care Management Pharmacy Assistant   Name: ACELIN FERDIG  MRN: [id number] DOB: 02-03-1951  Reason for Encounter: Disease State For HTN.   Conditions to be addressed/monitored: HTN, PVD, HLD, Tobacco Use  Recent office visits:  None since 11/28/20  Recent consult visits:  None since 11/28/20  Hospital visits:  None since 11/28/20  Medications: Outpatient Encounter Medications as of 01/26/2021  Medication Sig   amLODipine (NORVASC) 5 MG tablet Take 1 tablet (5 mg total) by mouth daily.   atorvastatin (LIPITOR) 80 MG tablet Take 1 tablet (80 mg total) by mouth daily.   cetirizine (ZYRTEC) 10 MG tablet Take 10 mg by mouth daily as needed.    Cholecalciferol (VITAMIN D) 125 MCG (5000 UT) CAPS Take 5,000 Units by mouth daily.    clopidogrel (PLAVIX) 75 MG tablet Take 1 tablet (75 mg total) by mouth daily.   Coenzyme Q10 300 MG CAPS Take 300 mg by mouth daily.    ezetimibe (ZETIA) 10 MG tablet Take 1 tablet (10 mg total) by mouth daily.   latanoprost (XALATAN) 0.005 % ophthalmic solution 1 drop at bedtime.   Multiple Vitamin (MULTIVITAMIN WITH MINERALS) TABS tablet Take 1 tablet by mouth daily.   polyvinyl alcohol (LIQUIFILM TEARS) 1.4 % ophthalmic solution Place 1 drop into both eyes daily as needed for dry eyes (allergies).   predniSONE (DELTASONE) 20 MG tablet 3 tabs poqday 1-2, 2 tabs poqday 3-4, 1 tab poqday 5-6   Facility-Administered Encounter Medications as of 01/26/2021  Medication   0.9 %  sodium chloride infusion   Reviewed chart prior to disease state call. Spoke with patient regarding BP  Recent Office Vitals: BP Readings from Last 3 Encounters:  11/22/20 130/76  05/24/20 130/80  03/25/20 138/64   Pulse Readings from Last 3 Encounters:  11/22/20 80  05/24/20 74  03/25/20 62    Wt Readings from Last 3 Encounters:  11/22/20 188 lb (85.3 kg)  05/24/20 183 lb 6.4 oz (83.2 kg)  03/25/20 182 lb (82.6 kg)     Kidney Function Lab Results  Component Value  Date/Time   CREATININE 0.86 03/25/2020 09:59 AM   CREATININE 0.89 04/14/2019 09:54 AM   GFRNONAA 90 02/14/2018 09:21 AM   GFRAA 105 02/14/2018 09:21 AM    BMP Latest Ref Rng & Units 03/25/2020 04/14/2019 03/30/2019  Glucose 65 - 99 mg/dL 90 89 91  BUN 7 - 25 mg/dL 13 16 15   Creatinine 0.70 - 1.25 mg/dL 0.86 0.89 0.92  BUN/Creat Ratio 6 - 22 (calc) NOT APPLICABLE NOT APPLICABLE NOT APPLICABLE  Sodium 993 - 146 mmol/L 139 141 140  Potassium 3.5 - 5.3 mmol/L 5.1 4.9 5.7(H)  Chloride 98 - 110 mmol/L 103 106 104  CO2 20 - 32 mmol/L 31 27 27   Calcium 8.6 - 10.3 mg/dL 9.9 9.8 10.4(H)    Current antihypertensive regimen:  Amlodipine 5 mg 1 tablet by mouth daily.  How often are you checking your Blood Pressure? Patient stated  several times per month  Current home BP readings: Patient stated his blood pressure readings are within normal range.   What recent interventions/DTPs have been made by any provider to improve Blood Pressure control since last CPP Visit: None.  Any recent hospitalizations or ED visits since last visit with CPP? Patient stated no.   What diet changes have been made to improve Blood Pressure Control?  Patient stated not much has changed in his diet.   What exercise is being done to improve  your Blood Pressure Control?  Patient stated he his back is feeling a lot better and he is able to do a lot more.  Adherence Review: Is the patient currently on ACE/ARB medication? N/A  Does the patient have >5 day gap between last estimated fill dates? N/A.  Care Gaps:Patient is due for his AWV, Scheduled for 02/23/21 at 945 am.   Star Rating Drugs:Atorvastatin 80 mg 90 DS  Follow-Up:Pharmacist Review  Charlann Lange, RMA Clinical Pharmacist Assistant [phone]

## 2021-02-03 ENCOUNTER — Other Ambulatory Visit (HOSPITAL_BASED_OUTPATIENT_CLINIC_OR_DEPARTMENT_OTHER): Payer: Self-pay | Admitting: Cardiovascular Disease

## 2021-02-03 DIAGNOSIS — I6523 Occlusion and stenosis of bilateral carotid arteries: Secondary | ICD-10-CM

## 2021-02-06 ENCOUNTER — Other Ambulatory Visit (HOSPITAL_BASED_OUTPATIENT_CLINIC_OR_DEPARTMENT_OTHER): Payer: Self-pay | Admitting: Cardiovascular Disease

## 2021-02-06 DIAGNOSIS — I739 Peripheral vascular disease, unspecified: Secondary | ICD-10-CM

## 2021-02-06 DIAGNOSIS — I6523 Occlusion and stenosis of bilateral carotid arteries: Secondary | ICD-10-CM

## 2021-02-23 ENCOUNTER — Other Ambulatory Visit: Payer: Self-pay

## 2021-02-23 ENCOUNTER — Telehealth: Payer: Self-pay

## 2021-02-23 ENCOUNTER — Ambulatory Visit (INDEPENDENT_AMBULATORY_CARE_PROVIDER_SITE_OTHER): Payer: Medicare Other

## 2021-02-23 ENCOUNTER — Other Ambulatory Visit: Payer: Self-pay | Admitting: Family Medicine

## 2021-02-23 VITALS — Ht 71.0 in | Wt 188.0 lb

## 2021-02-23 DIAGNOSIS — Z0000 Encounter for general adult medical examination without abnormal findings: Secondary | ICD-10-CM | POA: Diagnosis not present

## 2021-02-23 MED ORDER — PREDNISONE 20 MG PO TABS: ORAL_TABLET | ORAL | 0 refills | Status: DC

## 2021-02-23 NOTE — Patient Instructions (Signed)
Jesus Klein , Thank you for taking time to come for your Medicare Wellness Visit. I appreciate your ongoing commitment to your health goals. Please review the following plan we discussed and let me know if I can assist you in the future.   Screening recommendations/referrals: Colonoscopy: Done 02/01/2020 Repeat due 02/01/2023 Recommended yearly ophthalmology/optometry visit for glaucoma screening and checkup Recommended yearly dental visit for hygiene and checkup  Vaccinations: Influenza vaccine: Done 01/16/2021 Repeat annually  Pneumococcal vaccine: Done 04/23/2016 and 04/23/2017 Tdap vaccine: Done 02/19/2011 Repeat in 10 years  Shingles vaccine: Shingrix discussed. Please contact your pharmacy for coverage information.     Covid-19: Done 06/16/2019, 07/07/2019, 03/01/2020 and 01/16/2021.  Advanced directives: Please bring a copy of your health care power of attorney and living will to the office to be added to your chart at your convenience.   Conditions/risks identified: Aim for 30 minutes of exercise or brisk walking each day, drink 6-8 glasses of water and eat lots of fruits and vegetables. KEEP UP THE GOOD WORK!!!  Next appointment: Follow up in one year for your annual wellness visit. 2023.  Preventive Care 69 Years and Older, Male  Preventive care refers to lifestyle choices and visits with your health care provider that can promote health and wellness. What does preventive care include? A yearly physical exam. This is also called an annual well check. Dental exams once or twice a year. Routine eye exams. Ask your health care provider how often you should have your eyes checked. Personal lifestyle choices, including: Daily care of your teeth and gums. Regular physical activity. Eating a healthy diet. Avoiding tobacco and drug use. Limiting alcohol use. Practicing safe sex. Taking low doses of aspirin every day. Taking vitamin and mineral supplements as recommended by your health  care provider. What happens during an annual well check? The services and screenings done by your health care provider during your annual well check will depend on your age, overall health, lifestyle risk factors, and family history of disease. Counseling  Your health care provider may ask you questions about your: Alcohol use. Tobacco use. Drug use. Emotional well-being. Home and relationship well-being. Sexual activity. Eating habits. History of falls. Memory and ability to understand (cognition). Work and work Statistician. Screening  You may have the following tests or measurements: Height, weight, and BMI. Blood pressure. Lipid and cholesterol levels. These may be checked every 5 years, or more frequently if you are over 28 years old. Skin check. Lung cancer screening. You may have this screening every year starting at age 69 if you have a 30-pack-year history of smoking and currently smoke or have quit within the past 15 years. Fecal occult blood test (FOBT) of the stool. You may have this test every year starting at age 69. Flexible sigmoidoscopy or colonoscopy. You may have a sigmoidoscopy every 5 years or a colonoscopy every 10 years starting at age 69. Prostate cancer screening. Recommendations will vary depending on your family history and other risks. Hepatitis C blood test. Hepatitis B blood test. Sexually transmitted disease (STD) testing. Diabetes screening. This is done by checking your blood sugar (glucose) after you have not eaten for a while (fasting). You may have this done every 1-3 years. Abdominal aortic aneurysm (AAA) screening. You may need this if you are a current or former smoker. Osteoporosis. You may be screened starting at age 69 if you are at high risk. Talk with your health care provider about your test results, treatment options, and if  necessary, the need for more tests. Vaccines  Your health care provider may recommend certain vaccines, such  as: Influenza vaccine. This is recommended every year. Tetanus, diphtheria, and acellular pertussis (Tdap, Td) vaccine. You may need a Td booster every 10 years. Zoster vaccine. You may need this after age 69 Pneumococcal 13-valent conjugate (PCV13) vaccine. One dose is recommended after age 69 Pneumococcal polysaccharide (PPSV23) vaccine. One dose is recommended after age 69 Talk to your health care provider about which screenings and vaccines you need and how often you need them. This information is not intended to replace advice given to you by your health care provider. Make sure you discuss any questions you have with your health care provider. Document Released: 05/06/2015 Document Revised: 12/28/2015 Document Reviewed: 02/08/2015 Elsevier Interactive Patient Education  2017 [place] Prevention in the Home Falls can cause injuries. They can happen to people of all ages. There are many things you can do to make your home safe and to help prevent falls. What can I do on the outside of my home? Regularly fix the edges of walkways and driveways and fix any cracks. Remove anything that might make you trip as you walk through a door, such as a raised step or threshold. Trim any bushes or trees on the path to your home. Use bright outdoor lighting. Clear any walking paths of anything that might make someone trip, such as rocks or tools. Regularly check to see if handrails are loose or broken. Make sure that both sides of any steps have handrails. Any raised decks and porches should have guardrails on the edges. Have any leaves, snow, or ice cleared regularly. Use sand or salt on walking paths during winter. Clean up any spills in your garage right away. This includes oil or grease spills. What can I do in the bathroom? Use night lights. Install grab bars by the toilet and in the tub and shower. Do not use towel bars as grab bars. Use non-skid mats or decals in the tub or  shower. If you need to sit down in the shower, use a plastic, non-slip stool. Keep the floor dry. Clean up any water that spills on the floor as soon as it happens. Remove soap buildup in the tub or shower regularly. Attach bath mats securely with double-sided non-slip rug tape. Do not have throw rugs and other things on the floor that can make you trip. What can I do in the bedroom? Use night lights. Make sure that you have a light by your bed that is easy to reach. Do not use any sheets or blankets that are too big for your bed. They should not hang down onto the floor. Have a firm chair that has side arms. You can use this for support while you get dressed. Do not have throw rugs and other things on the floor that can make you trip. What can I do in the kitchen? Clean up any spills right away. Avoid walking on wet floors. Keep items that you use a lot in easy-to-reach places. If you need to reach something above you, use a strong step stool that has a grab bar. Keep electrical cords out of the way. Do not use floor polish or wax that makes floors slippery. If you must use wax, use non-skid floor wax. Do not have throw rugs and other things on the floor that can make you trip. What can I do with my stairs? Do not leave any items  on the stairs. Make sure that there are handrails on both sides of the stairs and use them. Fix handrails that are broken or loose. Make sure that handrails are as long as the stairways. Check any carpeting to make sure that it is firmly attached to the stairs. Fix any carpet that is loose or worn. Avoid having throw rugs at the top or bottom of the stairs. If you do have throw rugs, attach them to the floor with carpet tape. Make sure that you have a light switch at the top of the stairs and the bottom of the stairs. If you do not have them, ask someone to add them for you. What else can I do to help prevent falls? Wear shoes that: Do not have high heels. Have  rubber bottoms. Are comfortable and fit you well. Are closed at the toe. Do not wear sandals. If you use a stepladder: Make sure that it is fully opened. Do not climb a closed stepladder. Make sure that both sides of the stepladder are locked into place. Ask someone to hold it for you, if possible. Clearly mark and make sure that you can see: Any grab bars or handrails. First and last steps. Where the edge of each step is. Use tools that help you move around (mobility aids) if they are needed. These include: Canes. Walkers. Scooters. Crutches. Turn on the lights when you go into a dark area. Replace any light bulbs as soon as they burn out. Set up your furniture so you have a clear path. Avoid moving your furniture around. If any of your floors are uneven, fix them. If there are any pets around you, be aware of where they are. Review your medicines with your doctor. Some medicines can make you feel dizzy. This can increase your chance of falling. Ask your doctor what other things that you can do to help prevent falls. This information is not intended to replace advice given to you by your health care provider. Make sure you discuss any questions you have with your health care provider. Document Released: 02/03/2009 Document Revised: 09/15/2015 Document Reviewed: 05/14/2014 Elsevier Interactive Patient Education  2017 [employer].

## 2021-02-23 NOTE — Progress Notes (Signed)
Subjective:   Jesus Klein is a 70 y.o. male who presents for Medicare Annual/Subsequent preventive examination. Virtual Visit via Telephone Note  I connected with  Jesus Klein on 02/23/21 at  9:45 AM EDT by telephone and verified that I am speaking with the correct person using two identifiers.  Location: Patient: Home Provider: [facility] Persons participating in the virtual visit: patient/Nurse Health Advisor   I discussed the limitations, risks, security and privacy concerns of performing an evaluation and management service by telephone and the availability of in person appointments. The patient expressed understanding and agreed to proceed.  Interactive audio and video telecommunications were attempted between this nurse and patient, however failed, due to patient having technical difficulties OR patient did not have access to video capability.  We continued and completed visit with audio only.  Some vital signs may be absent or patient reported.   Chriss Driver, LPN  Review of Systems     Cardiac Risk Factors include: advanced age (>65men, >14 women);hypertension;dyslipidemia;male gender     Objective:    Today's Vitals   02/23/21 0945 02/23/21 0947  Weight: 188 lb (85.3 kg)   Height: 5\' 11"  (1.803 m)   PainSc:  5    Body mass index is 26.22 kg/m.  Advanced Directives 02/23/2021 03/25/2020 02/14/2018 06/29/2016 04/28/2014 04/28/2014 03/04/2014  Does Patient Have a Medical Advance Directive? Yes Yes No;Yes No No No No  Type of Advance Directive Living will [place];Living will [place];Living will - - - -  Does patient want to make changes to medical advance directive? - No - Patient declined No - Patient declined - - - -  Copy of [place] in Chart? - Yes - validated most recent copy scanned in chart (See row information) No - copy requested - - - -  Would patient like information on creating a medical advance  directive? No - Patient declined - No - Patient declined - No - patient declined information No - patient declined information No - patient declined information    Current Medications (verified) Outpatient Encounter Medications as of 02/23/2021  Medication Sig   amLODipine (NORVASC) 5 MG tablet Take 1 tablet (5 mg total) by mouth daily.   atorvastatin (LIPITOR) 80 MG tablet Take 1 tablet (80 mg total) by mouth daily.   cetirizine (ZYRTEC) 10 MG tablet Take 10 mg by mouth daily as needed.    Cholecalciferol (VITAMIN D) 125 MCG (5000 UT) CAPS Take 5,000 Units by mouth daily.    clopidogrel (PLAVIX) 75 MG tablet Take 1 tablet (75 mg total) by mouth daily.   Coenzyme Q10 300 MG CAPS Take 300 mg by mouth daily.    ezetimibe (ZETIA) 10 MG tablet Take 1 tablet (10 mg total) by mouth daily.   latanoprost (XALATAN) 0.005 % ophthalmic solution 1 drop at bedtime.   Multiple Vitamin (MULTIVITAMIN WITH MINERALS) TABS tablet Take 1 tablet by mouth daily.   polyvinyl alcohol (LIQUIFILM TEARS) 1.4 % ophthalmic solution Place 1 drop into both eyes daily as needed for dry eyes (allergies).   predniSONE (DELTASONE) 20 MG tablet 3 tabs poqday 1-2, 2 tabs poqday 3-4, 1 tab poqday 5-6 (Patient not taking: Reported on 02/23/2021)   Facility-Administered Encounter Medications as of 02/23/2021  Medication   0.9 %  sodium chloride infusion    Allergies (verified) Patient has no known allergies.   History: Past Medical History:  Diagnosis Date   Allergy  seasonal allergies   Arthritis    arms/shoulders   Blood transfusion without reported diagnosis 2009   with hip replacement   History of tobacco abuse    Hyperlipidemia    on meds   Hypertension 2010   on meds   Left-sided carotid artery disease ([place])    high-grade ostial left common carotid artery stenosis   Peripheral arterial disease ([place])    a. 02/2014 - right common iliac and external iliac intervention. b. H/o PTA/stenting of left common carotid  artery stenosis. Known 80% left subclavian artery stenosis without UE claudication/steal sx.   Subclavian artery stenosis, left [facility])    Past Surgical History:  Procedure Laterality Date   CAROTID STENT INSERTION N/A 04/28/2014   Procedure: CAROTID STENT INSERTION;  Surgeon: Serafina Mitchell, MD;  Location[facility] C[facility] LAB;  Service: Cardiovascular;  Laterality: N/A;   ILIAC ARTERY STENT Right 03/04/2014   w/as well as right external iliac and common femoral artery stenting/notes 03/04/2014   JOINT REPLACEMENT     LOWER EXTREMITY ANGIOGRAM N/A 03/04/2014   Procedure: LOWER EXTREMITY ANGIOGRAM;  Surgeon: Lorretta Harp, MD;  Location: [facility] CATH LAB;  Service: Cardiovascular;  Laterality: N/A;   TOTAL HIP ARTHROPLASTY Right 10/2005   WISDOM TOOTH EXTRACTION     Family History  Problem Relation Age of Onset   Hyperlipidemia Mother    Colitis Mother    Diabetes Mother    Lung cancer Mother 7   Liver cancer Mother 69       mets from lung   Brain cancer Mother 33       mets from lung   Kidney cancer Mother 33       mets from 34   Diverticulitis Mother    Arthritis Father    COPD Father    Diabetes Father    Hypertension Father    Colon cancer Neg Hx    Stomach cancer Neg Hx    Colon polyps Neg Hx    Esophageal cancer Neg Hx    Rectal cancer Neg Hx    Social History   Socioeconomic History   Marital status: Married    Spouse name: Santiago Glad   Number of children: 2   Years of education: Not on file   Highest education level: Not on file  Occupational History   Not on file  Tobacco Use   Smoking status: Former    Packs/day: 1.50    Years: 30.00    Pack years: 45.00    Types: Cigarettes    Quit date: 01/19/2009    Years since quitting: 12.1   Smokeless tobacco: Never  Vaping Use   Vaping Use: Never used  Substance and Sexual Activity   Alcohol use: Yes    Alcohol/week: 2.0 standard drinks    Types: 2 Cans of beer per week   Drug use: No   Sexual activity: Yes    Birth  control/protection: None  Other Topics Concern   Not on file  Social History Narrative   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 He may occasionally lift 20 pounds but most is done by machine.    Mix of standing, sitting, moving around.      Used to walk a lot and do a lot outside. Got a treadmill to use during cold weather.   Married.   2 daughters- grown.         Social Determinants of Health  Financial Resource Strain: Low Risk    Difficulty of Paying Living Expenses: Not hard at all  Food Insecurity: No Food Insecurity   Worried About Charity fundraiser in the Last Year: Never true   Ran Out of Food in the Last Year: Never true  Transportation Needs: No Transportation Needs   Lack of Transportation (Medical): No   Lack of Transportation (Non-Medical): No  Physical Activity: Sufficiently Active   Days of Exercise per Week: 5 days   Minutes of Exercise per Session: 60 min  Stress: No Stress Concern Present   Feeling of Stress : Not at all  Social Connections: Socially Integrated   Frequency of Communication with Friends and Family: More than three times a week   Frequency of Social Gatherings with Friends and Family: More than three times a week   Attends Religious Services: More than 4 times per year   Active Member of [employer] or Organizations: Yes   Attends Music therapist: More than 4 times per year   Marital Status: Married    Tobacco Counseling Counseling given: Not Answered   Clinical Intake:  Pre-visit preparation completed: Yes  Pain : 0-10 Pain Score: 5  Pain Type: Chronic pain Pain Location: Back Pain Descriptors / Indicators: Aching Pain Onset: More than a month ago Pain Frequency: Intermittent     BMI - recorded: 26.22 Nutritional Status: BMI 25 -29 Overweight Nutritional Risks: None Diabetes: No  How often do you need to have someone help you when you read instructions,  pamphlets, or other written materials from your doctor or pharmacy?: 1 - Never  Diabetic?no  Interpreter Needed?: No  Information entered by :: MJ Azlynn Mitnick, LPN   Activities of Daily Living In your present state of health, do you have any difficulty performing the following activities: 02/23/2021 03/25/2020  Hearing? N N  Vision? N N  Difficulty concentrating or making decisions? N N  Walking or climbing stairs? N N  Dressing or bathing? N N  Doing errands, shopping? N N  Preparing Food and eating ? N N  Using the Toilet? N N  In the past six months, have you accidently leaked urine? N N  Do you have problems with loss of bowel control? N N  Managing your Medications? N N  Managing your Finances? N N  Housekeeping or managing your Housekeeping? N N  Some recent data might be hidden    Patient Care Team: Susy Frizzle, MD as PCP - General (Family Medicine) Edythe Clarity, [facility] as Pharmacist (Pharmacist)  Indicate any recent Medical Services you may have received from other than [facility] providers in the past year (date may be approximate).     Assessment:   This is a routine wellness examination for [place].  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  Vision Screening - Comments:: Glasses. [facility] in [place], 2022.  Dietary issues and exercise activities discussed: Current Exercise Habits: Home exercise routine, Type of exercise: strength training/weights;walking, Time (Minutes): 60, Frequency (Times/Week): 5, Weekly Exercise (Minutes/Week): 300, Intensity: Mild, Exercise limited by: cardiac condition(s)   Goals Addressed             This Visit's Progress    Exercise 3x per week (30 min per time)       Would like to stay healthy and be able to travel to [place] next year.      Track and Manage My Blood Pressure-Hypertension  On track    Timeframe:  Long-Range Goal Priority:  High Start Date:    09/01/20                          Expected End Date:    03/04/21                   Follow Up Date 12/02/20    - check blood pressure weekly - choose a place to take my blood pressure (home, clinic or office, retail store) - write blood pressure results in a log or diary    Why is this important?   You won't feel high blood pressure, but it can still hurt your blood vessels.  High blood pressure can cause heart or kidney problems. It can also cause a stroke.  Making lifestyle changes like losing a little weight or eating less salt will help.  Checking your blood pressure at home and at different times of the day can help to control blood pressure.  If the doctor prescribes medicine remember to take it the way the doctor ordered.  Call the office if you cannot afford the medicine or if there are questions about it.     Notes:        Depression Screen PHQ 2/9 Scores 02/23/2021 03/25/2020 03/30/2019 02/14/2018 11/29/2016 02/02/2014  PHQ - 2 Score 0 0 0 0 0 0  PHQ- 9 Score - - - - 0 -    Fall Risk Fall Risk  02/23/2021 03/25/2020 03/30/2019 02/14/2018 11/29/2016  Falls in the past year? 1 0 0 No No  Number falls in past yr: 0 - - - -  Injury with Fall? 1 - - - -  Risk for fall due to : Impaired vision;History of fall(s) No Fall Risks - - -  Follow up Falls prevention discussed Falls evaluation completed Falls evaluation completed - -    FALL RISK PREVENTION PERTAINING TO THE HOME:  Any stairs in or around the home? Yes  If so, are there any without handrails? No  Home free of loose throw rugs in walkways, pet beds, electrical cords, etc? Yes  Adequate lighting in your home to reduce risk of falls? Yes   ASSISTIVE DEVICES UTILIZED TO PREVENT FALLS:  Life alert? No  Use of a cane, walker or w/c? No  Grab bars in the bathroom? Yes  Shower chair or bench in shower? Yes  Elevated toilet seat or a handicapped toilet? No   TIMED UP AND GO:  Was the test performed? No .   Cognitive  Function:     6CIT Screen 02/23/2021  What Year? 0 points  What month? 0 points  What time? 0 points  Count back from 20 0 points  Months in reverse 0 points  Repeat phrase 0 points  Total Score 0    Immunizations Immunization History  Administered Date(s) Administered   Fluad Quad(high Dose 65+) 12/23/2018, 02/05/2020   Influenza,inj,Quad PF,6+ Mos 02/02/2014   Influenza-Unspecified 03/03/2018, 01/16/2021   PFIZER(Purple Top)SARS-COV-2 Vaccination 06/16/2019, 07/07/2019, 03/01/2020, 01/16/2021   Pneumococcal Conjugate-13 04/23/2016   Pneumococcal Polysaccharide-23 04/23/2017   Tdap 09/02/2010   Zoster, Live 11/07/2015    TDAP status: Due, Education has been provided regarding the importance of this vaccine. Advised may receive this vaccine at local pharmacy or Health Dept. Aware to provide a copy of the vaccination record if obtained from local pharmacy or Health Dept. Verbalized acceptance and understanding.  Flu Vaccine  status: Up to date  Pneumococcal vaccine status: Up to date  Covid-19 vaccine status: Completed vaccines  Qualifies for Shingles Vaccine? Yes   Zostavax completed Yes   Shingrix Completed?: No.    Education has been provided regarding the importance of this vaccine. Patient has been advised to call insurance company to determine out of pocket expense if they have not yet received this vaccine. Advised may also receive vaccine at local pharmacy or Health Dept. Verbalized acceptance and understanding.  Screening Tests Health Maintenance  Topic Date Due   Zoster Vaccines- Shingrix (1 of 2) Never done   TETANUS/TDAP  09/04/2020   COVID-19 Vaccine (5 - Booster for Pfizer series) 03/13/2021   COLONOSCOPY (Pts 45-26yrs Insurance coverage will need to be confirmed)  02/01/2023   Pneumonia Vaccine 48+ Years old  Completed   INFLUENZA VACCINE  Completed   Hepatitis C Screening  Completed   HPV VACCINES  Aged Out    Health Maintenance  Health Maintenance  Due  Topic Date Due   Zoster Vaccines- Shingrix (1 of 2) Never done   TETANUS/TDAP  09/04/2020    Colorectal cancer screening: Type of screening: Colonoscopy. Completed 02/01/2020. Repeat every 3 years  Lung Cancer Screening: (Low Dose CT Chest recommended if Age 85-80 years, 30 pack-year currently smoking OR have quit w/in 15years.) does not qualify. QUIT X 23 YEARS.  Additional Screening:  Hepatitis C Screening: does qualify; Completed 11/29/2016  Vision Screening: Recommended annual ophthalmology exams for early detection of glaucoma and other disorders of the eye. Is the patient up to date with their annual eye exam?  Yes  Who is the provider or what is the name of the office in which the patient attends annual eye exams? [facility] If pt is not established with a provider, would they like to be referred to a provider to establish care? No .   Dental Screening: Recommended annual dental exams for proper oral hygiene  Community Resource Referral / Chronic Care Management: CRR required this visit?  No   CCM required this visit?  No      Plan:     I have personally reviewed and noted the following in the patient's chart:   Medical and social history Use of alcohol, tobacco or illicit drugs  Current medications and supplements including opioid prescriptions. Patient is not currently taking opioid prescriptions. Functional ability and status Nutritional status Physical activity Advanced directives List of other physicians Hospitalizations, surgeries, and ER visits in previous 12 months Vitals Screenings to include cognitive, depression, and falls Referrals and appointments  In addition, I have reviewed and discussed with patient certain preventive protocols, quality metrics, and best practice recommendations. A written personalized care plan for preventive services as well as general preventive health recommendations were provided to patient.     Chriss Driver, LPN   [id number]   Nurse Notes:  Up to date on all health maintenance. Discussed Shingles vaccine. Colonoscopy due 2024.

## 2021-02-23 NOTE — Telephone Encounter (Signed)
Called for AWV. Pt c/o lower back pain. Pt states he saw you over the summer for similar pain. Pt states Rx for prednisone helped and asks if he can have another prescription? Thank you!

## 2021-03-02 NOTE — Progress Notes (Signed)
Chronic Care Management Pharmacy Note  03/10/2021 Name:  Jesus Klein MRN:  [id number] DOB:  02-Feb-1951  Subjective: Jesus Klein is an 70 y.o. year old male who is a primary patient of Pickard, Cammie Mcgee, MD.  The CCM team was consulted for assistance with disease management and care coordination needs.    Engaged with patient face to face for follow up visit in response to provider referral for pharmacy case management and/or care coordination services.   Consent to Services:  The patient was given the following information about Chronic Care Management services today, agreed to services, and gave verbal consent: 1. CCM service includes personalized support from designated clinical staff supervised by the primary care provider, including individualized plan of care and coordination with other care providers 2. 24/7 contact phone numbers for assistance for urgent and routine care needs. 3. Service will only be billed when office clinical staff spend 20 minutes or more in a month to coordinate care. 4. Only one practitioner may furnish and bill the service in a calendar month. 5.The patient may stop CCM services at any time (effective at the end of the month) by phone call to the office staff. 6. The patient will be responsible for cost sharing (co-pay) of up to 20% of the service fee (after annual deductible is met). Patient agreed to services and consent obtained.  Patient Care Team: Susy Frizzle, MD as PCP - General (Family Medicine) Edythe Clarity, [facility] as Pharmacist (Pharmacist)  Recent office visits:  None since 11/28/20   Recent consult visits:  None since 11/28/20   Hospital visits:  None since 11/28/20  Objective:  Lab Results  Component Value Date   CREATININE 0.86 03/25/2020   BUN 13 03/25/2020   GFRNONAA 90 02/14/2018   GFRAA 105 02/14/2018   NA 139 03/25/2020   K 5.1 03/25/2020   CALCIUM 9.9 03/25/2020   CO2 31 03/25/2020   GLUCOSE 90 03/25/2020    No results  found for: HGBA1C, FRUCTOSAMINE, GFR, MICROALBUR  Last diabetic Eye exam: No results found for: HMDIABEYEEXA  Last diabetic Foot exam: No results found for: HMDIABFOOTEX   Lab Results  Component Value Date   CHOL 129 07/28/2020   HDL 50 07/28/2020   LDLCALC 65 07/28/2020   TRIG 68 07/28/2020   CHOLHDL 2.6 07/28/2020    Hepatic Function Latest Ref Rng & Units 07/28/2020 03/25/2020 03/30/2019  Total Protein 6.0 - 8.5 g/dL 7.2 7.1 7.4  Albumin 3.8 - 4.8 g/dL 4.8 - -  AST 0 - 40 IU/L 29 24 22   ALT 0 - 44 IU/L 29 25 21   Alk Phosphatase 44 - 121 IU/L 83 - -  Total Bilirubin 0.0 - 1.2 mg/dL 0.4 0.6 0.6  Bilirubin, Direct 0.00 - 0.40 mg/dL 0.12 - -    Lab Results  Component Value Date/Time   TSH 1.35 11/29/2016 10:47 AM   TSH 1.24 11/07/2015 09:10 AM    CBC Latest Ref Rng & Units 03/25/2020 03/30/2019 02/14/2018  WBC 3.8 - 10.8 Thousand/uL 4.1 5.0 6.4  Hemoglobin 13.2 - 17.1 g/dL 16.5 15.7 15.7  Hematocrit 38.5 - 50.0 % 50.3(H) 47.7 48.2  Platelets 140 - 400 Thousand/uL 290 310 287    Lab Results  Component Value Date/Time   VD25OH 54 03/25/2020 09:59 AM   VD25OH 36 11/07/2015 09:10 AM    Clinical ASCVD: Yes  The ASCVD Risk score (Arnett DK, et al., 2019) failed to calculate for the following reasons:  The valid total cholesterol range is 130 to 320 mg/dL    Depression screen [facility] 2/9 02/23/2021 03/25/2020 03/30/2019  Decreased Interest 0 0 0  Down, Depressed, Hopeless 0 0 0  PHQ - 2 Score 0 0 0  Altered sleeping - - -  Tired, decreased energy - - -  Change in appetite - - -  Feeling bad or failure about yourself  - - -  Trouble concentrating - - -  Moving slowly or fidgety/restless - - -  Suicidal thoughts - - -  PHQ-9 Score - - -  Difficult doing work/chores - - -     Social History   Tobacco Use  Smoking Status Former   Packs/day: 1.50   Years: 30.00   Pack years: 45.00   Types: Cigarettes   Quit date: 01/19/2009   Years since quitting: 12.1  Smokeless Tobacco  Never   BP Readings from Last 3 Encounters:  11/22/20 130/76  05/24/20 130/80  03/25/20 138/64   Pulse Readings from Last 3 Encounters:  11/22/20 80  05/24/20 74  03/25/20 62   Wt Readings from Last 3 Encounters:  02/23/21 188 lb (85.3 kg)  11/22/20 188 lb (85.3 kg)  05/24/20 183 lb 6.4 oz (83.2 kg)   BMI Readings from Last 3 Encounters:  02/23/21 26.22 kg/m  11/22/20 26.22 kg/m  05/24/20 25.58 kg/m    Assessment/Interventions: Review of patient past medical history, allergies, medications, health status, including review of consultants reports, laboratory and other test data, was performed as part of comprehensive evaluation and provision of chronic care management services.   SDOH:  (Social Determinants of Health) assessments and interventions performed: Yes   Financial Resource Strain: Low Risk    Difficulty of Paying Living Expenses: Not hard at all    SDOH Screenings   Alcohol Screen: Low Risk    Last Alcohol Screening Score (AUDIT): 1  Depression (PHQ2-9): Low Risk    PHQ-2 Score: 0  Financial Resource Strain: Low Risk    Difficulty of Paying Living Expenses: Not hard at all  Food Insecurity: No Food Insecurity   Worried About Charity fundraiser in the Last Year: Never true   Ran Out of Food in the Last Year: Never true  Housing: Low Risk    Last Housing Risk Score: 0  Physical Activity: Sufficiently Active   Days of Exercise per Week: 5 days   Minutes of Exercise per Session: 60 min  Social Connections: Engineer, building services of Communication with Friends and Family: More than three times a week   Frequency of Social Gatherings with Friends and Family: More than three times a week   Attends Religious Services: More than 4 times per year   Active Member of [employer] or Organizations: Yes   Attends Music therapist: More than 4 times per year   Marital Status: Married  Stress: No Stress Concern Present   Feeling of Stress : Not at all   Tobacco Use: Medium Risk   Smoking Tobacco Use: Former   Smokeless Tobacco Use: Never   Passive Exposure: Not on P[employer] consultant Needs: No Transportation Needs   Lack of Transportation (Medical): No   Lack of Transportation (Non-Medical): No    CCM Care Plan  No Known Allergies  Medications Reviewed Today     Reviewed by Edythe Clarity, [facility] (Pharmacist) on 03/10/21 at 11  Med List Status: <None>   Medication Order Taking? Sig Documenting Provider Last Dose Status Informant  0.9 %  sodium chloride infusion [id number]   Irene Shipper, MD  Active   amLODipine (NORVASC) 5 MG tablet [id number] Yes Take 1 tablet (5 mg total) by mouth daily. Susy Frizzle, MD Taking Active   atorvastatin (LIPITOR) 80 MG tablet [id number] Yes Take 1 tablet (80 mg total) by mouth daily. Susy Frizzle, MD Taking Active   cetirizine (ZYRTEC) 10 MG tablet [id number] Yes Take 10 mg by mouth daily as needed.  [provider] Taking Active Self  Cholecalciferol (VITAMIN D) 125 MCG (5000 UT) CAPS [id number] Yes Take 5,000 Units by mouth daily.  [provider] Taking Active   clopidogrel (PLAVIX) 75 MG tablet [id number] Yes Take 1 tablet (75 mg total) by mouth daily. Susy Frizzle, MD Taking Active   Coenzyme Q10 300 MG CAPS [id number] Yes Take 300 mg by mouth daily.  [provider] Taking Active Self  ezetimibe (ZETIA) 10 MG tablet [id number] Yes Take 1 tablet (10 mg total) by mouth daily. Lorretta Harp, MD Taking Active   latanoprost (XALATAN) 0.005 % ophthalmic solution [id number] Yes 1 drop at bedtime. [provider] Taking Active   Multiple Vitamin (MULTIVITAMIN WITH MINERALS) TABS tablet [id number] Yes Take 1 tablet by mouth daily. [provider] Taking Active Self  polyvinyl alcohol (LIQUIFILM TEARS) 1.4 % ophthalmic solution [id number] Yes Place 1 drop into both eyes daily as needed for dry eyes (allergies). [provider] Taking Active  Self  predniSONE (DELTASONE) 20 MG tablet [id number] Yes 3 tabs poqday 1-2, 2 tabs poqday 3-4, 1 tab poqday 5-6 Susy Frizzle, MD Taking Active   predniSONE (DELTASONE) 20 MG tablet [id number] Yes 3 tabs poqday 1-2, 2 tabs poqday 3-4, 1 tab poqday 5-6 Susy Frizzle, MD Taking Active             Patient Active Problem List   Diagnosis Date Noted   Subclavian artery stenosis, left ([place]) 05/29/2017   Prostate cancer screening 10/07/2014   History of tobacco abuse    Left-sided carotid artery disease s/p LCCA PTA/Stent 04/28/14 04/28/2014   PVD - Carotid, subclavian, bilat LE disease 03/05/2014   S/P RCIA, RCFA, REIA PTA 03/04/14 03/05/2014   Essential hypertension 02/02/2014   Vitamin D deficiency 02/02/2014   Hyperlipidemia 02/02/2014   Claudication of lower extremity ([place]) 02/02/2014   Bilateral carotid bruits 02/02/2014    Immunization History  Administered Date(s) Administered   Fluad Quad(high Dose 65+) 12/23/2018, 02/05/2020   Influenza,inj,Quad PF,6+ Mos 02/02/2014   Influenza-Unspecified 03/03/2018, 01/16/2021   PFIZER(Purple Top)SARS-COV-2 Vaccination 06/16/2019, 07/07/2019, 03/01/2020, 01/16/2021   Pneumococcal Conjugate-13 04/23/2016   Pneumococcal Polysaccharide-23 04/23/2017   Tdap 09/02/2010   Zoster, Live 11/07/2015    Conditions to be addressed/monitored:  HTN, PVD, HLD, Tobacco Use  Care Plan : General Pharmacy (Adult)  Updates made by Edythe Clarity, RPH since 03/10/2021 12:00 AM     Problem: HTN, PVD, HLD, Tobacco Use   Priority: High  Onset Date: 08/31/2020     Long-Range Goal: Patient-Specific Goal   Start Date: 09/01/2020  Expected End Date: 03/04/2021  Recent Progress: On track  Priority: High  Note:   Current Barriers:  none identified at this visit  Pharmacist Clinical Goal(s):  Patient will maintain control of BP and cholesterol as evidenced by home monitoring and routine labs  adhere to prescribed medication regimen as  evidenced by fill dates contact provider office for questions/concerns as evidenced notation of same in electronic health record through collaboration with  PharmD and provider.   Interventions: 1:1 collaboration with Buelah Manis, Modena Nunnery, MD regarding development and update of comprehensive plan of care as evidenced by provider attestation and co-signature Inter-disciplinary care team collaboration (see longitudinal plan of care) Comprehensive medication review performed; medication list updated in electronic medical record  Hypertension (BP goal <140/90) -Controlled -Current treatment: Amlodipine 25m daily -Medications previously tried: lisinopril (elevated K+)  -Current home readings: 130/70 normal readings -Current dietary habits: eating less processed foods, has completely cut out sugar -Current exercise habits: stationary bike 20-30 minutes 7 days per week -Denies hypotensive/hypertensive symptoms -Educated on BP goals and benefits of medications for prevention of heart attack, stroke and kidney damage; Daily salt intake goal < 2300 mg; Importance of home blood pressure monitoring; Symptoms of hypotension and importance of maintaining adequate hydration; -Counseled to monitor BP at home daily, document, and provide log at future appointments -Recommended to continue current medication  Update 03/09/21 145/70 today  128-145/65-70 - only 2 over 1567systolic He denies any symptoms Have asked him to continue to monitor BP at home. Report consistent > 1014systolic to providers. No changes to meds at this time - continue to be active with exercise.  Hyperlipidemia/PVD: (LDL goal < 70) -Controlled -Current treatment: Atorvastatin 877mdaily Ezetimibe 1081maily Clopidogrel 32m80mily -Medications previously tried: none noted  -Current dietary patterns: see above -Current exercise habits: see above -Educated on Cholesterol goals;  Benefits of statin for ASCVD risk  reduction; Importance of limiting foods high in cholesterol; Exercise goal of 150 minutes per week;  -Recently started Zetia 10mg36mly and tolerating it well -Most recent LDL and lipid panel were excellent, congratulated patient on this -Recommended to continue current medication Recommended routine follow up and labs annually  Update 03/10/21 Continues to do well on Zetia and Atorvastatin. LDL has not been rechecked since last visit with me. Reinforced adherence - patient reports 100%. No changes needed at this time, LDL at goal. Continue routine screenings as previous.  Patient Goals/Self-Care Activities Patient will:  - take medications as prescribed focus on medication adherence by pill box check blood pressure daily, document, and provide at future appointments target a minimum of 150 minutes of moderate intensity exercise weekly  Follow Up Plan: The care management team will reach out to the patient again over the next 180 days.            Medication Assistance: None required.  Patient affirms current coverage meets needs.  Patient's preferred pharmacy is:  [id number], [place] - [address] [address]: [phone] pill box? Yes Pt endorses 100% compliance  We discussed: Benefits of medication synchronization, packaging and delivery as well as enhanced pharmacist oversight with [employer]. Patient decided to: Continue current medication management strategy  Care Plan and Follow Up Patient Decision:  Patient agrees to Care Plan and Follow-up.  Plan: The care management team will reach out to the patient again over the next 180 days.  ChristBeverly MilchmD Clinical Pharmacist [facility] [place] [phone]

## 2021-03-09 ENCOUNTER — Ambulatory Visit (INDEPENDENT_AMBULATORY_CARE_PROVIDER_SITE_OTHER): Payer: Medicare Other | Admitting: Pharmacist

## 2021-03-09 DIAGNOSIS — E782 Mixed hyperlipidemia: Secondary | ICD-10-CM

## 2021-03-09 DIAGNOSIS — I10 Essential (primary) hypertension: Secondary | ICD-10-CM

## 2021-03-10 NOTE — Patient Instructions (Addendum)
Visit Information   Goals Addressed             This Visit's Progress    Track and Manage My Blood Pressure-Hypertension   On track    Timeframe:  Long-Range Goal Priority:  High Start Date:   09/01/20                          Expected End Date:    03/04/21                   Follow Up Date 12/02/20    - check blood pressure weekly - choose a place to take my blood pressure (home, clinic or office, retail store) - write blood pressure results in a log or diary    Why is this important?   You won't feel high blood pressure, but it can still hurt your blood vessels.  High blood pressure can cause heart or kidney problems. It can also cause a stroke.  Making lifestyle changes like losing a little weight or eating less salt will help.  Checking your blood pressure at home and at different times of the day can help to control blood pressure.  If the doctor prescribes medicine remember to take it the way the doctor ordered.  Call the office if you cannot afford the medicine or if there are questions about it.     Notes:        Patient Care Plan: General Pharmacy (Adult)     Problem Identified: HTN, PVD, HLD, Tobacco Use   Priority: High  Onset Date: 08/31/2020     Long-Range Goal: Patient-Specific Goal   Start Date: 09/01/2020  Expected End Date: 03/04/2021  Recent Progress: On track  Priority: High  Note:   Current Barriers:  none identified at this visit  Pharmacist Clinical Goal(s):  Patient will maintain control of BP and cholesterol as evidenced by home monitoring and routine labs  adhere to prescribed medication regimen as evidenced by fill dates contact provider office for questions/concerns as evidenced notation of same in electronic health record through collaboration with PharmD and provider.   Interventions: 1:1 collaboration with Buelah Manis, Modena Nunnery, MD regarding development and update of comprehensive plan of care as evidenced by provider attestation and  co-signature Inter-disciplinary care team collaboration (see longitudinal plan of care) Comprehensive medication review performed; medication list updated in electronic medical record  Hypertension (BP goal <140/90) -Controlled -Current treatment: Amlodipine 5mg  daily -Medications previously tried: lisinopril (elevated K+)  -Current home readings: 130/70 normal readings -Current dietary habits: eating less processed foods, has completely cut out sugar -Current exercise habits: stationary bike 20-30 minutes 7 days per week -Denies hypotensive/hypertensive symptoms -Educated on BP goals and benefits of medications for prevention of heart attack, stroke and kidney damage; Daily salt intake goal < 2300 mg; Importance of home blood pressure monitoring; Symptoms of hypotension and importance of maintaining adequate hydration; -Counseled to monitor BP at home daily, document, and provide log at future appointments -Recommended to continue current medication  Update 03/09/21 145/70 today  128-145/65-70 - only 2 over 657 systolic He denies any symptoms Have asked him to continue to monitor BP at home. Report consistent > 846 systolic to providers. No changes to meds at this time - continue to be active with exercise.  Hyperlipidemia/PVD: (LDL goal < 70) -Controlled -Current treatment: Atorvastatin 80mg  daily Ezetimibe 10mg  daily Clopidogrel 75mg  daily -Medications previously tried: none noted  -Current dietary patterns: see  above -Current exercise habits: see above -Educated on Cholesterol goals;  Benefits of statin for ASCVD risk reduction; Importance of limiting foods high in cholesterol; Exercise goal of 150 minutes per week;  -Recently started Zetia 10mg  daily and tolerating it well -Most recent LDL and lipid panel were excellent, congratulated patient on this -Recommended to continue current medication Recommended routine follow up and labs annually  Update  03/10/21 Continues to do well on Zetia and Atorvastatin. LDL has not been rechecked since last visit with me. Reinforced adherence - patient reports 100%. No changes needed at this time, LDL at goal. Continue routine screenings as previous.  Patient Goals/Self-Care Activities Patient will:  - take medications as prescribed focus on medication adherence by pill box check blood pressure daily, document, and provide at future appointments target a minimum of 150 minutes of moderate intensity exercise weekly  Follow Up Plan: The care management team will reach out to the patient again over the next 180 days.           Patient verbalizes understanding of instructions provided today and agrees to view in [place].  Telephone follow up appointment with pharmacy team member scheduled for: 6 months  Edythe Clarity, [place]

## 2021-03-16 ENCOUNTER — Other Ambulatory Visit: Payer: Self-pay | Admitting: Family Medicine

## 2021-03-22 DIAGNOSIS — E782 Mixed hyperlipidemia: Secondary | ICD-10-CM

## 2021-03-22 DIAGNOSIS — I10 Essential (primary) hypertension: Secondary | ICD-10-CM

## 2021-04-06 ENCOUNTER — Other Ambulatory Visit: Payer: Self-pay

## 2021-04-06 ENCOUNTER — Ambulatory Visit (INDEPENDENT_AMBULATORY_CARE_PROVIDER_SITE_OTHER): Payer: Medicare Other | Admitting: Family Medicine

## 2021-04-06 ENCOUNTER — Encounter: Payer: Self-pay | Admitting: Family Medicine

## 2021-04-06 VITALS — BP 132/88 | HR 95 | Resp 18 | Ht 71.0 in | Wt 189.0 lb

## 2021-04-06 DIAGNOSIS — Z0000 Encounter for general adult medical examination without abnormal findings: Secondary | ICD-10-CM

## 2021-04-06 DIAGNOSIS — I10 Essential (primary) hypertension: Secondary | ICD-10-CM

## 2021-04-06 DIAGNOSIS — Z125 Encounter for screening for malignant neoplasm of prostate: Secondary | ICD-10-CM

## 2021-04-06 DIAGNOSIS — I739 Peripheral vascular disease, unspecified: Secondary | ICD-10-CM | POA: Diagnosis not present

## 2021-04-06 DIAGNOSIS — E782 Mixed hyperlipidemia: Secondary | ICD-10-CM

## 2021-04-06 NOTE — Progress Notes (Signed)
Subjective:    Patient ID: Jesus Klein, male    DOB: [birth year], 70 y.o.   MRN: [id number]  HPI Patient is a very pleasant 70 year old Caucasian gentleman here today for complete physical exam.  He has a history of tobacco abuse but quit smoking in 2009.  We discussed performing lung cancer screening with a CT scan but the patient politely declined.  He also has a history of left-sided carotid artery disease and right-sided carotid artery disease.  However he had carotid Dopplers performed earlier this year that showed 1 to 39% internal carotid artery stenosis.  He has known peripheral artery disease.  Please see the past medical history.  This included high-grade ostial left common carotid artery stenosis that required a stent.  He also had right common iliac and external iliac stenting.  He is currently on Plavix.  He sees cardiology every 6 months.  Last colonoscopy was performed in 2021 and was significant for 8 polyps!  They were all tubular adenomas.  Repeat colonoscopy was recommended in 3 years.  PSA was checked last in 2021.  He denies any falls or depression or memory loss.  Immunizations are reviewed and he is due for Shingrix as well as a tetanus vaccine. Immunization History  Administered Date(s) Administered   Fluad Quad(high Dose 65+) 12/23/2018, 02/05/2020   Influenza,inj,Quad PF,6+ Mos 02/02/2014   Influenza-Unspecified 03/03/2018, 01/16/2021   PFIZER(Purple Top)SARS-COV-2 Vaccination 06/16/2019, 07/07/2019, 03/01/2020, 01/16/2021   Pneumococcal Conjugate-13 04/23/2016   Pneumococcal Polysaccharide-23 04/23/2017   Tdap 09/02/2010   Zoster, Live 11/07/2015    Past Medical History:  Diagnosis Date   Allergy    seasonal allergies   Arthritis    arms/shoulders   Blood transfusion without reported diagnosis 2009   with hip replacement   History of tobacco abuse    Hyperlipidemia    on meds   Hypertension 2010   on meds   Left-sided carotid artery disease ([place])     high-grade ostial left common carotid artery stenosis   Peripheral arterial disease ([place])    a. 02/2014 - right common iliac and external iliac intervention. b. H/o PTA/stenting of left common carotid artery stenosis. Known 80% left subclavian artery stenosis without UE claudication/steal sx.   Subclavian artery stenosis, left [facility])    Past Surgical History:  Procedure Laterality Date   CAROTID STENT INSERTION N/A 04/28/2014   Procedure: CAROTID STENT INSERTION;  Surgeon: Serafina Mitchell, MD;  Location: [facility] CATH LAB;  Service: Cardiovascular;  Laterality: N/A;   ILIAC ARTERY STENT Right 03/04/2014   w/as well as right external iliac and common femoral artery stenting/notes 03/04/2014   JOINT REPLACEMENT     LOWER EXTREMITY ANGIOGRAM N/A 03/04/2014   Procedure: LOWER EXTREMITY ANGIOGRAM;  Surgeon: Lorretta Harp, MD;  Location: [facility] CATH LAB;  Service: Cardiovascular;  Laterality: N/A;   TOTAL HIP ARTHROPLASTY Right 10/2005   WISDOM TOOTH EXTRACTION     Current Outpatient Medications on File Prior to Visit  Medication Sig Dispense Refill   amLODipine (NORVASC) 5 MG tablet Take 1 tablet (5 mg total) by mouth daily. 90 tablet 2   atorvastatin (LIPITOR) 80 MG tablet Take 1 tablet (80 mg total) by mouth daily. 90 tablet 2   cetirizine (ZYRTEC) 10 MG tablet Take 10 mg by mouth daily as needed.      Cholecalciferol (VITAMIN D) 125 MCG (5000 UT) CAPS Take 5,000 Units by mouth daily.      clopidogrel (PLAVIX) 75 MG tablet TAKE 1  TABLET BY MOUTH EVERY DAY 90 tablet 0   Coenzyme Q10 300 MG CAPS Take 300 mg by mouth daily.      ezetimibe (ZETIA) 10 MG tablet Take 1 tablet (10 mg total) by mouth daily. 90 tablet 3   latanoprost (XALATAN) 0.005 % ophthalmic solution 1 drop at bedtime.     Multiple Vitamin (MULTIVITAMIN WITH MINERALS) TABS tablet Take 1 tablet by mouth daily.     polyvinyl alcohol (LIQUIFILM TEARS) 1.4 % ophthalmic solution Place 1 drop into both eyes daily as needed for dry eyes  (allergies).     Current Facility-Administered Medications on File Prior to Visit  Medication Dose Route Frequency Provider Last Rate Last Admin   0.9 %  sodium chloride infusion  500 mL Intravenous Continuous Irene Shipper, MD       No Known Allergies Social History   Socioeconomic History   Marital status: Married    Spouse name: Santiago Glad   Number of children: 2   Years of education: Not on file   Highest education level: Not on file  Occupational History   Not on file  Tobacco Use   Smoking status: Former    Packs/day: 1.50    Years: 30.00    Pack years: 45.00    Types: Cigarettes    Quit date: 01/19/2009    Years since quitting: 12.2   Smokeless tobacco: Never  Vaping Use   Vaping Use: Never used  Substance and Sexual Activity   Alcohol use: Yes    Alcohol/week: 2.0 standard drinks    Types: 2 Cans of beer per week   Drug use: No   Sexual activity: Yes    Birth control/protection: None  Other Topics Concern   Not on file  Social History Narrative   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 He may occasionally lift 20 pounds but most is done by machine.    Mix of standing, sitting, moving around.      Used to walk a lot and do a lot outside. Got a treadmill to use during cold weather.   Married.   2 daughters- grown.         Social Determinants of Health   Financial Resource Strain: Low Risk    Difficulty of Paying Living Expenses: Not hard at all  Food Insecurity: No Food Insecurity   Worried About Charity fundraiser in the Last Year: Never true   [place] in the Last Year: Never true  Transportation Needs: No Transportation Needs   Lack of Transportation (Medical): No   Lack of Transportation (Non-Medical): No  Physical Activity: Sufficiently Active   Days of Exercise per Week: 5 days   Minutes of Exercise per Session: 60 min  Stress: No Stress Concern Present   Feeling of Stress : Not at all   Social Connections: Socially Integrated   Frequency of Communication with Friends and Family: More than three times a week   Frequency of Social Gatherings with Friends and Family: More than three times a week   Attends Religious Services: More than 4 times per year   Active Member of [employer] or Organizations: Yes   Attends Music therapist: More than 4 times per year   Marital Status: Married  Human resources officer Violence: Not At Risk   Fear of Current or Ex-Partner: No   Emotionally Abused: No   Physically Abused: No   Sexually  Abused: No     Review of Systems  All other systems reviewed and are negative.     Objective:   Physical Exam Vitals reviewed.  Constitutional:      General: He is not in acute distress.    Appearance: Normal appearance. He is normal weight. He is not ill-appearing, toxic-appearing or diaphoretic.  HENT:     Right Ear: Tympanic membrane and ear canal normal.     Left Ear: Tympanic membrane and ear canal normal.     Mouth/Throat:     Mouth: Mucous membranes are moist.     Pharynx: No oropharyngeal exudate or posterior oropharyngeal erythema.  Eyes:     Extraocular Movements: Extraocular movements intact.     Pupils: Pupils are equal, round, and reactive to light.  Neck:     Vascular: Carotid bruit present.  Cardiovascular:     Rate and Rhythm: Normal rate and regular rhythm.     Pulses: Normal pulses.     Heart sounds: Normal heart sounds.    No friction rub. No gallop.  Pulmonary:     Effort: Pulmonary effort is normal. No respiratory distress.     Breath sounds: Normal breath sounds. No stridor. No wheezing, rhonchi or rales.  Chest:     Chest wall: No tenderness.  Abdominal:     General: Abdomen is flat. Bowel sounds are normal. There is no distension.     Palpations: Abdomen is soft. There is no mass.     Tenderness: There is no abdominal tenderness. There is no right CVA tenderness, left CVA tenderness, guarding or rebound.      Hernia: No hernia is present.  Musculoskeletal:     Right shoulder: No bony tenderness. Decreased range of motion. Normal strength.     Left shoulder: No bony tenderness. Decreased range of motion. Normal strength.     Cervical back: Normal range of motion and neck supple. No rigidity or tenderness.     Right lower leg: No edema.     Left lower leg: No edema.  Lymphadenopathy:     Cervical: No cervical adenopathy.  Skin:    Findings: No lesion or rash.  Neurological:     General: No focal deficit present.     Mental Status: He is alert and oriented to person, place, and time. Mental status is at baseline.     Cranial Nerves: No cranial nerve deficit.     Motor: No weakness.     Gait: Gait normal.  Psychiatric:        Mood and Affect: Mood normal.        Behavior: Behavior normal.        Thought Content: Thought content normal.        Judgment: Judgment normal.          Assessment & Plan:   Encounter for Medicare annual wellness exam - Plan: CBC with Differential/Platelet, COMPLETE METABOLIC PANEL WITH GFR, Lipid panel, PSA  Essential hypertension - Plan: CBC with Differential/Platelet, COMPLETE METABOLIC PANEL WITH GFR, Lipid panel  PVD - Carotid, subclavian, bilat LE disease - Plan: CBC with Differential/Platelet, COMPLETE METABOLIC PANEL WITH GFR, Lipid panel  Prostate cancer screening - Plan: PSA  Mixed hyperlipidemia Regarding immunizations he is due for a tetanus shot and Shingrix.  We discussed these at length and he defers them for now.  Regarding cancer screening, I will check a PSA to screen for prostate cancer.  Colonoscopy is due in 2024.  We discussed lung cancer  screening with a CT scan but he politely declined.  Blood pressure is excellent.  I will check a CBC CMP lipid panel and screening for prostate cancer with a PSA.  Given his history of peripheral vascular disease, his goal LDL cholesterol is less than 70.  Blood pressure today is at goal.  Patient is  already been screened for AAA.  Regular anticipatory guidance is provided.

## 2021-04-07 LAB — CBC WITH DIFFERENTIAL/PLATELET
Absolute Monocytes: 608 cells/uL (ref 200–950)
Basophils Absolute: 30 cells/uL (ref 0–200)
Basophils Relative: 0.5 %
Eosinophils Absolute: 171 cells/uL (ref 15–500)
Eosinophils Relative: 2.9 %
HCT: 49.5 % (ref 38.5–50.0)
Hemoglobin: 16.5 g/dL (ref 13.2–17.1)
Lymphs Abs: 2360 cells/uL (ref 850–3900)
MCH: 30.5 pg (ref 27.0–33.0)
MCHC: 33.3 g/dL (ref 32.0–36.0)
MCV: 91.5 fL (ref 80.0–100.0)
MPV: 9.7 fL (ref 7.5–12.5)
Monocytes Relative: 10.3 %
Neutro Abs: 2732 cells/uL (ref 1500–7800)
Neutrophils Relative %: 46.3 %
Platelets: 302 10*3/uL (ref 140–400)
RBC: 5.41 10*6/uL (ref 4.20–5.80)
RDW: 12.7 % (ref 11.0–15.0)
Total Lymphocyte: 40 %
WBC: 5.9 10*3/uL (ref 3.8–10.8)

## 2021-04-07 LAB — COMPLETE METABOLIC PANEL WITH GFR
AG Ratio: 1.9 (calc) (ref 1.0–2.5)
ALT: 25 U/L (ref 9–46)
AST: 25 U/L (ref 10–35)
Albumin: 4.4 g/dL (ref 3.6–5.1)
Alkaline phosphatase (APISO): 64 U/L (ref 35–144)
BUN: 14 mg/dL (ref 7–25)
CO2: 29 mmol/L (ref 20–32)
Calcium: 10 mg/dL (ref 8.6–10.3)
Chloride: 104 mmol/L (ref 98–110)
Creat: 0.85 mg/dL (ref 0.70–1.35)
Globulin: 2.3 g/dL (calc) (ref 1.9–3.7)
Glucose, Bld: 96 mg/dL (ref 65–99)
Potassium: 4.7 mmol/L (ref 3.5–5.3)
Sodium: 140 mmol/L (ref 135–146)
Total Bilirubin: 0.8 mg/dL (ref 0.2–1.2)
Total Protein: 6.7 g/dL (ref 6.1–8.1)
eGFR: 94 mL/min/{1.73_m2} (ref >=60)

## 2021-04-07 LAB — PSA: PSA: 1.61 ng/mL (ref <=4.00)

## 2021-04-07 LAB — LIPID PANEL
Cholesterol: 143 mg/dL (ref <200)
HDL: 48 mg/dL (ref >=40)
LDL Cholesterol (Calc): 75 mg/dL (calc)
Non-HDL Cholesterol (Calc): 95 mg/dL (calc) (ref <130)
Total CHOL/HDL Ratio: 3 (calc) (ref <5.0)
Triglycerides: 117 mg/dL (ref <150)

## 2021-05-08 ENCOUNTER — Other Ambulatory Visit: Payer: Self-pay | Admitting: Cardiovascular Disease

## 2021-05-10 DIAGNOSIS — H401131 Primary open-angle glaucoma, bilateral, mild stage: Secondary | ICD-10-CM | POA: Diagnosis not present

## 2021-05-26 ENCOUNTER — Ambulatory Visit (HOSPITAL_BASED_OUTPATIENT_CLINIC_OR_DEPARTMENT_OTHER)
Admission: RE | Admit: 2021-05-26 | Discharge: 2021-05-26 | Disposition: A | Discharge status: Home / Self Care | Payer: Medicare Other | Source: Ambulatory Visit | Attending: Cardiovascular Disease | Admitting: Cardiovascular Disease

## 2021-05-26 ENCOUNTER — Telehealth: Payer: Self-pay | Admitting: Pharmacist

## 2021-05-26 ENCOUNTER — Ambulatory Visit (HOSPITAL_COMMUNITY)
Admission: RE | Admit: 2021-05-26 | Discharge: 2021-05-26 | Disposition: A | Discharge status: Home / Self Care | Payer: Medicare Other | Source: Ambulatory Visit | Attending: Cardiology | Admitting: Cardiology

## 2021-05-26 ENCOUNTER — Other Ambulatory Visit (HOSPITAL_COMMUNITY): Payer: Self-pay | Admitting: Cardiovascular Disease

## 2021-05-26 ENCOUNTER — Other Ambulatory Visit: Payer: Self-pay

## 2021-05-26 DIAGNOSIS — Z95820 Peripheral vascular angioplasty status with implants and grafts: Secondary | ICD-10-CM

## 2021-05-26 DIAGNOSIS — I6523 Occlusion and stenosis of bilateral carotid arteries: Secondary | ICD-10-CM

## 2021-05-26 DIAGNOSIS — I745 Embolism and thrombosis of iliac artery: Secondary | ICD-10-CM | POA: Diagnosis not present

## 2021-05-26 DIAGNOSIS — Z95828 Presence of other vascular implants and grafts: Secondary | ICD-10-CM

## 2021-05-26 DIAGNOSIS — I739 Peripheral vascular disease, unspecified: Secondary | ICD-10-CM

## 2021-05-26 NOTE — Progress Notes (Signed)
° ° °  Chronic Care Management Pharmacy Assistant   Name: Jesus Klein  MRN: [id number] DOB: June 02, 1950   Reason for Encounter: Disease State - Diabetes Call     Recent office visits:  04/06/21 Annual Medicare Wellness Completed  Recent consult visits:  None noted.  Hospital visits:  None in previous 6 months  Medications: Outpatient Encounter Medications as of 05/26/2021  Medication Sig   amLODipine (NORVASC) 5 MG tablet Take 1 tablet (5 mg total) by mouth daily.   atorvastatin (LIPITOR) 80 MG tablet Take 1 tablet (80 mg total) by mouth daily.   cetirizine (ZYRTEC) 10 MG tablet Take 10 mg by mouth daily as needed.    Cholecalciferol (VITAMIN D) 125 MCG (5000 UT) CAPS Take 5,000 Units by mouth daily.    clopidogrel (PLAVIX) 75 MG tablet TAKE 1 TABLET BY MOUTH EVERY DAY   Coenzyme Q10 300 MG CAPS Take 300 mg by mouth daily.    ezetimibe (ZETIA) 10 MG tablet TAKE 1 TABLET BY MOUTH EVERY DAY   latanoprost (XALATAN) 0.005 % ophthalmic solution 1 drop at bedtime.   Multiple Vitamin (MULTIVITAMIN WITH MINERALS) TABS tablet Take 1 tablet by mouth daily.   polyvinyl alcohol (LIQUIFILM TEARS) 1.4 % ophthalmic solution Place 1 drop into both eyes daily as needed for dry eyes (allergies).   Facility-Administered Encounter Medications as of 05/26/2021  Medication   0.9 %  sodium chloride infusion   Current antihypertensive regimen:  Amlodipine 5mg  daily  How often are you checking your Blood Pressure?  Patient reported    Current home BP readings:    What recent interventions/DTPs have been made by any provider to improve Blood Pressure control since last CPP Visit:  Patient reported    Any recent hospitalizations or ED visits since last visit with CPP?  Patient has not had any ED visits or hospitalizations since last visit with CPP.   What diet changes have been made to improve Blood Pressure Control?     What exercise is being done to improve your Blood Pressure Control?       Adherence Review: Is the patient currently on ACE/ARB medication? No Does the patient have >5 day gap between last estimated fill dates? No   Care Gaps  AWV: done 02/23/21 Colonoscopy: done 02/01/20 DM Eye Exam: N/A DM Foot Exam: N/A Microalbumin: N/A HbgAIC: N/A DEXA: N/A Mammogram: N/A   Star Rating Drugs: atorvastatin (LIPITOR) 80 MG tablet - last filled 04/01/21 90 days   Future Appointments  Date Time Provider [place]  06/07/2021 10:45 AM Lorretta Harp, MD [facility]  09/28/2021  3:45 PM [facility]-CCM PHARMACIST [facility] None  02/23/2022  9:45 AM B[facility]-NURSE HEALTH ADVISOR [facility] None    Multiple attempts were made to contact patient. Attempts were unsuccessful. / ls,CMA.  Jobe Gibbon, [facility] Clinical Pharmacist Assistant  [phone]

## 2021-06-07 ENCOUNTER — Ambulatory Visit: Payer: Medicare Other | Admitting: Cardiovascular Disease

## 2021-06-07 ENCOUNTER — Other Ambulatory Visit: Payer: Self-pay

## 2021-06-07 ENCOUNTER — Encounter: Payer: Self-pay | Admitting: Cardiovascular Disease

## 2021-06-07 DIAGNOSIS — I10 Essential (primary) hypertension: Secondary | ICD-10-CM | POA: Diagnosis not present

## 2021-06-07 DIAGNOSIS — I6522 Occlusion and stenosis of left carotid artery: Secondary | ICD-10-CM | POA: Diagnosis not present

## 2021-06-07 DIAGNOSIS — Z9862 Peripheral vascular angioplasty status: Secondary | ICD-10-CM

## 2021-06-07 DIAGNOSIS — E782 Mixed hyperlipidemia: Secondary | ICD-10-CM

## 2021-06-07 DIAGNOSIS — I771 Stricture of artery: Secondary | ICD-10-CM

## 2021-06-07 NOTE — Progress Notes (Signed)
06/07/2021 Jesus Klein   [birth year]  [id number]  Primary Physician Dennard Schaumann Cammie Mcgee, MD Primary Cardiologist: Lorretta Harp MD Lupe Carney, [place]  HPI:  Jesus Klein is a 71 y.o.  thin-appearing Caucasian male father of 2 children, grandfather and 2 grandchildren referred by Karis Juba PA-C for peripheral vascular evaluation.I last saw him in the office   03/04/2019. I initially saw him in the office 05/24/2020. He had Dopplers performed in the office today that showed high-grade ostial carotid, ostial/subclavian artery stenosis as well as an occluded left common iliac and high-grade right iliac stenosis. His cardiovascular risk factor profile is notable for a 30-pack-year history of tobacco abuse having stopped smoking 2 years ago as well as 2 hypertension. There is no family history of heart disease. He does have mild hyperlipidemia. He has never had a heart attack or stroke. Denies chest pain or shortness of breath. He did have a right total hip replacement performed by Dr. Sharol Given in 2007. He noticed lifestyle limiting claudication 6 months ago right greater than left. On 03/04/14 I performed aortic arch angiography, selective left carotid angiography, abdominal aortography as well as right common iliac and external iliac intervention. His right lower extremity claudication has completely resolved. His ABI has normalized. He does not notice claudication as much in his left leg. Because of his high grade near ostial left common carotid artery stenosis heunderwent PTCA and stenting on 04/28/14 with a 7 mm x 19 mm long Omnilink balloon expandable stent with excellent result. Follow-up lower extremity Dopplers and carotid Dopplers performed 05/23/17 suggested in-stent restenosis within the iliac stents are widely patent carotid stent.  His most recent Dopplers performed in January 2020 revealed patent carotid stent, and moderate in-stent restenosis within the right iliac stent although he denies  claudication.   Since I saw him in the office a year ago he continues to do well.  He denies claudication, chest pain or shortness of breath.  His most recent carotid Dopplers suggest patency of his left carotid stent.  Does have a high-frequency signal in his left subclavian artery with symmetric upper extremity blood pressures.  His aortoiliac Dopplers show occlusion of his left common iliac artery which was known from angiography in 2015 with progression of disease in his right external iliac artery.   Current Meds  Medication Sig   amLODipine (NORVASC) 5 MG tablet Take 1 tablet (5 mg total) by mouth daily.   atorvastatin (LIPITOR) 80 MG tablet Take 1 tablet (80 mg total) by mouth daily.   cetirizine (ZYRTEC) 10 MG tablet Take 10 mg by mouth daily as needed.    Cholecalciferol (VITAMIN D) 125 MCG (5000 UT) CAPS Take 5,000 Units by mouth daily.    clopidogrel (PLAVIX) 75 MG tablet TAKE 1 TABLET BY MOUTH EVERY DAY   Coenzyme Q10 300 MG CAPS Take 300 mg by mouth daily.    ezetimibe (ZETIA) 10 MG tablet TAKE 1 TABLET BY MOUTH EVERY DAY   latanoprost (XALATAN) 0.005 % ophthalmic solution 1 drop at bedtime.   Multiple Vitamin (MULTIVITAMIN WITH MINERALS) TABS tablet Take 1 tablet by mouth daily.   polyvinyl alcohol (LIQUIFILM TEARS) 1.4 % ophthalmic solution Place 1 drop into both eyes daily as needed for dry eyes (allergies).     No Known Allergies  Social History   Socioeconomic History   Marital status: Married    Spouse name: Santiago Glad   Number of children: 2   Years of education:  Not on file   Highest education level: Not on file  Occupational History   Not on file  Tobacco Use   Smoking status: Former    Packs/day: 1.50    Years: 30.00    Pack years: 45.00    Types: Cigarettes    Quit date: 01/19/2009    Years since quitting: 12.3   Smokeless tobacco: Never  Vaping Use   Vaping Use: Never used  Substance and Sexual Activity   Alcohol use: Yes    Alcohol/week: 2.0 standard  drinks    Types: 2 Cans of beer per week   Drug use: No   Sexual activity: Yes    Birth control/protection: None  Other Topics Concern   Not on file  Social History Narrative   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 He may occasionally lift 20 pounds but most is done by machine.    Mix of standing, sitting, moving around.      Used to walk a lot and do a lot outside. Got a treadmill to use during cold weather.   Married.   2 daughters- grown.         Social Determinants of Health   Financial Resource Strain: Low Risk    Difficulty of Paying Living Expenses: Not hard at all  Food Insecurity: No Food Insecurity   Worried About Charity fundraiser in the Last Year: Never true   [place] in the Last Year: Never true  Transportation Needs: No Transportation Needs   Lack of Transportation (Medical): No   Lack of Transportation (Non-Medical): No  Physical Activity: Sufficiently Active   Days of Exercise per Week: 5 days   Minutes of Exercise per Session: 60 min  Stress: No Stress Concern Present   Feeling of Stress : Not at all  Social Connections: Socially Integrated   Frequency of Communication with Friends and Family: More than three times a week   Frequency of Social Gatherings with Friends and Family: More than three times a week   Attends Religious Services: More than 4 times per year   Active Member of [employer] or Organizations: Yes   Attends Music therapist: More than 4 times per year   Marital Status: Married  Human resources officer Violence: Not At Risk   Fear of Current or Ex-Partner: No   Emotionally Abused: No   Physically Abused: No   Sexually Abused: No     Review of Systems: General: negative for chills, fever, night sweats or weight changes.  Cardiovascular: negative for chest pain, dyspnea on exertion, edema, orthopnea, palpitations, paroxysmal nocturnal dyspnea or shortness of  breath Dermatological: negative for rash Respiratory: negative for cough or wheezing Urologic: negative for hematuria Abdominal: negative for nausea, vomiting, diarrhea, bright red blood per rectum, melena, or hematemesis Neurologic: negative for visual changes, syncope, or dizziness All other systems reviewed and are otherwise negative except as noted above.    Blood pressure (!) 164/72, pulse 74, height 5\' 11"  (1.803 m), weight 190 lb 3.2 oz (86.3 kg), SpO2 99 %.  General appearance: alert and no distress Neck: no adenopathy, no JVD, supple, symmetrical, trachea midline, thyroid not enlarged, symmetric, no tenderness/mass/nodules, and bilateral carotid and subclavian bruits Lungs: clear to auscultation bilaterally Heart: regular rate and rhythm, S1, S2 normal, no murmur, click, rub or gallop Extremities: extremities normal, atraumatic, no cyanosis or edema Pulses: Diminished pedal pulses Skin: Skin color, texture,  turgor normal. No rashes or lesions Neurologic: Grossly normal  EKG NSR at 74 with PVCs  ASSESSMENT AND PLAN:   Essential hypertension History of essential hypertension a blood pressure measured today at 164/72 in his right arm.  He is on amlodipine.  Hyperlipidemia History of hyperlipidemia on atorvastatin and Zetia with lipid profile performed 04/06/2021 revealing total cholesterol 143, LDL 75 and HDL 48.  S/P RCIA, RCFA, REIA PTA 03/04/14 History of peripheral arterial disease status post right common and external iliac intervention 03/04/2014.  At that time I demonstrated that his left common iliac was totally occluded although he has been asymptomatic.  Recent Dopplers have shown progressive disease/restenosis within his right external iliac artery although his right ABI is normal on his left of 0.59 consistent with his known occlusion.  He denies claudication.  Left-sided carotid artery disease s/p LCCA PTA/Stent 04/28/14 History of ostial left common carotid  stenting by myself 04/28/2014 with a 7 mm x 19 mm long Omnilink balloon expandable stent with an excellent result.  His Doppler suggest this to remain patent.  Subclavian artery stenosis, left (HCC) History of 80% left subclavian artery stenosis at the time of angiography 05/06/2013 although he has no symptoms of left upper extremity claudication or subclavian steal symptoms.  His Dopplers do however suggest a high-frequency signal in his left subclavian artery with atypical vertebral blood flow.  His upper extremity blood pressures were symmetric on 05/26/2021 at the time of carotid Doppler studies.     Lorretta Harp MD FACP,FACC,FAHA, [facility] 06/07/2021 11:48 AM

## 2021-06-07 NOTE — Patient Instructions (Signed)
Medication Instructions:  Your physician recommends that you continue on your current medications as directed. Please refer to the Current Medication list given to you today.  *If you need a refill on your cardiac medications before your next appointment, please call your pharmacy*   Testing/Procedures: Your physician has requested that you have a carotid duplex. This test is an ultrasound of the carotid arteries in your neck. It looks at blood flow through these arteries that supply the brain with blood. Allow one hour for this exam. There are no restrictions or special instructions.  Dr. Gwenlyn Found has recommended that you have an Ultrasound of your AORTA/IVC/ILIACS.   To prepare for this test:  No food after 11PM the night before. Water is OK. (Don't drink liquids if you have been instructed not to for ANOTHER test).  Avoid foods that produce bowel gas, for 24 hours prior to exam (see below). No breakfast, no chewing gum, no smoking or carbonated beverages. Patient may take morning medications with water. Come in for test at least 15 minutes early to register.   Your physician has requested that you have a lower extremity arterial duplex. This test is an ultrasound of the arteries in the legs. It looks at arterial blood flow in the legs. Allow one hour for Lower Arterial scans. There are no restrictions or special instructions   Your physician has requested that you have an ankle brachial index (ABI). During this test an ultrasound and blood pressure cuff are used to evaluate the arteries that supply the arms and legs with blood. Allow thirty minutes for this exam. There are no restrictions or special instructions. These will be done in Feb 2024. These procedures are done at [place].    Follow-Up: At [facility], you and your health needs are our priority.  As part of our continuing mission to provide you with exceptional heart care, we have created designated Provider Care  Teams.  These Care Teams include your primary Cardiologist (physician) and Advanced Practice Providers (APPs -  Physician Assistants and Nurse Practitioners) who all work together to provide you with the care you need, when you need it.  We recommend signing up for the patient portal called "MyChart".  Sign up information is provided on this After Visit Summary.  MyChart is used to connect with patients for Virtual Visits (Telemedicine).  Patients are able to view lab/test results, encounter notes, upcoming appointments, etc.  Non-urgent messages can be sent to your provider as well.   To learn more about what you can do with MyChart, go to [email or web address].    Your next appointment:   12 month(s)  The format for your next appointment:   In Person  Provider:   Quay Burow, MD

## 2021-06-07 NOTE — Assessment & Plan Note (Signed)
History of peripheral arterial disease status post right common and external iliac intervention 03/04/2014.  At that time I demonstrated that his left common iliac was totally occluded although he has been asymptomatic.  Recent Dopplers have shown progressive disease/restenosis within his right external iliac artery although his right ABI is normal on his left of 0.59 consistent with his known occlusion.  He denies claudication.

## 2021-06-07 NOTE — Assessment & Plan Note (Signed)
History of hyperlipidemia on atorvastatin and Zetia with lipid profile performed 04/06/2021 revealing total cholesterol 143, LDL 75 and HDL 48.

## 2021-06-07 NOTE — Assessment & Plan Note (Signed)
History of essential hypertension a blood pressure measured today at 164/72 in his right arm.  He is on amlodipine.

## 2021-06-07 NOTE — Assessment & Plan Note (Signed)
History of ostial left common carotid stenting by myself 04/28/2014 with a 7 mm x 19 mm long Omnilink balloon expandable stent with an excellent result.  His Doppler suggest this to remain patent.

## 2021-06-07 NOTE — Assessment & Plan Note (Signed)
History of 80% left subclavian artery stenosis at the time of angiography 05/06/2013 although he has no symptoms of left upper extremity claudication or subclavian steal symptoms.  His Dopplers do however suggest a high-frequency signal in his left subclavian artery with atypical vertebral blood flow.  His upper extremity blood pressures were symmetric on 05/26/2021 at the time of carotid Doppler studies.

## 2021-06-14 ENCOUNTER — Other Ambulatory Visit: Payer: Self-pay | Admitting: Family Medicine

## 2021-07-10 ENCOUNTER — Telehealth: Payer: Self-pay | Admitting: Pharmacist

## 2021-07-10 NOTE — Progress Notes (Signed)
? ? ?  Chronic Care Management ?Pharmacy Assistant  ? ? ?Name: Jesus Klein  MRN: [id number] DOB: Nov 21, 1950 ? ? ?Reason for Encounter: Disease State - Hypertension Call  ?  ? ? ?Recent office visits:  ?04/06/21 Annual Medicare Wellness Completed ? ?Recent consult visits:  ?06/07/21 Quay Burow, MD - Hypertension - EKG performed. Carotid US ordered. No changes in current medications. Follow up in 1 year.  ? ? ?Hospital visits:  ?None in previous 6 months ? ?Medications: ?Outpatient Encounter Medications as of 07/10/2021  ?Medication Sig  ? amLODipine (NORVASC) 5 MG tablet Take 1 tablet (5 mg total) by mouth daily.  ? atorvastatin (LIPITOR) 80 MG tablet Take 1 tablet (80 mg total) by mouth daily.  ? cetirizine (ZYRTEC) 10 MG tablet Take 10 mg by mouth daily as needed.   ? Cholecalciferol (VITAMIN D) 125 MCG (5000 UT) CAPS Take 5,000 Units by mouth daily.   ? clopidogrel (PLAVIX) 75 MG tablet TAKE 1 TABLET BY MOUTH EVERY DAY  ? Coenzyme Q10 300 MG CAPS Take 300 mg by mouth daily.   ? ezetimibe (ZETIA) 10 MG tablet TAKE 1 TABLET BY MOUTH EVERY DAY  ? latanoprost (XALATAN) 0.005 % ophthalmic solution 1 drop at bedtime.  ? Multiple Vitamin (MULTIVITAMIN WITH MINERALS) TABS tablet Take 1 tablet by mouth daily.  ? polyvinyl alcohol (LIQUIFILM TEARS) 1.4 % ophthalmic solution Place 1 drop into both eyes daily as needed for dry eyes (allergies).  ? ?No facility-administered encounter medications on file as of 07/10/2021.  ? ? ?Current antihypertensive regimen:  ?Amlodipine '5mg'$  daily ? ?How often are you checking your Blood Pressure?  ?Patient reports he has been taking blood pressure in the am and mid day as well. ? ? ?Current home BP readings: 125-130/75-80 (range reported by patient) ? ? ?What recent interventions/DTPs have been made by any provider to improve Blood Pressure control since last CPP Visit:  ?Patient denied any recent changes to his current medication regimen.  ? ? ?Any recent hospitalizations or ED visits since  last visit with CPP?  ?Patient has not had any ED visits or hospitalizations since last visit with CPP. ? ? ?What diet changes have been made to improve Blood Pressure Control?  ?Patient reported he limits his salt intake in his diet.  ? ? ?What exercise is being done to improve your Blood Pressure Control?  ?Patient reports he uses a stationary bike 1 hour daily at home. He also reported he remains very active.  ? ? ? ?Adherence Review: ?Is the patient currently on ACE/ARB medication? No ?Does the patient have >5 day gap between last estimated fill dates? No ? ? ?Care Gaps ?  ?AWV: done 04/06/21 ?Colonoscopy: done 02/01/20 ?DM Eye Exam: N/A ?DM Foot Exam: N/A ?Microalbumin: N/A ?HbgAIC: N/A ?DEXA: N/A ?Mammogram: N/A ? ? ?Star Rating Drugs: ?Atorvastatin (LIPITOR) 80 MG tablet - last filled 04/01/21 90 days  ? ? ?Future Appointments  ?Date Time Provider [place]  ?02/23/2022  9:45 AM [facility]-NURSE HEALTH ADVISOR [facility]  ? ? ? ?Liza Showfety, CCMA ?Clinical Pharmacist Assistant  ?[phone] ? ? ?

## 2021-07-13 ENCOUNTER — Other Ambulatory Visit (HOSPITAL_COMMUNITY): Payer: Self-pay | Admitting: Cardiovascular Disease

## 2021-07-13 DIAGNOSIS — I6523 Occlusion and stenosis of bilateral carotid arteries: Secondary | ICD-10-CM

## 2021-07-13 DIAGNOSIS — Z95820 Peripheral vascular angioplasty status with implants and grafts: Secondary | ICD-10-CM

## 2021-07-13 DIAGNOSIS — Z95828 Presence of other vascular implants and grafts: Secondary | ICD-10-CM

## 2021-07-13 DIAGNOSIS — I739 Peripheral vascular disease, unspecified: Secondary | ICD-10-CM

## 2021-07-14 ENCOUNTER — Telehealth: Payer: Self-pay

## 2021-07-14 NOTE — Telephone Encounter (Signed)
Pt called having lower back pain, would like to know if you can prescribe him some prednisone?  ? ?Please advice ?

## 2021-07-17 ENCOUNTER — Other Ambulatory Visit: Payer: Self-pay | Admitting: Family Medicine

## 2021-07-17 MED ORDER — PREDNISONE 20 MG PO TABS: ORAL_TABLET | ORAL | 0 refills | Status: DC

## 2021-07-18 NOTE — Telephone Encounter (Signed)
Called left msg to pt that meds has been sent to pharmacy per Dr. Dennard Schaumann. 07/18/21 ?

## 2021-09-14 ENCOUNTER — Other Ambulatory Visit: Payer: Self-pay | Admitting: Family Medicine

## 2021-09-15 NOTE — Telephone Encounter (Signed)
Requested medication (s) are due for refill today: yes  Requested medication (s) are on the active medication list: yes  Last refill:  12/23/20 #90 2 RF  Future visit scheduled: no  Notes to clinic:  called pt and LM on VM to call back [phone]   Requested Prescriptions  Pending Prescriptions Disp Refills   amLODipine (NORVASC) 5 MG tablet [Pharmacy Med Name: AMLODIPINE BESYLATE 5 MG TAB] 90 tablet 2    Sig: Take 1 tablet (5 mg total) by mouth daily.     Cardiovascular: Calcium Channel Blockers 2 Failed - 09/14/2021  2:03 AM      Failed - Last BP in normal range    BP Readings from Last 1 Encounters:  06/07/21 140/82         Failed - Valid encounter within last 6 months    Recent Outpatient Visits           5 months ago Encounter for Medicare annual wellness exam   [place] Susy Frizzle, MD   9 months ago Left sided sciatica   [place] Dennard Schaumann Cammie Mcgee, MD   1 year ago Routine general medical examination at a health care facility   [place], Modena Nunnery, MD   2 years ago Essential hypertension   [place], Modena Nunnery, MD   3 years ago Encounter for Medicare annual wellness exam   [place] Delsa Grana, PA-C               Passed - Last Heart Rate in normal range    Pulse Readings from Last 1 Encounters:  06/07/21 74

## 2021-09-27 ENCOUNTER — Other Ambulatory Visit: Payer: Self-pay | Admitting: Family Medicine

## 2021-09-27 NOTE — Telephone Encounter (Signed)
Requested Prescriptions  Pending Prescriptions Disp Refills  . atorvastatin (LIPITOR) 80 MG tablet [Pharmacy Med Name: ATORVASTATIN 80 MG TABLET] 90 tablet 2    Sig: TAKE 1 TABLET BY MOUTH EVERY DAY     Cardiovascular:  Antilipid - Statins Failed - 09/27/2021  3:31 AM      Failed - Lipid Panel in normal range within the last 12 months    Cholesterol, Total  Date Value Ref Range Status  07/28/2020 129 100 - 199 mg/dL Final   Cholesterol  Date Value Ref Range Status  04/06/2021 143 <200 mg/dL Final   LDL Cholesterol (Calc)  Date Value Ref Range Status  04/06/2021 75 mg/dL (calc) Final    Comment:    Reference range: <100 . Desirable range <100 mg/dL for primary prevention;   <70 mg/dL for patients with CHD or diabetic patients  with > or = 2 CHD risk factors. Marland Kitchen LDL-C is now calculated using the Martin-Hopkins  calculation, which is a validated novel method providing  better accuracy than the Friedewald equation in the  estimation of LDL-C.  Cresenciano Genre et al. Annamaria Helling. 2993;716(96): 2061-2068  (http://education.[employer].com/faq/FAQ164)    HDL  Date Value Ref Range Status  04/06/2021 48 > OR = 40 mg/dL Final  07/28/2020 50 >39 mg/dL Final   Triglycerides  Date Value Ref Range Status  04/06/2021 117 <150 mg/dL Final         Passed - Patient is not pregnant      Passed - Valid encounter within last 12 months    Recent Outpatient Visits          5 months ago Encounter for Medicare annual wellness exam   [place] Susy Frizzle, MD   10 months ago Left sided sciatica   [place] Dennard Schaumann Cammie Mcgee, MD   1 year ago Routine general medical examination at a health care facility   [place], Modena Nunnery, MD   2 years ago Essential hypertension   [place], Modena Nunnery, MD   3 years ago Encounter for Medicare annual wellness exam   [place] Delsa Grana, PA-C

## 2021-09-28 ENCOUNTER — Telehealth: Payer: Medicare Other

## 2021-10-06 ENCOUNTER — Encounter: Payer: Self-pay | Admitting: Family Medicine

## 2021-10-06 ENCOUNTER — Ambulatory Visit (INDEPENDENT_AMBULATORY_CARE_PROVIDER_SITE_OTHER): Payer: Medicare Other | Admitting: Family Medicine

## 2021-10-06 VITALS — BP 126/78 | HR 116 | Ht 71.0 in | Wt 185.0 lb

## 2021-10-06 DIAGNOSIS — E782 Mixed hyperlipidemia: Secondary | ICD-10-CM

## 2021-10-06 DIAGNOSIS — I10 Essential (primary) hypertension: Secondary | ICD-10-CM

## 2021-10-06 DIAGNOSIS — I739 Peripheral vascular disease, unspecified: Secondary | ICD-10-CM

## 2021-10-06 MED ORDER — ALBUTEROL SULFATE HFA 108 (90 BASE) MCG/ACT IN AERS: 2.0000 | INHALATION_SPRAY | Freq: Four times a day (QID) | RESPIRATORY_TRACT | 0 refills | Status: DC | PRN

## 2021-10-06 NOTE — Progress Notes (Signed)
Subjective:    Patient ID: Jesus Klein, male    DOB: [birth year], 71 y.o.   MRN: [id number]  Medication Refill   Patient is a very pleasant 71 year old Caucasian gentleman who has a history of left-sided carotid artery disease and right-sided carotid artery disease.  However he had carotid Dopplers performed in 2022 that showed 1 to 39% internal carotid artery stenosis.  He has known peripheral artery disease.  Please see the past medical history.  This included high-grade ostial left common carotid artery stenosis that required a stent.  He also had right common iliac and external iliac stenting.  He is currently on Plavix.  He denies any chest pain shortness of breath or dyspnea on exertion.  His blood pressure is well controlled at 126/78.  He is taking his statin medication.  He denies any right upper quadrant pain or myalgias. Immunization History  Administered Date(s) Administered   Fluad Quad(high Dose 65+) 12/23/2018, 02/05/2020   Influenza,inj,Quad PF,6+ Mos 02/02/2014   Influenza-Unspecified 03/03/2018, 01/16/2021   PFIZER(Purple Top)SARS-COV-2 Vaccination 06/16/2019, 07/07/2019, 03/01/2020, 01/16/2021   Pneumococcal Conjugate-13 04/23/2016   Pneumococcal Polysaccharide-23 04/23/2017   Tdap 09/02/2010   Zoster, Live 11/07/2015    Past Medical History:  Diagnosis Date   Allergy    seasonal allergies   Arthritis    arms/shoulders   Blood transfusion without reported diagnosis 2009   with hip replacement   History of tobacco abuse    Hyperlipidemia    on meds   Hypertension 2010   on meds   Left-sided carotid artery disease ([place])    high-grade ostial left common carotid artery stenosis   Peripheral arterial disease ([place])    a. 02/2014 - right common iliac and external iliac intervention. b. H/o PTA/stenting of left common carotid artery stenosis. Known 80% left subclavian artery stenosis without UE claudication/steal sx.   Subclavian artery stenosis, left [facility])    Past  Surgical History:  Procedure Laterality Date   CAROTID STENT INSERTION N/A 04/28/2014   Procedure: CAROTID STENT INSERTION;  Surgeon: Serafina Mitchell, MD;  Location: [facility] CATH LAB;  Service: Cardiovascular;  Laterality: N/A;   ILIAC ARTERY STENT Right 03/04/2014   w/as well as right external iliac and common femoral artery stenting/notes 03/04/2014   JOINT REPLACEMENT     LOWER EXTREMITY ANGIOGRAM N/A 03/04/2014   Procedure: LOWER EXTREMITY ANGIOGRAM;  Surgeon: Lorretta Harp, MD;  Location[facility] C[facility] LAB;  Service: Cardiovascular;  Laterality: N/A;   TOTAL HIP ARTHROPLASTY Right 10/2005   WISDOM TOOTH EXTRACTION     Current Outpatient Medications on File Prior to Visit  Medication Sig Dispense Refill   amLODipine (NORVASC) 5 MG tablet TAKE 1 TABLET (5 MG TOTAL) BY MOUTH DAILY. 30 tablet 0   atorvastatin (LIPITOR) 80 MG tablet TAKE 1 TABLET BY MOUTH EVERY DAY 90 tablet 2   cetirizine (ZYRTEC) 10 MG tablet Take 10 mg by mouth daily as needed.      Cholecalciferol (VITAMIN D) 125 MCG (5000 UT) CAPS Take 5,000 Units by mouth daily.      clopidogrel (PLAVIX) 75 MG tablet TAKE 1 TABLET BY MOUTH EVERY DAY 90 tablet 3   Coenzyme Q10 300 MG CAPS Take 300 mg by mouth daily.      ezetimibe (ZETIA) 10 MG tablet TAKE 1 TABLET BY MOUTH EVERY DAY 90 tablet 3   latanoprost (XALATAN) 0.005 % ophthalmic solution 1 drop at bedtime.     Multiple Vitamin (MULTIVITAMIN WITH MINERALS) TABS tablet Take  1 tablet by mouth daily.     polyvinyl alcohol (LIQUIFILM TEARS) 1.4 % ophthalmic solution Place 1 drop into both eyes daily as needed for dry eyes (allergies).     predniSONE (DELTASONE) 20 MG tablet 3 tabs poqday 1-2, 2 tabs poqday 3-4, 1 tab poqday 5-6 12 tablet 0   No current facility-administered medications on file prior to visit.   No Known Allergies Social History   Socioeconomic History   Marital status: Married    Spouse name: Jesus Klein   Number of children: 2   Years of education: Not on file   Highest  education level: Not on file  Occupational History   Not on file  Tobacco Use   Smoking status: Former    Packs/day: 1.50    Years: 30.00    Total pack years: 45.00    Types: Cigarettes    Quit date: 01/19/2009    Years since quitting: 12.7   Smokeless tobacco: Never  Vaping Use   Vaping Use: Never used  Substance and Sexual Activity   Alcohol use: Yes    Alcohol/week: 2.0 standard drinks of alcohol    Types: 2 Cans of beer per week   Drug use: No   Sexual activity: Yes    Birth control/protection: None  Other Topics Concern   Not on file  Social History Narrative   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 He may occasionally lift 20 pounds but most is done by machine.    Mix of standing, sitting, moving around.      Used to walk a lot and do a lot outside. Got a treadmill to use during cold weather.   Married.   2 daughters- grown.         Social Determinants of Health   Financial Resource Strain: Low Risk  (02/23/2021)   Overall Financial Resource Strain (CARDIA)    Difficulty of Paying Living Expenses: Not hard at all  Food Insecurity: No Food Insecurity (02/23/2021)   Hunger Vital Sign    Worried About Running Out of Food in the Last Year: Never true    Ran Out of Food in the Last Year: Never true  Transportation Needs: No Transportation Needs (02/23/2021)   PRAPARE - Hydrologist (Medical): No    Lack of Transportation (Non-Medical): No  Physical Activity: Sufficiently Active (02/23/2021)   Exercise Vital Sign    Days of Exercise per Week: 5 days    Minutes of Exercise per Session: 60 min  Stress: No Stress Concern Present (02/23/2021)   [place]    Feeling of Stress : Not at all  Social Connections: [place] (02/23/2021)   Social Connection and Isolation Panel [NHANES]    Frequency of Communication with  Friends and Family: More than three times a week    Frequency of Social Gatherings with Friends and Family: More than three times a week    Attends Religious Services: More than 4 times per year    Active Member of [employer] or Organizations: Yes    Attends Archivist Meetings: More than 4 times per year    Marital Status: Married  Human resources officer Violence: Not At Risk (02/23/2021)   Humiliation, Afraid, Rape, and Kick questionnaire    Fear of Current or Ex-Partner: No    Emotionally Abused: No    Physically Abused: No  Sexually Abused: No     Review of Systems  All other systems reviewed and are negative.      Objective:   Physical Exam Vitals reviewed.  Constitutional:      General: He is not in acute distress.    Appearance: Normal appearance. He is normal weight. He is not ill-appearing, toxic-appearing or diaphoretic.  Neck:     Vascular: Carotid bruit present.  Cardiovascular:     Rate and Rhythm: Normal rate and regular rhythm.     Pulses: Normal pulses.     Heart sounds: Normal heart sounds.     No friction rub. No gallop.  Pulmonary:     Effort: Pulmonary effort is normal. No respiratory distress.     Breath sounds: No stridor. Wheezing present. No rhonchi or rales.  Chest:     Chest wall: No tenderness.  Abdominal:     General: Abdomen is flat. Bowel sounds are normal. There is no distension.     Palpations: Abdomen is soft. There is no mass.     Tenderness: There is no abdominal tenderness. There is no right CVA tenderness, left CVA tenderness, guarding or rebound.     Hernia: No hernia is present.  Musculoskeletal:     Right shoulder: No bony tenderness. Decreased range of motion. Normal strength.     Left shoulder: No bony tenderness. Decreased range of motion. Normal strength.     Cervical back: Normal range of motion and neck supple. No rigidity or tenderness.     Right lower leg: No edema.     Left lower leg: No edema.  Lymphadenopathy:      Cervical: No cervical adenopathy.  Skin:    Findings: No lesion or rash.  Neurological:     General: No focal deficit present.     Mental Status: He is alert and oriented to person, place, and time. Mental status is at baseline.     Cranial Nerves: No cranial nerve deficit.     Motor: No weakness.     Gait: Gait normal.  Psychiatric:        Mood and Affect: Mood normal.        Behavior: Behavior normal.        Thought Content: Thought content normal.        Judgment: Judgment normal.           Assessment & Plan:   PAD (peripheral artery disease) (HCC) - Plan: CBC with Differential/Platelet, Lipid panel, COMPLETE METABOLIC PANEL WITH GFR  Essential hypertension  Mixed hyperlipidemia Patient's blood pressure is outstanding.  Check CBC CMP and a fasting lipid panel.  Ideally would like his LDL cholesterol to be below 70.  The remainder of his preventative care is up-to-date.

## 2021-10-07 LAB — COMPLETE METABOLIC PANEL WITH GFR
AG Ratio: 2.1 (calc) (ref 1.0–2.5)
ALT: 24 U/L (ref 9–46)
AST: 23 U/L (ref 10–35)
Albumin: 4.6 g/dL (ref 3.6–5.1)
Alkaline phosphatase (APISO): 66 U/L (ref 35–144)
BUN: 16 mg/dL (ref 7–25)
CO2: 28 mmol/L (ref 20–32)
Calcium: 9.5 mg/dL (ref 8.6–10.3)
Chloride: 106 mmol/L (ref 98–110)
Creat: 0.84 mg/dL (ref 0.70–1.35)
Globulin: 2.2 g/dL (calc) (ref 1.9–3.7)
Glucose, Bld: 91 mg/dL (ref 65–99)
Potassium: 4.5 mmol/L (ref 3.5–5.3)
Sodium: 140 mmol/L (ref 135–146)
Total Bilirubin: 0.5 mg/dL (ref 0.2–1.2)
Total Protein: 6.8 g/dL (ref 6.1–8.1)
eGFR: 94 mL/min/{1.73_m2} (ref >=60)

## 2021-10-07 LAB — CBC WITH DIFFERENTIAL/PLATELET
Absolute Monocytes: 502 cells/uL (ref 200–950)
Basophils Absolute: 32 cells/uL (ref 0–200)
Basophils Relative: 0.6 %
Eosinophils Absolute: 162 cells/uL (ref 15–500)
Eosinophils Relative: 3 %
HCT: 47.7 % (ref 38.5–50.0)
Hemoglobin: 16.2 g/dL (ref 13.2–17.1)
Lymphs Abs: 2171 cells/uL (ref 850–3900)
MCH: 29.7 pg (ref 27.0–33.0)
MCHC: 34 g/dL (ref 32.0–36.0)
MCV: 87.4 fL (ref 80.0–100.0)
MPV: 10 fL (ref 7.5–12.5)
Monocytes Relative: 9.3 %
Neutro Abs: 2533 cells/uL (ref 1500–7800)
Neutrophils Relative %: 46.9 %
Platelets: 300 10*3/uL (ref 140–400)
RBC: 5.46 10*6/uL (ref 4.20–5.80)
RDW: 12.4 % (ref 11.0–15.0)
Total Lymphocyte: 40.2 %
WBC: 5.4 10*3/uL (ref 3.8–10.8)

## 2021-10-07 LAB — LIPID PANEL
Cholesterol: 129 mg/dL (ref <200)
HDL: 50 mg/dL (ref >=40)
LDL Cholesterol (Calc): 61 mg/dL (calc)
Non-HDL Cholesterol (Calc): 79 mg/dL (calc) (ref <130)
Total CHOL/HDL Ratio: 2.6 (calc) (ref <5.0)
Triglycerides: 96 mg/dL (ref <150)

## 2021-10-11 ENCOUNTER — Other Ambulatory Visit: Payer: Self-pay | Admitting: Family Medicine

## 2021-11-03 ENCOUNTER — Other Ambulatory Visit: Payer: Self-pay | Admitting: Family Medicine

## 2021-11-03 NOTE — Telephone Encounter (Signed)
Requested Prescriptions  Pending Prescriptions Disp Refills  . amLODipine (NORVASC) 5 MG tablet [Pharmacy Med Name: AMLODIPINE BESYLATE 5 MG TAB] 30 tablet 0    Sig: TAKE 1 TABLET (5 MG TOTAL) BY MOUTH DAILY.     Cardiovascular: Calcium Channel Blockers 2 Failed - 11/03/2021  9:35 AM      Failed - Last Heart Rate in normal range    Pulse Readings from Last 1 Encounters:  10/06/21 (!) 116         Failed - Valid encounter within last 6 months    Recent Outpatient Visits          7 months ago Encounter for [employer] annual wellness exam   [place] Dennard Schaumann, Cammie Mcgee, MD   11 months ago Left sided sciatica   [place] Dennard Schaumann Cammie Mcgee, MD   1 year ago Routine general medical examination at a health care facility   [place], Modena Nunnery, MD   2 years ago Essential hypertension   [place], Modena Nunnery, MD   3 years ago Encounter for Medicare annual wellness exam   [place] Delsa Grana, PA-C             Passed - Last BP in normal range    BP Readings from Last 1 Encounters:  10/06/21 126/78

## 2021-12-14 ENCOUNTER — Ambulatory Visit
Admission: EM | Admit: 2021-12-14 | Discharge: 2021-12-14 | Disposition: A | Discharge status: Home / Self Care | Payer: Medicare Other | Attending: Emergency Medicine | Admitting: Emergency Medicine

## 2021-12-14 DIAGNOSIS — J069 Acute upper respiratory infection, unspecified: Secondary | ICD-10-CM | POA: Diagnosis not present

## 2021-12-14 DIAGNOSIS — H6121 Impacted cerumen, right ear: Secondary | ICD-10-CM

## 2021-12-14 DIAGNOSIS — U071 COVID-19: Secondary | ICD-10-CM | POA: Insufficient documentation

## 2021-12-14 LAB — SARS CORONAVIRUS 2 BY RT PCR: SARS Coronavirus 2 by RT PCR: POSITIVE — AB

## 2021-12-14 MED ORDER — PROMETHAZINE-DM 6.25-15 MG/5ML PO SYRP: 5.0000 mL | ORAL_SOLUTION | Freq: Four times a day (QID) | ORAL | 0 refills | Status: DC | PRN

## 2021-12-14 MED ORDER — BENZONATATE 100 MG PO CAPS: 100.0000 mg | ORAL_CAPSULE | Freq: Three times a day (TID) | ORAL | 0 refills | Status: DC

## 2021-12-14 MED ORDER — PREDNISONE 20 MG PO TABS: 40.0000 mg | ORAL_TABLET | Freq: Every day | ORAL | 0 refills | Status: DC

## 2021-12-14 NOTE — Discharge Instructions (Addendum)
Your symptoms today are most likely being caused by a virus and should steadily improve in time it can take up to 7 to 10 days before you truly start to see a turnaround however things will get better  On Exam your right ear was impacted with wax and we have attempted irrigation with water to help clean  Test is pending, you will be notified of positive test results only, based on your medical history of positive you will qualify for antiviral treatment which will ideally minimize your symptoms and the timeline of that you are sick, medication will be sent in at time of notification  If positive you will need to quarantine for 5 days per current CDC guidelines meaning you may return to normal activity on Monday, December 18, 2021  Given use of prednisone every morning with food for the next 5 days to reduce inflammation and help minimize for wheezing  You may use Tessalon pill every 8 hours to help calm your coughing  You may use cough syrup for additional comfort, be mindful this medication may make you drowsy  Attempt any of the following below in addition    You can take Tylenol and/or Ibuprofen as needed for fever reduction and pain relief.   For cough: honey 1/2 to 1 teaspoon (you can dilute the honey in water or another fluid).  You can also use guaifenesin for cough. You can use a humidifier for chest congestion and cough.  If you don't have a humidifier, you can sit in the bathroom with the hot shower running.      For sore throat: try warm salt water gargles, cepacol lozenges, throat spray, warm tea or water with lemon/honey, popsicles or ice, or OTC cold relief medicine for throat discomfort.   For congestion: take a daily anti-histamine like Zyrtec, Claritin, and a oral decongestant, such as pseudoephedrine.  You can also use Flonase 1-2 sprays in each nostril daily.   It is important to stay hydrated: drink plenty of fluids (water, gatorade/powerade/pedialyte, juices, or teas) to  keep your throat moisturized and help further relieve irritation/discomfort.

## 2021-12-14 NOTE — ED Provider Notes (Signed)
Jesus Klein    CSN: [id number] Arrival date & time: 12/14/21  1139      History   Chief Complaint Chief Complaint  Patient presents with   Nasal Congestion   Cough    HPI Jesus Klein is a 71 y.o. male.   Patient presents with nasal congestion, rhinorrhea, sore throat, right-sided ear pain, nonproductive cough with clear sputum, intermittent wheezing that began 3 days ago.  Requesting COVID testing, no known sick contacts.  Has attempted use of over-the-counter medications which have been ineffective.  Tolerating food and liquids.  History of hypertension, hyperlipidemia PVD with stent placements.  Past Medical History:  Diagnosis Date   Allergy    seasonal allergies   Arthritis    arms/shoulders   Blood transfusion without reported diagnosis 2009   with hip replacement   History of tobacco abuse    Hyperlipidemia    on meds   Hypertension 2010   on meds   Left-sided carotid artery disease ([place])    high-grade ostial left common carotid artery stenosis   Peripheral arterial disease ([place])    a. 02/2014 - right common iliac and external iliac intervention. b. H/o PTA/stenting of left common carotid artery stenosis. Known 80% left subclavian artery stenosis without UE claudication/steal sx.   Subclavian artery stenosis, left [facility])     Patient Active Problem List   Diagnosis Date Noted   Subclavian artery stenosis, left ([place]) 05/29/2017   Prostate cancer screening 10/07/2014   History of tobacco abuse    Left-sided carotid artery disease s/p LCCA PTA/Stent 04/28/14 04/28/2014   PVD - Carotid, subclavian, bilat LE disease 03/05/2014   S/P RCIA, RCFA, REIA PTA 03/04/14 03/05/2014   Essential hypertension 02/02/2014   Vitamin D deficiency 02/02/2014   Hyperlipidemia 02/02/2014   Claudication of lower extremity ([place]) 02/02/2014   Bilateral carotid bruits 02/02/2014    Past Surgical History:  Procedure Laterality Date   CAROTID STENT INSERTION N/A 04/28/2014    Procedure: CAROTID STENT INSERTION;  Surgeon: Serafina Mitchell, MD;  Location: [facility] CATH LAB;  Service: Cardiovascular;  Laterality: N/A;   ILIAC ARTERY STENT Right 03/04/2014   w/as well as right external iliac and common femoral artery stenting/notes 03/04/2014   JOINT REPLACEMENT     LOWER EXTREMITY ANGIOGRAM N/A 03/04/2014   Procedure: LOWER EXTREMITY ANGIOGRAM;  Surgeon: Lorretta Harp, MD;  Location: [facility] CATH LAB;  Service: Cardiovascular;  Laterality: N/A;   TOTAL HIP ARTHROPLASTY Right 10/2005   WISDOM TOOTH EXTRACTION         Home Medications    Prior to Admission medications   Medication Sig Start Date End Date Taking? Authorizing Provider  benzonatate (TESSALON) 100 MG capsule Take 1 capsule (100 mg total) by mouth every 8 (eight) hours. 12/14/21  Yes Dezaria Methot R, NP  predniSONE (DELTASONE) 20 MG tablet Take 2 tablets (40 mg total) by mouth daily. 12/14/21  Yes Athalee Esterline R, NP  promethazine-dextromethorphan (PROMETHAZINE-DM) 6.25-15 MG/5ML syrup Take 5 mLs by mouth 4 (four) times daily as needed for cough. 12/14/21  Yes Gracy Ehly R, NP  albuterol (VENTOLIN HFA) 108 (90 Base) MCG/ACT inhaler Inhale 2 puffs into the lungs every 6 (six) hours as needed for wheezing or shortness of breath. 10/06/21   Susy Frizzle, MD  amLODipine (NORVASC) 5 MG tablet TAKE 1 TABLET (5 MG TOTAL) BY MOUTH DAILY. 11/03/21   Susy Frizzle, MD  atorvastatin (LIPITOR) 80 MG tablet TAKE 1 TABLET BY MOUTH EVERY  DAY 09/27/21   Susy Frizzle, MD  cetirizine (ZYRTEC) 10 MG tablet Take 10 mg by mouth daily as needed.     [provider]  Cholecalciferol (VITAMIN D) 125 MCG (5000 UT) CAPS Take 5,000 Units by mouth daily.     [provider]  clopidogrel (PLAVIX) 75 MG tablet TAKE 1 TABLET BY MOUTH EVERY DAY 06/14/21   Susy Frizzle, MD  Coenzyme Q10 300 MG CAPS Take 300 mg by mouth daily.     [provider]  ezetimibe (ZETIA) 10 MG tablet TAKE 1 TABLET BY MOUTH  EVERY DAY 05/08/21   Lorretta Harp, MD  latanoprost (XALATAN) 0.005 % ophthalmic solution 1 drop at bedtime.    [provider]  Multiple Vitamin (MULTIVITAMIN WITH MINERALS) TABS tablet Take 1 tablet by mouth daily.    [provider]  polyvinyl alcohol (LIQUIFILM TEARS) 1.4 % ophthalmic solution Place 1 drop into both eyes daily as needed for dry eyes (allergies).    [provider]    Family History Family History  Problem Relation Age of Onset   Hyperlipidemia Mother    Colitis Mother    Diabetes Mother    Lung cancer Mother 74   Liver cancer Mother 10       mets from lung   Brain cancer Mother 57       mets from lung   Kidney cancer Mother 59       mets from 59   Diverticulitis Mother    Arthritis Father    COPD Father    Diabetes Father    Hypertension Father    Colon cancer Neg Hx    Stomach cancer Neg Hx    Colon polyps Neg Hx    Esophageal cancer Neg Hx    Rectal cancer Neg Hx     Social History Social History   Tobacco Use   Smoking status: Former    Packs/day: 1.50    Years: 30.00    Total pack years: 45.00    Types: Cigarettes    Quit date: 01/19/2009    Years since quitting: 12.9   Smokeless tobacco: Never  Vaping Use   Vaping Use: Never used  Substance Use Topics   Alcohol use: Yes    Alcohol/week: 2.0 standard drinks of alcohol    Types: 2 Cans of beer per week   Drug use: No     Allergies   Patient has no known allergies.   Review of Systems Review of Systems Defer to HPI   Physical Exam Triage Vital Signs ED Triage Vitals  Enc Vitals Group     BP 12/14/21 1207 (!) 145/93     Pulse Rate 12/14/21 1207 88     Resp 12/14/21 1207 20     Temp 12/14/21 1207 97.9 F (36.6 C)     Temp src --      SpO2 12/14/21 1207 93 %     Weight --      Height --      Head Circumference --      Peak Flow --      Pain Score 12/14/21 1206 0     Pain Loc --      Pain Edu? --      Excl. in [place]? --    No data  found.  Updated Vital Signs BP (!) 145/93   Pulse 88   Temp 97.9 F (36.6 C)   Resp 20   SpO2 93%  Visual Acuity Right Eye Distance:   Left Eye Distance:   Bilateral Distance:    Right Eye Near:   Left Eye Near:    Bilateral Near:     Physical Exam Constitutional:      Appearance: Normal appearance.  HENT:     Head: Normocephalic.     Right Ear: Ear canal and external ear normal. There is impacted cerumen.     Left Ear: Tympanic membrane, ear canal and external ear normal.     Nose: Congestion present. No rhinorrhea.     Mouth/Throat:     Mouth: Mucous membranes are moist.     Pharynx: Oropharynx is clear. Posterior oropharyngeal erythema present.     Tonsils: No tonsillar exudate. 0 on the right. 0 on the left.  Eyes:     Extraocular Movements: Extraocular movements intact.  Cardiovascular:     Rate and Rhythm: Normal rate and regular rhythm.     Pulses: Normal pulses.     Heart sounds: Normal heart sounds.  Pulmonary:     Effort: Pulmonary effort is normal.     Comments: Wheezing heard to the left lower lobe otherwise clear to auscultation Skin:    General: Skin is warm and dry.  Neurological:     Mental Status: He is alert and oriented to person, place, and time. Mental status is at baseline.  Psychiatric:        Mood and Affect: Mood normal.        Behavior: Behavior normal.      UC Treatments / Results  Labs (all labs ordered are listed, but only abnormal results are displayed) Labs Reviewed  SARS CORONAVIRUS 2 BY RT PCR    EKG   Radiology No results found.  Procedures Procedures (including critical care time)  Medications Ordered in UC Medications - No data to display  Initial Impression / Assessment and Plan / UC Course  I have reviewed the triage vital signs and the nursing notes.  Pertinent labs & imaging results that were available during my care of the patient were reviewed by me and considered in my medical decision making (see  chart for details).  Viral URI with cough, impacted cerumen of right ear  Etiology is most likely viral as symptoms have been present for only 3 days, COVID test is pending, qualifies for antiviral positive, discussed administration as well as quarantine guidelines, wheezing is heard to the left lower lobe O2 saturation 93%, prednisone, Tessalon and Promethazine DM prescribed for outpatient use is because this most worrisome symptom today, may use additional over-the-counter medications as needed, impaction noted to the right ear canal on exam, attempted irrigation but was unsuccessful, patient endorses that he has Debrox drops at home, recommended use with follow-up as needed Final Clinical Impressions(s) / UC Diagnoses   Final diagnoses:  Impacted cerumen of right ear  Viral URI with cough     Discharge Instructions      Your symptoms today are most likely being caused by a virus and should steadily improve in time it can take up to 7 to 10 days before you truly start to see a turnaround however things will get better  On Exam your right ear was impacted with wax and we have attempted irrigation with water to help clean  Test is pending, you will be notified of positive test results only, based on your medical history of positive you will qualify for antiviral treatment which will ideally minimize your symptoms and the timeline  of that you are sick, medication will be sent in at time of notification  If positive you will need to quarantine for 5 days per current CDC guidelines meaning you may return to normal activity on Monday, December 18, 2021  Given use of prednisone every morning with food for the next 5 days to reduce inflammation and help minimize for wheezing  You may use Tessalon pill every 8 hours to help calm your coughing  You may use cough syrup for additional comfort, be mindful this medication may make you drowsy  Attempt any of the following below in addition    You  can take Tylenol and/or Ibuprofen as needed for fever reduction and pain relief.   For cough: honey 1/2 to 1 teaspoon (you can dilute the honey in water or another fluid).  You can also use guaifenesin for cough. You can use a humidifier for chest congestion and cough.  If you don't have a humidifier, you can sit in the bathroom with the hot shower running.      For sore throat: try warm salt water gargles, cepacol lozenges, throat spray, warm tea or water with lemon/honey, popsicles or ice, or OTC cold relief medicine for throat discomfort.   For congestion: take a daily anti-histamine like Zyrtec, Claritin, and a oral decongestant, such as pseudoephedrine.  You can also use Flonase 1-2 sprays in each nostril daily.   It is important to stay hydrated: drink plenty of fluids (water, gatorade/powerade/pedialyte, juices, or teas) to keep your throat moisturized and help further relieve irritation/discomfort.    ED Prescriptions     Medication Sig Dispense Auth. Provider   predniSONE (DELTASONE) 20 MG tablet Take 2 tablets (40 mg total) by mouth daily. 10 tablet Kristl Morioka R, NP   benzonatate (TESSALON) 100 MG capsule Take 1 capsule (100 mg total) by mouth every 8 (eight) hours. 21 capsule Iman Orourke R, NP   promethazine-dextromethorphan (PROMETHAZINE-DM) 6.25-15 MG/5ML syrup Take 5 mLs by mouth 4 (four) times daily as needed for cough. 118 mL Taliesin Hartlage, Leitha Schuller, NP      PDMP not reviewed this encounter.   Hans [place], [place] 12/14/21 1337

## 2021-12-14 NOTE — ED Triage Notes (Signed)
Pt presents wit c/o nasal congestion and cough that began on Monday, pt concerned for bronchitis and covid

## 2021-12-15 ENCOUNTER — Other Ambulatory Visit: Payer: Self-pay | Admitting: Family Medicine

## 2021-12-15 ENCOUNTER — Telehealth (HOSPITAL_COMMUNITY): Payer: Self-pay | Admitting: Internal Medicine

## 2021-12-15 ENCOUNTER — Telehealth: Payer: Self-pay

## 2021-12-15 MED ORDER — NIRMATRELVIR/RITONAVIR (PAXLOVID)TABLET
3.0000 | ORAL_TABLET | Freq: Two times a day (BID) | ORAL | 0 refills | Status: DC
Start: 2021-12-15 — End: 2021-12-15

## 2021-12-15 MED ORDER — MOLNUPIRAVIR 200 MG PO CAPS
4.0000 | ORAL_CAPSULE | Freq: Two times a day (BID) | ORAL | 0 refills | Status: DC
Start: 2021-12-15 — End: 2021-12-15

## 2021-12-15 MED ORDER — MOLNUPIRAVIR 200 MG PO CAPS: 4.0000 | ORAL_CAPSULE | Freq: Two times a day (BID) | ORAL | 0 refills | Status: AC

## 2021-12-15 NOTE — Telephone Encounter (Addendum)
Pt called this morning, stating  that pt went to [facility] for a COVID test. Per pt test came back 'positive'   Pt would like Rx sent in for COVID. Please advice  [id number] [address] [place] [address]

## 2021-12-15 NOTE — Telephone Encounter (Signed)
Paxlovid was initially sent but cancelled the prescription because of interaction with Plavix. Pharmacy was informed about the cancellation of Paxlovid prescription.  Molnupirivar has been sent to the pharmacy.

## 2021-12-18 NOTE — Telephone Encounter (Signed)
Pt aware of abx sent to pharmacy, already picked up, per pt it seems to be helping. Only 1 day and half left per pt.   Told pt to call should he have any other questions. Pt voiced understanding, nothing further.

## 2022-01-06 IMAGING — CR DG LUMBAR SPINE COMPLETE 4+V
1 series · 5 of 5 positions shown · non-contrast
Comparison: None.

CLINICAL DATA: Lower back and posterior left hip pain for 8 weeks

EXAM:
LUMBAR SPINE - COMPLETE 4+ VIEW

[Series 1: dg lumbar spine complete 4 +v · 0.14mm/px · 5 of 5 slices shown]
[im 1/5]
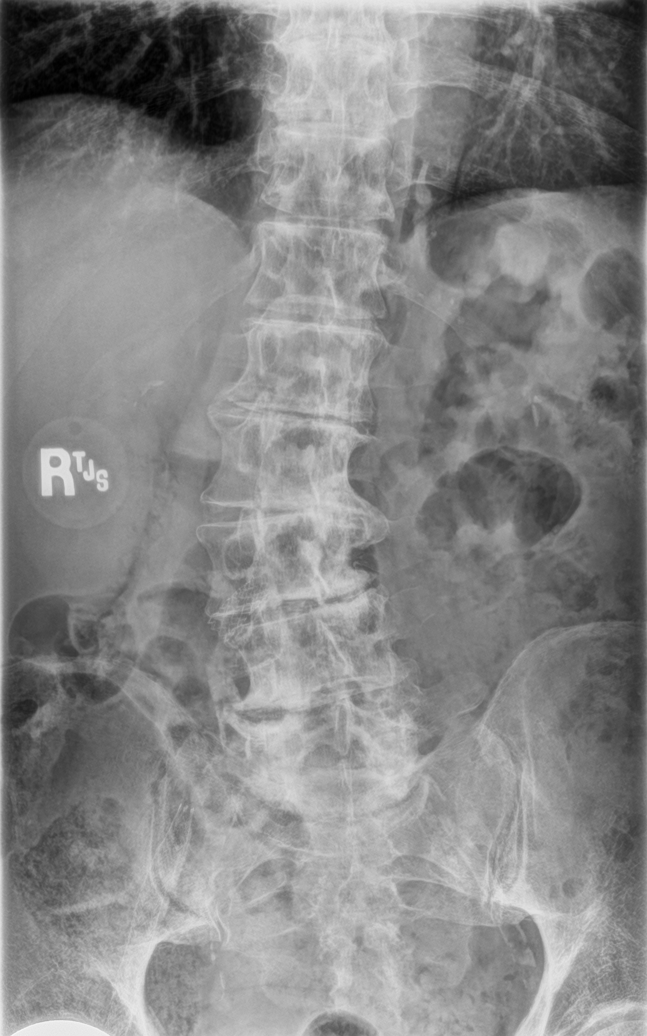
[im 2/5]
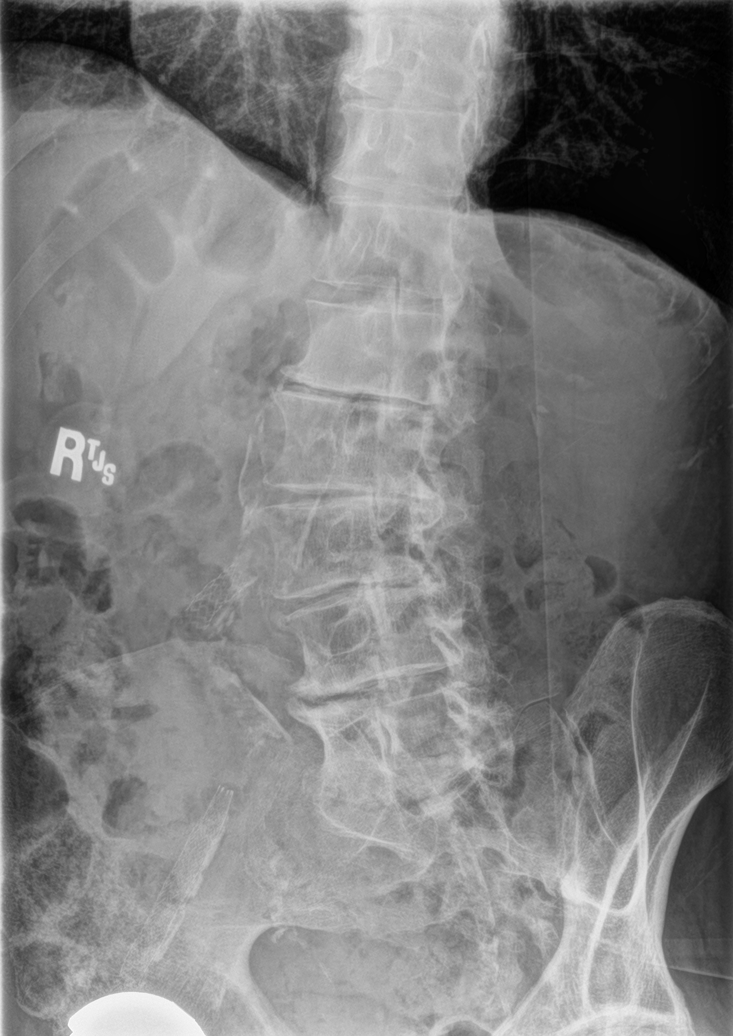
[im 3/5]
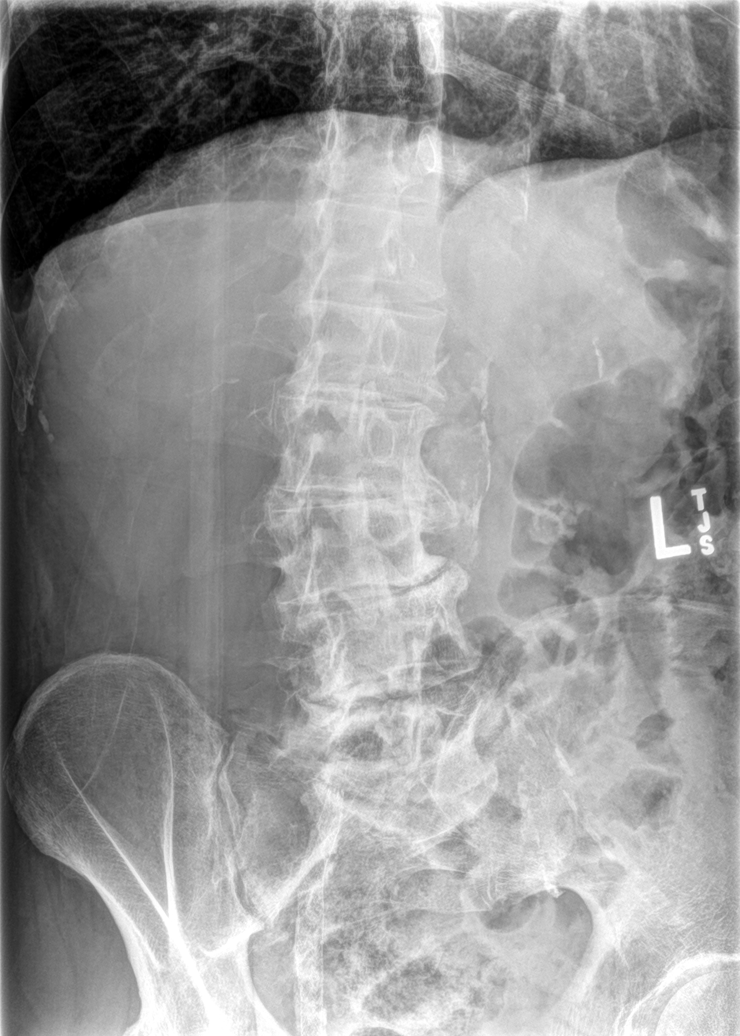
[im 4/5]
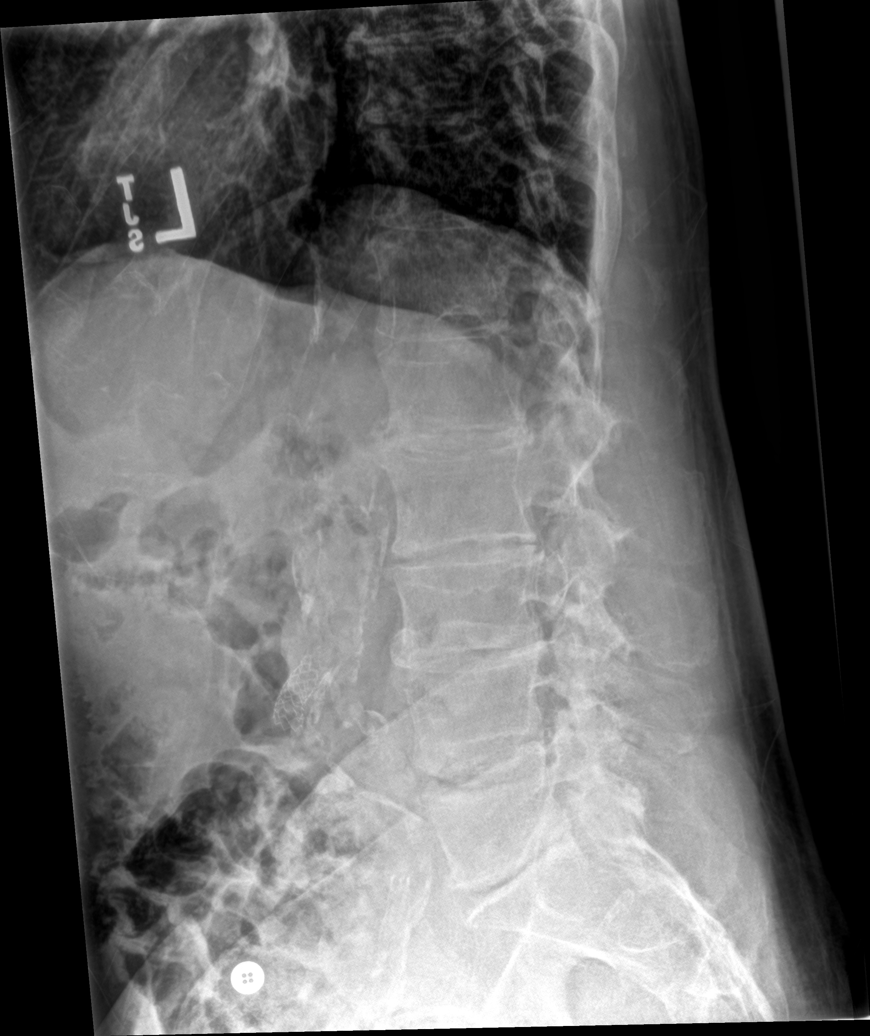
[im 5/5]
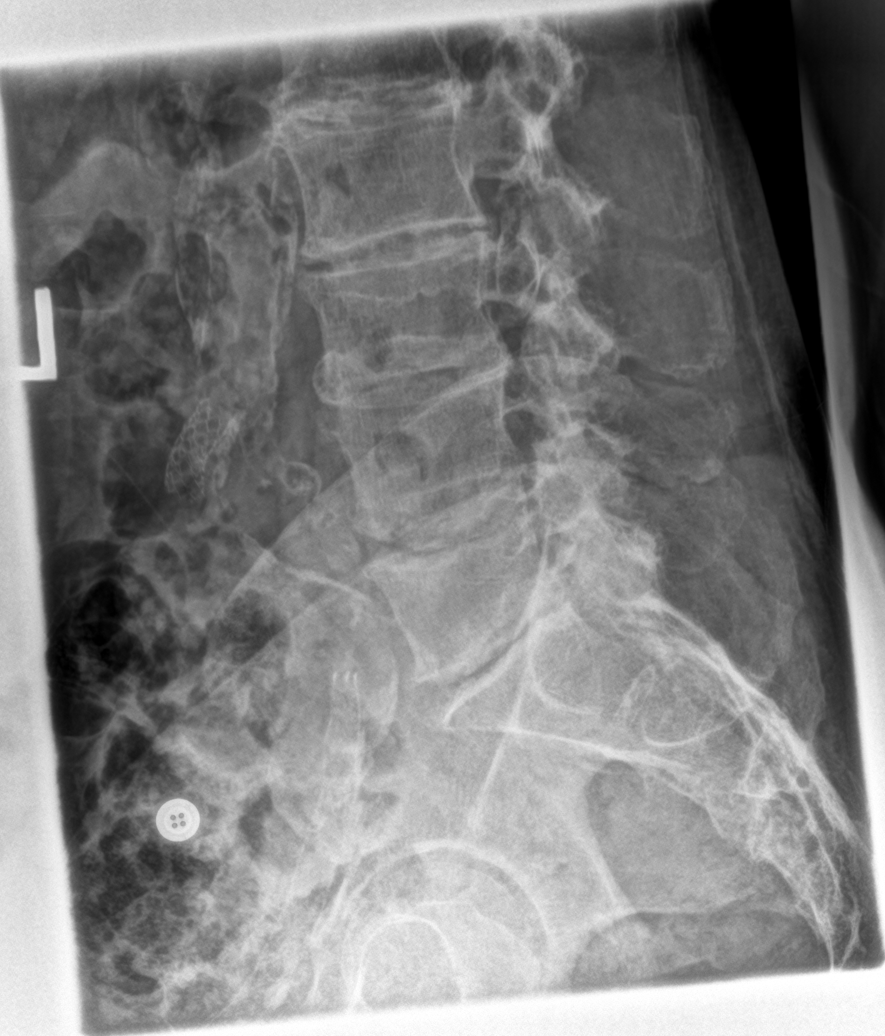

[5 of 5 positions shown; findings below may reference images not displayed]

FINDINGS: Frontal, bilateral oblique, and lateral views of the lumbar spine
are obtained. There are 5 non-rib-bearing lumbar type vertebral
bodies identified, with right convex scoliosis centered at L2-3.
Otherwise alignment is anatomic. There are no acute displaced
fractures. Diffuse multilevel spondylosis and facet hypertrophy is
seen throughout the lumbar spine, greatest at the lumbosacral
junction. Sacroiliac joints are normal. Diffuse vascular
calcifications are seen throughout the aorta.
IMPRESSION: 1. Extensive multilevel lumbar spondylosis and facet hypertrophy.
2. Right convex scoliosis.
3. No acute fracture.

## 2022-01-09 ENCOUNTER — Ambulatory Visit (INDEPENDENT_AMBULATORY_CARE_PROVIDER_SITE_OTHER): Payer: Medicare Other | Admitting: Family Medicine

## 2022-01-09 VITALS — BP 132/82 | HR 82 | Temp 99.5°F | Ht 71.0 in | Wt 190.2 lb

## 2022-01-09 DIAGNOSIS — H9113 Presbycusis, bilateral: Secondary | ICD-10-CM | POA: Diagnosis not present

## 2022-01-09 NOTE — Progress Notes (Signed)
Subjective:    Patient ID: Jesus Klein, male    DOB: Aug 15, 1950, 71 y.o.   MRN: [id number] Patient recently went to a local urgent care due to bilateral hearing loss right greater than left.  He was diagnosed with cerumen impactions.  They were unable to remove the cerumen impactions with irrigation and lavage and recommended that he try Debrox drops.  He has been doing that over the last few days and is here today for recheck.  He has a history of high-frequency hearing loss in his ears due to chronic noise damage.  However this is usually mild.  His only problems are occasionally muffled voices and discerning conversation in crowds.  He states that his hearing is back to his baseline.  On examination today, both auditory canals are essentially clear there is only a small residual quantity of wax remaining.  There is no damage or abnormality seen in the tympanic membrane   Past Medical History:  Diagnosis Date   Allergy    seasonal allergies   Arthritis    arms/shoulders   Blood transfusion without reported diagnosis 2009   with hip replacement   History of tobacco abuse    Hyperlipidemia    on meds   Hypertension 2010   on meds   Left-sided carotid artery disease ([place])    high-grade ostial left common carotid artery stenosis   Peripheral arterial disease ([place])    a. 02/2014 - right common iliac and external iliac intervention. b. H/o PTA/stenting of left common carotid artery stenosis. Known 80% left subclavian artery stenosis without UE claudication/steal sx.   Subclavian artery stenosis, left [facility])    Past Surgical History:  Procedure Laterality Date   CAROTID STENT INSERTION N/A 04/28/2014   Procedure: CAROTID STENT INSERTION;  Surgeon: Serafina Mitchell, MD;  Location: [facility] CATH LAB;  Service: Cardiovascular;  Laterality: N/A;   ILIAC ARTERY STENT Right 03/04/2014   w/as well as right external iliac and common femoral artery stenting/notes 03/04/2014   JOINT REPLACEMENT     LOWER  EXTREMITY ANGIOGRAM N/A 03/04/2014   Procedure: LOWER EXTREMITY ANGIOGRAM;  Surgeon: Lorretta Harp, MD;  Location: [facility] CATH LAB;  Service: Cardiovascular;  Laterality: N/A;   TOTAL HIP ARTHROPLASTY Right 10/2005   WISDOM TOOTH EXTRACTION     Current Outpatient Medications on File Prior to Visit  Medication Sig Dispense Refill   amLODipine (NORVASC) 5 MG tablet TAKE 1 TABLET (5 MG TOTAL) BY MOUTH DAILY. 90 tablet 1   atorvastatin (LIPITOR) 80 MG tablet TAKE 1 TABLET BY MOUTH EVERY DAY 90 tablet 2   cetirizine (ZYRTEC) 10 MG tablet Take 10 mg by mouth daily as needed.      Cholecalciferol (VITAMIN D) 125 MCG (5000 UT) CAPS Take 5,000 Units by mouth daily.      clopidogrel (PLAVIX) 75 MG tablet TAKE 1 TABLET BY MOUTH EVERY DAY 90 tablet 3   Coenzyme Q10 300 MG CAPS Take 300 mg by mouth daily.      ezetimibe (ZETIA) 10 MG tablet TAKE 1 TABLET BY MOUTH EVERY DAY 90 tablet 3   latanoprost (XALATAN) 0.005 % ophthalmic solution 1 drop at bedtime.     Multiple Vitamin (MULTIVITAMIN WITH MINERALS) TABS tablet Take 1 tablet by mouth daily.     polyvinyl alcohol (LIQUIFILM TEARS) 1.4 % ophthalmic solution Place 1 drop into both eyes daily as needed for dry eyes (allergies).     albuterol (VENTOLIN HFA) 108 (90 Base) MCG/ACT inhaler Inhale  2 puffs into the lungs every 6 (six) hours as needed for wheezing or shortness of breath. (Patient not taking: Reported on 01/09/2022) 8 g 0   benzonatate (TESSALON) 100 MG capsule Take 1 capsule (100 mg total) by mouth every 8 (eight) hours. (Patient not taking: Reported on 01/09/2022) 21 capsule 0   predniSONE (DELTASONE) 20 MG tablet Take 2 tablets (40 mg total) by mouth daily. (Patient not taking: Reported on 01/09/2022) 10 tablet 0   promethazine-dextromethorphan (PROMETHAZINE-DM) 6.25-15 MG/5ML syrup Take 5 mLs by mouth 4 (four) times daily as needed for cough. (Patient not taking: Reported on 01/09/2022) 118 mL 0   No current facility-administered medications on file  prior to visit.   No Known Allergies Social History   Socioeconomic History   Marital status: Married    Spouse name: Santiago Glad   Number of children: 2   Years of education: Not on file   Highest education level: Not on file  Occupational History   Not on file  Tobacco Use   Smoking status: Former    Packs/day: 1.50    Years: 30.00    Total pack years: 45.00    Types: Cigarettes    Quit date: 01/19/2009    Years since quitting: 12.9   Smokeless tobacco: Never  Vaping Use   Vaping Use: Never used  Substance and Sexual Activity   Alcohol use: Yes    Alcohol/week: 2.0 standard drinks of alcohol    Types: 2 Cans of beer per week   Drug use: No   Sexual activity: Yes    Birth control/protection: None  Other Topics Concern   Not on file  Social History Narrative   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 He may occasionally lift 20 pounds but most is done by machine.    Mix of standing, sitting, moving around.      Used to walk a lot and do a lot outside. Got a treadmill to use during cold weather.   Married.   2 daughters- grown.         Social Determinants of Health   Financial Resource Strain: Low Risk  (02/23/2021)   Overall Financial Resource Strain (CARDIA)    Difficulty of Paying Living Expenses: Not hard at all  Food Insecurity: No Food Insecurity (02/23/2021)   Hunger Vital Sign    Worried About Running Out of Food in the Last Year: Never true    Ran Out of Food in the Last Year: Never true  Transportation Needs: No Transportation Needs (02/23/2021)   PRAPARE - Hydrologist (Medical): No    Lack of Transportation (Non-Medical): No  Physical Activity: Sufficiently Active (02/23/2021)   Exercise Vital Sign    Days of Exercise per Week: 5 days    Minutes of Exercise per Session: 60 min  Stress: No Stress Concern Present (02/23/2021)   [place]    Feeling of Stress : Not at all  Social Connections: [place] (02/23/2021)   Social Connection and Isolation Panel [NHANES]    Frequency of Communication with Friends and Family: More than three times a week    Frequency of Social Gatherings with Friends and Family: More than three times a week    Attends Religious Services: More than 4 times per year    Active Member of [employer] or Organizations: Yes    Attends [employer]  or Organization Meetings: More than 4 times per year    Marital Status: Married  Human resources officer Violence: Not At Risk (02/23/2021)   Humiliation, Afraid, Rape, and Kick questionnaire    Fear of Current or Ex-Partner: No    Emotionally Abused: No    Physically Abused: No    Sexually Abused: No     Review of Systems  All other systems reviewed and are negative.      Objective:   Physical Exam Vitals reviewed.  Constitutional:      General: He is not in acute distress.    Appearance: Normal appearance. He is normal weight. He is not ill-appearing, toxic-appearing or diaphoretic.  HENT:     Right Ear: Tympanic membrane and ear canal normal. There is no impacted cerumen.     Left Ear: Tympanic membrane and ear canal normal. There is no impacted cerumen.  Neck:     Vascular: Carotid bruit present.  Cardiovascular:     Rate and Rhythm: Normal rate and regular rhythm.     Pulses: Normal pulses.     Heart sounds: Normal heart sounds.     No friction rub. No gallop.  Pulmonary:     Effort: Pulmonary effort is normal. No respiratory distress.     Breath sounds: No stridor. No wheezing, rhonchi or rales.  Chest:     Chest wall: No tenderness.  Abdominal:     Tenderness: There is no left CVA tenderness.  Musculoskeletal:     Right shoulder: No bony tenderness. Decreased range of motion. Normal strength.     Left shoulder: No bony tenderness. Decreased range of motion. Normal strength.     Cervical back: Normal range of  motion and neck supple. No rigidity or tenderness.     Right lower leg: No edema.     Left lower leg: No edema.  Lymphadenopathy:     Cervical: No cervical adenopathy.  Skin:    Findings: No lesion or rash.  Neurological:     General: No focal deficit present.     Mental Status: He is alert and oriented to person, place, and time. Mental status is at baseline.     Cranial Nerves: No cranial nerve deficit.     Motor: No weakness.     Gait: Gait normal.  Psychiatric:        Mood and Affect: Mood normal.        Behavior: Behavior normal.        Thought Content: Thought content normal.        Judgment: Judgment normal.           Assessment & Plan:  Presbycusis of both ears Patient has chronic hearing loss however he is back to his baseline.  There is no residual cerumen impaction in either.  Therefore no further work-up is necessary at this time.  We discussed a referral to defers this at the present time

## 2022-01-29 ENCOUNTER — Ambulatory Visit
Admission: EM | Admit: 2022-01-29 | Discharge: 2022-01-29 | Disposition: A | Discharge status: Home / Self Care | Payer: Medicare Other

## 2022-01-29 DIAGNOSIS — I493 Ventricular premature depolarization: Secondary | ICD-10-CM | POA: Diagnosis not present

## 2022-01-29 DIAGNOSIS — H401131 Primary open-angle glaucoma, bilateral, mild stage: Secondary | ICD-10-CM | POA: Diagnosis not present

## 2022-01-29 DIAGNOSIS — H2512 Age-related nuclear cataract, left eye: Secondary | ICD-10-CM | POA: Diagnosis not present

## 2022-01-29 NOTE — Discharge Instructions (Addendum)
Please follow-up with your cardiologist and primary care provider.

## 2022-01-29 NOTE — ED Provider Notes (Signed)
Jesus Klein    CSN: [id number] Arrival date & time: 01/29/22  1305      History   Chief Complaint Chief Complaint  Patient presents with   medical assessment (Cardiac)    HPI Jesus Klein is a 71 y.o. male.   HPI  Patient presents to urgent care for evaluation of arrhythmia following preprocedure examination at ophthalmology where he was found to have "missed beats" and encouraged to seek urgent evaluation with EKG.  Patient denies any symptoms.  No shortness of breath, dizziness, exercise intolerance.  Patient is under the care of cardiology, treated with Zetia for lipid control, Plavix in the setting of PVD/PAD affecting his carotid, subclavian and peripheral arteries.  Past Medical History:  Diagnosis Date   Allergy    seasonal allergies   Arthritis    arms/shoulders   Blood transfusion without reported diagnosis 2009   with hip replacement   History of tobacco abuse    Hyperlipidemia    on meds   Hypertension 2010   on meds   Left-sided carotid artery disease ([place])    high-grade ostial left common carotid artery stenosis   Peripheral arterial disease ([place])    a. 02/2014 - right common iliac and external iliac intervention. b. H/o PTA/stenting of left common carotid artery stenosis. Known 80% left subclavian artery stenosis without UE claudication/steal sx.   Subclavian artery stenosis, left [facility])     Patient Active Problem List   Diagnosis Date Noted   Subclavian artery stenosis, left ([place]) 05/29/2017   Prostate cancer screening 10/07/2014   History of tobacco abuse    Left-sided carotid artery disease s/p LCCA PTA/Stent 04/28/14 04/28/2014   PVD - Carotid, subclavian, bilat LE disease 03/05/2014   S/P RCIA, RCFA, REIA PTA 03/04/14 03/05/2014   Essential hypertension 02/02/2014   Vitamin D deficiency 02/02/2014   Hyperlipidemia 02/02/2014   Claudication of lower extremity ([place]) 02/02/2014   Bilateral carotid bruits 02/02/2014    Past Surgical  History:  Procedure Laterality Date   CAROTID STENT INSERTION N/A 04/28/2014   Procedure: CAROTID STENT INSERTION;  Surgeon: Serafina Mitchell, MD;  Location: [facility] CATH LAB;  Service: Cardiovascular;  Laterality: N/A;   ILIAC ARTERY STENT Right 03/04/2014   w/as well as right external iliac and common femoral artery stenting/notes 03/04/2014   JOINT REPLACEMENT     LOWER EXTREMITY ANGIOGRAM N/A 03/04/2014   Procedure: LOWER EXTREMITY ANGIOGRAM;  Surgeon: Lorretta Harp, MD;  Location: [facility] CATH LAB;  Service: Cardiovascular;  Laterality: N/A;   TOTAL HIP ARTHROPLASTY Right 10/2005   WISDOM TOOTH EXTRACTION         Home Medications    Prior to Admission medications   Medication Sig Start Date End Date Taking? Authorizing Provider  albuterol (VENTOLIN HFA) 108 (90 Base) MCG/ACT inhaler Inhale 2 puffs into the lungs every 6 (six) hours as needed for wheezing or shortness of breath. Patient not taking: Reported on 01/09/2022 10/06/21   Susy Frizzle, MD  amLODipine (NORVASC) 5 MG tablet TAKE 1 TABLET (5 MG TOTAL) BY MOUTH DAILY. 11/03/21   Susy Frizzle, MD  atorvastatin (LIPITOR) 80 MG tablet TAKE 1 TABLET BY MOUTH EVERY DAY 09/27/21   Susy Frizzle, MD  benzonatate (TESSALON) 100 MG capsule Take 1 capsule (100 mg total) by mouth every 8 (eight) hours. Patient not taking: Reported on 01/09/2022 12/14/21   Hans Eden, NP  cetirizine (ZYRTEC) 10 MG tablet Take 10 mg by mouth daily as needed.  [provider]  Cholecalciferol (VITAMIN D) 125 MCG (5000 UT) CAPS Take 5,000 Units by mouth daily.     [provider]  clopidogrel (PLAVIX) 75 MG tablet TAKE 1 TABLET BY MOUTH EVERY DAY 06/14/21   Susy Frizzle, MD  Coenzyme Q10 300 MG CAPS Take 300 mg by mouth daily.     [provider]  ezetimibe (ZETIA) 10 MG tablet TAKE 1 TABLET BY MOUTH EVERY DAY 05/08/21   Lorretta Harp, MD  latanoprost (XALATAN) 0.005 % ophthalmic solution 1 drop at bedtime.     [provider]  Multiple Vitamin (MULTIVITAMIN WITH MINERALS) TABS tablet Take 1 tablet by mouth daily.    [provider]  polyvinyl alcohol (LIQUIFILM TEARS) 1.4 % ophthalmic solution Place 1 drop into both eyes daily as needed for dry eyes (allergies).    [provider]  predniSONE (DELTASONE) 20 MG tablet Take 2 tablets (40 mg total) by mouth daily. Patient not taking: Reported on 01/09/2022 12/14/21   Hans Eden, NP  promethazine-dextromethorphan (PROMETHAZINE-DM) 6.25-15 MG/5ML syrup Take 5 mLs by mouth 4 (four) times daily as needed for cough. Patient not taking: Reported on 01/09/2022 12/14/21   Hans Eden, NP    Family History Family History  Problem Relation Age of Onset   Hyperlipidemia Mother    Colitis Mother    Diabetes Mother    Lung cancer Mother 22   Liver cancer Mother 28       mets from lung   Brain cancer Mother 56       mets from lung   Kidney cancer Mother 33       mets from 85   Diverticulitis Mother    Arthritis Father    COPD Father    Diabetes Father    Hypertension Father    Colon cancer Neg Hx    Stomach cancer Neg Hx    Colon polyps Neg Hx    Esophageal cancer Neg Hx    Rectal cancer Neg Hx     Social History Social History   Tobacco Use   Smoking status: Former    Packs/day: 1.50    Years: 30.00    Total pack years: 45.00    Types: Cigarettes    Quit date: 01/19/2009    Years since quitting: 13.0   Smokeless tobacco: Never  Vaping Use   Vaping Use: Never used  Substance Use Topics   Alcohol use: Yes    Alcohol/week: 2.0 standard drinks of alcohol    Types: 2 Cans of beer per week   Drug use: No     Allergies   Patient has no known allergies.   Review of Systems Review of Systems   Physical Exam Triage Vital Signs ED Triage Vitals  Enc Vitals Group     BP 01/29/22 1345 130/79     Pulse Rate 01/29/22 1345 73     Resp 01/29/22 1345 15     Temp 01/29/22 1345 98.2 F (36.8 C)      Temp src --      SpO2 01/29/22 1345 94 %     Weight --      Height --      Head Circumference --      Peak Flow --      Pain Score 01/29/22 1346 0     Pain Loc --      Pain Edu? --      Excl. in [place]? --  No data found.  Updated Vital Signs BP 130/79   Pulse 73   Temp 98.2 F (36.8 C)   Resp 15   SpO2 94%   Visual Acuity Right Eye Distance:   Left Eye Distance:   Bilateral Distance:    Right Eye Near:   Left Eye Near:    Bilateral Near:     Physical Exam Vitals reviewed.  Constitutional:      Appearance: Normal appearance.  Cardiovascular:     Rate and Rhythm: Tachycardia present. Rhythm irregular.  Skin:    General: Skin is warm and dry.  Neurological:     General: No focal deficit present.     Mental Status: He is alert and oriented to person, place, and time.  Psychiatric:        Mood and Affect: Mood normal.        Behavior: Behavior normal.      UC Treatments / Results  Labs (all labs ordered are listed, but only abnormal results are displayed) Labs Reviewed - No data to display  EKG   Radiology No results found.  Procedures Procedures (including critical care time)  Medications Ordered in UC Medications - No data to display  Initial Impression / Assessment and Plan / UC Course  I have reviewed the triage vital signs and the nursing notes.  Pertinent labs & imaging results that were available during my care of the patient were reviewed by me and considered in my medical decision making (see chart for details).   ECG shows frequent PVCs with episodes of bigeminy.  Compared with 06/07/2021 ECG at cardiology where PVCs were present but less frequent.  Rate is regular.  Provided reassurance to patient given he is asymptomatic.  Encouraged him to follow-up with his scheduled cardiac evaluation in January.  Asked him to present for sooner evaluation if he developed symptoms such as shortness of breath or dizziness.   Final Clinical Impressions(s)  / UC Diagnoses   Final diagnoses:  None   Discharge Instructions   None    ED Prescriptions   None    PDMP not reviewed this encounter.   Rose Phi, [place] 01/29/22 1401

## 2022-01-29 NOTE — ED Triage Notes (Signed)
Pt. States he was evaluated for other medical reasons and was advised to get an EKG as soon as possible to evaluate some missed heart beats assessed by his other medical professional.

## 2022-01-31 ENCOUNTER — Telehealth: Payer: Self-pay

## 2022-01-31 NOTE — Telephone Encounter (Signed)
   Pre-operative Risk Assessment    Patient Name: Jesus Klein  DOB: 11/07/1950 MRN: [id number]      Request for Surgical Clearance    Procedure:  COMPLEX CATARACT/WISTENT/HYDRUS-LEFT  Date of Surgery:  Clearance 02/12/22                                 Surgeon:  DR Harrell Gave T.SHAH Surgeon's Group or Practice Name:  [facility] Phone number:  [phone] Fax number:  [phone]   Type of Clearance Requested:   - Pharmacy:  Hold Clopidogrel (Plavix) 5-7 DAYS PRIOR TO SURGERY   Type of Anesthesia:   IV SEDATION   Additional requests/questions:  Please fax a copy of clearance to [phone] to the surgeon's office.  Bronwen Betters Rhapsody [place]  01/31/2022, 2:59 PM

## 2022-01-31 NOTE — Telephone Encounter (Signed)
   Patient Name: Jesus Klein  DOB: [birth year] MRN: [id number]  Primary Cardiologist: Quay Burow, MD  Chart reviewed as part of pre-operative protocol coverage. Given past medical history and time since last visit, based on ACC/AHA guidelines, and in consultation with Dr. Gwenlyn Found, [name] is at acceptable risk for the planned procedure without further cardiovascular testing.   He may hold plavix for 5 days prior to planned procedure with recommendation to resume post-procedure when deemed clinically feasible by surgical team.   I will route this recommendation to the requesting party via [place] fax function and remove from pre-op pool.  Please call with questions.  Murray Hodgkins, NP 01/31/2022, 4:16 PM

## 2022-02-12 DIAGNOSIS — H409 Unspecified glaucoma: Secondary | ICD-10-CM | POA: Diagnosis not present

## 2022-02-12 DIAGNOSIS — H401121 Primary open-angle glaucoma, left eye, mild stage: Secondary | ICD-10-CM | POA: Diagnosis not present

## 2022-02-12 DIAGNOSIS — H2512 Age-related nuclear cataract, left eye: Secondary | ICD-10-CM | POA: Diagnosis not present

## 2022-02-12 DIAGNOSIS — H269 Unspecified cataract: Secondary | ICD-10-CM | POA: Diagnosis not present

## 2022-02-23 ENCOUNTER — Ambulatory Visit (INDEPENDENT_AMBULATORY_CARE_PROVIDER_SITE_OTHER): Payer: Medicare Other

## 2022-02-23 VITALS — BP 134/76 | HR 68 | Temp 97.9°F | Ht 71.0 in | Wt 183.0 lb

## 2022-02-23 DIAGNOSIS — Z0000 Encounter for general adult medical examination without abnormal findings: Secondary | ICD-10-CM | POA: Diagnosis not present

## 2022-02-23 NOTE — Progress Notes (Signed)
Subjective:   Jesus Klein is a 71 y.o. male who presents for Medicare Annual/Subsequent preventive examination.  Review of Systems     Cardiac Risk Factors include: advanced age (>47mn, >>78women);male gender     Objective:    Today's Vitals   02/23/22 0935  BP: 134/76  Pulse: 68  Temp: 97.9 F (36.6 C)  SpO2: 98%  Weight: 183 lb (83 kg)  Height: '5\' 11"'$  (1.803 m)  PainSc: 8    Body mass index is 25.52 kg/m.     02/23/2022    9:46 AM 02/23/2021    9:54 AM 03/25/2020    9:34 AM 02/14/2018    8:36 AM 06/29/2016    3:23 PM 04/28/2014    5:00 PM 04/28/2014    6:40 AM  Advanced Directives  Does Patient Have a Medical Advance Directive? Yes Yes Yes No;Yes No No No  Type of AAcademic librarianLiving will HVaughnLiving will HBlackduckLiving will     Does patient want to make changes to medical advance directive?   No - Patient declined No - Patient declined     Copy of HBowling Greenin Chart? No - copy requested  Yes - validated most recent copy scanned in chart (See row information) No - copy requested     Would patient like information on creating a medical advance directive?  No - Patient declined  No - Patient declined  No - patient declined information No - patient declined information    Current Medications (verified) Outpatient Encounter Medications as of 02/23/2022  Medication Sig   albuterol (VENTOLIN HFA) 108 (90 Base) MCG/ACT inhaler Inhale 2 puffs into the lungs every 6 (six) hours as needed for wheezing or shortness of breath.   amLODipine (NORVASC) 5 MG tablet TAKE 1 TABLET (5 MG TOTAL) BY MOUTH DAILY.   atorvastatin (LIPITOR) 80 MG tablet TAKE 1 TABLET BY MOUTH EVERY DAY   benzonatate (TESSALON) 100 MG capsule Take 1 capsule (100 mg total) by mouth every 8 (eight) hours.   cetirizine (ZYRTEC) 10 MG tablet Take 10 mg by mouth daily as needed.    Cholecalciferol (VITAMIN D) 125 MCG  (5000 UT) CAPS Take 5,000 Units by mouth daily.    clopidogrel (PLAVIX) 75 MG tablet TAKE 1 TABLET BY MOUTH EVERY DAY   Coenzyme Q10 300 MG CAPS Take 300 mg by mouth daily.    ezetimibe (ZETIA) 10 MG tablet TAKE 1 TABLET BY MOUTH EVERY DAY   latanoprost (XALATAN) 0.005 % ophthalmic solution 1 drop at bedtime.   Multiple Vitamin (MULTIVITAMIN WITH MINERALS) TABS tablet Take 1 tablet by mouth daily.   polyvinyl alcohol (LIQUIFILM TEARS) 1.4 % ophthalmic solution Place 1 drop into both eyes daily as needed for dry eyes (allergies).   predniSONE (DELTASONE) 20 MG tablet Take 2 tablets (40 mg total) by mouth daily.   promethazine-dextromethorphan (PROMETHAZINE-DM) 6.25-15 MG/5ML syrup Take 5 mLs by mouth 4 (four) times daily as needed for cough.   No facility-administered encounter medications on file as of 02/23/2022.    Allergies (verified) Patient has no known allergies.   History: Past Medical History:  Diagnosis Date   Allergy    seasonal allergies   Arthritis    arms/shoulders   Blood transfusion without reported diagnosis 2009   with hip replacement   History of tobacco abuse    Hyperlipidemia    on meds   Hypertension 2010  on meds   Left-sided carotid artery disease ([place])    high-grade ostial left common carotid artery stenosis   Peripheral arterial disease ([place])    a. 02/2014 - right common iliac and external iliac intervention. b. H/o PTA/stenting of left common carotid artery stenosis. Known 80% left subclavian artery stenosis without UE claudication/steal sx.   Subclavian artery stenosis, left [facility])    Past Surgical History:  Procedure Laterality Date   CAROTID STENT INSERTION N/A 04/28/2014   Procedure: CAROTID STENT INSERTION;  Surgeon: Serafina Mitchell, MD;  Location: [facility] CATH LAB;  Service: Cardiovascular;  Laterality: N/A;   ILIAC ARTERY STENT Right 03/04/2014   w/as well as right external iliac and common femoral artery stenting/notes 03/04/2014   JOINT REPLACEMENT      LOWER EXTREMITY ANGIOGRAM N/A 03/04/2014   Procedure: LOWER EXTREMITY ANGIOGRAM;  Surgeon: Lorretta Harp, MD;  Location: [facility] CATH LAB;  Service: Cardiovascular;  Laterality: N/A;   TOTAL HIP ARTHROPLASTY Right 10/2005   WISDOM TOOTH EXTRACTION     Family History  Problem Relation Age of Onset   Hyperlipidemia Mother    Colitis Mother    Diabetes Mother    Lung cancer Mother 59   Liver cancer Mother 61       mets from lung   Brain cancer Mother 40       mets from lung   Kidney cancer Mother 72       mets from 43   Diverticulitis Mother    Arthritis Father    COPD Father    Diabetes Father    Hypertension Father    Colon cancer Neg Hx    Stomach cancer Neg Hx    Colon polyps Neg Hx    Esophageal cancer Neg Hx    Rectal cancer Neg Hx    Social History   Socioeconomic History   Marital status: Married    Spouse name: Jesus Klein   Number of children: 2   Years of education: Not on file   Highest education level: Not on file  Occupational History   Not on file  Tobacco Use   Smoking status: Former    Packs/day: 1.50    Years: 30.00    Total pack years: 45.00    Types: Cigarettes    Quit date: 01/19/2009    Years since quitting: 13.1   Smokeless tobacco: Never  Vaping Use   Vaping Use: Never used  Substance and Sexual Activity   Alcohol use: Yes    Alcohol/week: 2.0 standard drinks of alcohol    Types: 2 Cans of beer per week   Drug use: No   Sexual activity: Yes    Birth control/protection: None  Other Topics Concern   Not on file  Social History Narrative   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 He may occasionally lift 20 pounds but most is done by machine.    Mix of standing, sitting, moving around.      Used to walk a lot and do a lot outside. Got a treadmill to use during cold weather.   Married.   2 daughters- grown.         Social Determinants of Health   Financial Resource Strain: Low  Risk  (02/23/2022)   Overall Financial Resource Strain (CARDIA)    Difficulty of Paying Living Expenses: Not very hard  Food Insecurity: No Food Insecurity (02/23/2022)   Hunger Vital Sign  Worried About Charity fundraiser in the Last Year: Never true    [place] in the Last Year: Never true  Transportation Needs: No Transportation Needs (02/23/2021)   PRAPARE - Hydrologist (Medical): No    Lack of Transportation (Non-Medical): No  Physical Activity: Sufficiently Active (02/23/2022)   Exercise Vital Sign    Days of Exercise per Week: 7 days    Minutes of Exercise per Session: 50 min  Stress: No Stress Concern Present (02/23/2021)   [place]    Feeling of Stress : Not at all  Social Connections: [place] (02/23/2021)   Social Connection and Isolation Panel [NHANES]    Frequency of Communication with Friends and Family: More than three times a week    Frequency of Social Gatherings with Friends and Family: More than three times a week    Attends Religious Services: More than 4 times per year    Active Member of [employer] or Organizations: Yes    Attends Music therapist: More than 4 times per year    Marital Status: Married    Tobacco Counseling Counseling given: Not Answered   Clinical Intake:  Pre-visit preparation completed: Yes  Pain : 0-10 (twist L-knee, sore to put wt on, taking tylenol) Pain Score: 8  (twist L-knee, sore to put wt on, taking tylenol) Pain Type: Acute pain, Other (Comment) (twist L-knee, sore to put wt on, taking tylenol) Pain Location: Knee (twist L-knee, sore to put wt on, taking tylenol) Pain Orientation: Left Pain Descriptors / Indicators: Sore (twist L-knee, sore to put wt on, taking tylenol) Pain Onset: In the past 7 days Pain Frequency: Intermittent Pain Relieving Factors: twist L-knee, sore to put wt on, taking  tylenol Effect of Pain on Daily Activities: none  Pain Relieving Factors: twist L-knee, sore to put wt on, taking tylenol  Diabetes: No  How often do you need to have someone help you when you read instructions, pamphlets, or other written materials from your doctor or pharmacy?: 1 - Never What is the last grade level you completed in school?: BA,BS  Diabetic?No  Interpreter Needed?: No      Activities of Daily Living    02/23/2022    9:49 AM 02/23/2022    9:47 AM  In your present state of health, do you have any difficulty performing the following activities:  Hearing? 0 0  Vision? 0 0  Difficulty concentrating or making decisions? 0 0  Walking or climbing stairs? 0 0  Dressing or bathing? 0 0  Doing errands, shopping? 0 0  Preparing Food and eating ? Y Y  Using the Toilet? Y Y  In the past six months, have you accidently leaked urine? N N  Do you have problems with loss of bowel control? N N  Managing your Medications? Y Y  Managing your Finances? Tempie Donning  Housekeeping or managing your Housekeeping? Tempie Donning    Patient Care Team: Susy Frizzle, MD as PCP - General (Family Medicine) Lorretta Harp, MD as PCP - Cardiology (Cardiology) Edythe Clarity, [facility] as Pharmacist (Pharmacist)  Indicate any recent Medical Services you may have received from other than [facility] providers in the past year (date may be approximate).     Assessment:   This is a routine wellness examination for [place].  Hearing/Vision screen No results found.  Dietary issues and exercise activities discussed: Current Exercise Habits:  Home exercise routine, Type of exercise: treadmill, Time (Minutes): 45, Frequency (Times/Week): 7, Weekly Exercise (Minutes/Week): 315, Intensity: Mild, Exercise limited by: None identified   Goals Addressed             This Visit's Progress    Activity and Exercise Increased       Continue exercise/staying healthy      Depression Screen    02/23/2022     9:34 AM 01/09/2022    3:59 PM 10/06/2021   11:06 AM 04/06/2021   10:22 AM 02/23/2021    9:50 AM 03/25/2020    9:28 AM 03/30/2019   11:51 AM  PHQ 2/9 Scores  PHQ - 2 Score 0 0 0 0 0 0 0  PHQ- 9 Score 0   0       Fall Risk    02/23/2022    9:34 AM 10/06/2021   11:06 AM 04/06/2021   10:22 AM 02/23/2021    9:56 AM 03/25/2020    9:28 AM  Fall Risk   Falls in the past year? 0 0 1 1 0  Number falls in past yr:   1 0   Injury with Fall?   0 1   Risk for fall due to :    Impaired vision;History of fall(s) No Fall Risks  Follow up  Falls evaluation completed  Falls prevention discussed Falls evaluation completed    [place]:  Any stairs in or around the home? Yes  If so, are there any without handrails? Yes  Home free of loose throw rugs in walkways, pet beds, electrical cords, etc? Yes  Adequate lighting in your home to reduce risk of falls? Yes   ASSISTIVE DEVICES UTILIZED TO PREVENT FALLS:  Life alert? No  Use of a cane, walker or w/c? No  Grab bars in the bathroom? Yes  Shower chair or bench in shower? Yes  Elevated toilet seat or a handicapped toilet? Yes   TIMED UP AND GO:  Was the test performed?  n/a .  Length of time to ambulate 10 feet: n/a sec.     Cognitive Function:        02/23/2022    9:54 AM 02/23/2021   10:00 AM  6CIT Screen  What Year? 0 points 0 points  What month? 0 points 0 points  What time? 0 points 0 points  Count back from 20 0 points 0 points  Months in reverse 0 points 0 points  Repeat phrase 2 points 0 points  Total Score 2 points 0 points    Immunizations Immunization History  Administered Date(s) Administered   Fluad Quad(high Dose 65+) 12/23/2018, 02/05/2020   Influenza,inj,Quad PF,6+ Mos 02/02/2014   Influenza-Unspecified 03/03/2018, 01/16/2021   PFIZER(Purple Top)SARS-COV-2 Vaccination 06/16/2019, 07/07/2019, 03/01/2020, 01/16/2021   Pneumococcal Conjugate-13 04/23/2016   Pneumococcal  Polysaccharide-23 04/23/2017   Tdap 09/02/2010   Zoster, Live 11/07/2015    TDAP status: Due, Education has been provided regarding the importance of this vaccine. Advised may receive this vaccine at local pharmacy or Health Dept. Aware to provide a copy of the vaccination record if obtained from local pharmacy or Health Dept. Verbalized acceptance and understanding.  Flu Vaccine status: Due, Education has been provided regarding the importance of this vaccine. Advised may receive this vaccine at local pharmacy or Health Dept. Aware to provide a copy of the vaccination record if obtained from local pharmacy or Health Dept. Verbalized acceptance and understanding. Pnuemonia: Due, per pt understand and  plans to get it.   Covid-19 vaccine status: Information provided on how to obtain vaccines.   Qualifies for Shingles Vaccine? No   Zostavax completed No   Shingrix Completed?: No.    Education has been provided regarding the importance of this vaccine. Patient has been advised to call insurance company to determine out of pocket expense if they have not yet received this vaccine. Advised may also receive vaccine at local pharmacy or Health Dept. Verbalized acceptance and understanding.  Screening Tests Health Maintenance  Topic Date Due   Lung Cancer Screening  Never done   Zoster Vaccines- Shingrix (1 of 2) Never done   COVID-19 Vaccine (5 - Pfizer series) 03/13/2021   INFLUENZA VACCINE  11/21/2021   TETANUS/TDAP  10/07/2022 (Originally 09/04/2020)   COLONOSCOPY (Pts 45-50yr Insurance coverage will need to be confirmed)  02/01/2023   Medicare Annual Wellness (AWV)  02/24/2023   Pneumonia Vaccine 70 Years old  Completed   Hepatitis C Screening  Completed   HPV VACCINES  Aged Out    Health Maintenance  Health Maintenance Due  Topic Date Due   Lung Cancer Screening  Never done   Zoster Vaccines- Shingrix (1 of 2) Never done   COVID-19 Vaccine (5 - Pfizer series) 03/13/2021    INFLUENZA VACCINE  11/21/2021    Colorectal cancer screening: Type of screening: Colonoscopy. Completed 01/2020. Repeat every 2024 years  Lung Cancer Screening: (Low Dose CT Chest recommended if Age 70-80years, 30 pack-year currently smoking OR have quit w/in 15years.) does not qualify.   Lung Cancer Screening Referral: n/a  Additional Screening:  Hepatitis C Screening: does not qualify; Completed n/a  Vision Screening: Recommended annual ophthalmology exams for early detection of glaucoma and other disorders of the eye. Is the patient up to date with their annual eye exam?  Yes  Who is the provider or what is the name of the office in which the patient attends annual eye exams? Dr. SManuella GhaziIf pt is not established with a provider, would they like to be referred to a provider to establish care? No .   Dental Screening: Recommended annual dental exams for proper oral hygiene  Community Resource Referral / Chronic Care Management: CRR required this visit?  No   CCM required this visit?  No      Plan:     I have personally reviewed and noted the following in the patient's chart:   Medical and social history Use of alcohol, tobacco or illicit drugs  Current medications and supplements including opioid prescriptions. Patient is not currently taking opioid prescriptions. Functional ability and status Nutritional status Physical activity Advanced directives List of other physicians Hospitalizations, surgeries, and ER visits in previous 12 months Vitals Screenings to include cognitive, depression, and falls Referrals and appointments  In addition, I have reviewed and discussed with patient certain preventive protocols, quality metrics, and best practice recommendations. A written personalized care plan for preventive services as well as general preventive health recommendations were provided to patient.     BColman Cater CAlma  02/23/2022   Nurse Notes: n/a

## 2022-02-26 DIAGNOSIS — H409 Unspecified glaucoma: Secondary | ICD-10-CM | POA: Diagnosis not present

## 2022-02-26 DIAGNOSIS — H2511 Age-related nuclear cataract, right eye: Secondary | ICD-10-CM | POA: Diagnosis not present

## 2022-02-26 DIAGNOSIS — H401111 Primary open-angle glaucoma, right eye, mild stage: Secondary | ICD-10-CM | POA: Diagnosis not present

## 2022-02-26 DIAGNOSIS — H269 Unspecified cataract: Secondary | ICD-10-CM | POA: Diagnosis not present

## 2022-04-25 ENCOUNTER — Other Ambulatory Visit: Payer: Self-pay | Admitting: Cardiovascular Disease

## 2022-05-05 ENCOUNTER — Other Ambulatory Visit: Payer: Self-pay | Admitting: Family Medicine

## 2022-05-07 NOTE — Telephone Encounter (Signed)
Requested Prescriptions  Pending Prescriptions Disp Refills   amLODipine (NORVASC) 5 MG tablet [Pharmacy Med Name: AMLODIPINE BESYLATE 5 MG TAB] 90 tablet 0    Sig: TAKE 1 TABLET (5 MG TOTAL) BY MOUTH DAILY.     Cardiovascular: Calcium Channel Blockers 2 Failed - 05/05/2022  8:33 AM      Failed - Valid encounter within last 6 months    Recent Outpatient Visits           1 year ago Encounter for Medicare annual wellness exam   [place] Dennard Schaumann, Cammie Mcgee, MD   1 year ago Left sided sciatica   [place] Susy Frizzle, MD   2 years ago Routine general medical examination at a health care facility   [place], [place] Nunnery, MD   3 years ago Essential hypertension   [place], [place] Nunnery, MD   4 years ago Encounter for [employer] annual wellness exam   [place] Delsa Grana, PA-C              Passed - Last BP in normal range    BP Readings from Last 1 Encounters:  02/23/22 134/76         Passed - Last Heart Rate in normal range    Pulse Readings from Last 1 Encounters:  02/23/22 68

## 2022-05-15 ENCOUNTER — Ambulatory Visit
Admission: RE | Admit: 2022-05-15 | Discharge: 2022-05-15 | Disposition: A | Discharge status: Home / Self Care | Payer: Medicare Other | Source: Ambulatory Visit | Attending: Family Medicine | Admitting: Family Medicine

## 2022-05-15 ENCOUNTER — Encounter: Payer: Self-pay | Admitting: Family Medicine

## 2022-05-15 ENCOUNTER — Ambulatory Visit (INDEPENDENT_AMBULATORY_CARE_PROVIDER_SITE_OTHER): Payer: Medicare Other | Admitting: Family Medicine

## 2022-05-15 ENCOUNTER — Ambulatory Visit
Admission: RE | Admit: 2022-05-15 | Discharge: 2022-05-15 | Disposition: A | Discharge status: Home / Self Care | Payer: Medicare Other | Attending: Family Medicine | Admitting: Family Medicine

## 2022-05-15 VITALS — BP 136/82 | HR 80 | Temp 98.1°F | Ht 71.0 in | Wt 191.2 lb

## 2022-05-15 DIAGNOSIS — R0602 Shortness of breath: Secondary | ICD-10-CM | POA: Diagnosis not present

## 2022-05-15 DIAGNOSIS — R0609 Other forms of dyspnea: Secondary | ICD-10-CM

## 2022-05-15 NOTE — Progress Notes (Signed)
Subjective:    Patient ID: Jesus Klein, male    DOB: September 30, 1950, 72 y.o.   MRN: [id number]  Medication Refill  Shortness of Breath  Presents today for the last month had progressive dyspnea on exertion.  Has been sitting more easily winded with minimal activity.  He denies any chest pain or chest pressure.  He does have a history of tobacco abuse.  He denies any wheezing but he does report a cough.  He denies any tightness in his chest.  He denies any fever or chills or hemoptysis.  EKG today shows normal sinus rhythm with frequent PVCs but no evidence of ischemia or infarction.  He does have  normal intervals and normal axis.  Patient had an echocardiogram in 2022 that showed ejection fraction of 60% with grade 1 diastolic dysfunction.  He has a history of carotid artery disease as well as peripheral vascular disease but has no known coronary artery disease. Past Medical History:  Diagnosis Date   Allergy    seasonal allergies   Arthritis    arms/shoulders   Blood transfusion without reported diagnosis 2009   with hip replacement   History of tobacco abuse    Hyperlipidemia    on meds   Hypertension 2010   on meds   Left-sided carotid artery disease ([place])    high-grade ostial left common carotid artery stenosis   Peripheral arterial disease ([place])    a. 02/2014 - right common iliac and external iliac intervention. b. H/o PTA/stenting of left common carotid artery stenosis. Known 80% left subclavian artery stenosis without UE claudication/steal sx.   Subclavian artery stenosis, left [facility])    Past Surgical History:  Procedure Laterality Date   CAROTID STENT INSERTION N/A 04/28/2014   Procedure: CAROTID STENT INSERTION;  Surgeon: Serafina Mitchell, MD;  Location: [facility] CATH LAB;  Service: Cardiovascular;  Laterality: N/A;   ILIAC ARTERY STENT Right 03/04/2014   w/as well as right external iliac and common femoral artery stenting/notes 03/04/2014   JOINT REPLACEMENT     LOWER EXTREMITY  ANGIOGRAM N/A 03/04/2014   Procedure: LOWER EXTREMITY ANGIOGRAM;  Surgeon: Lorretta Harp, MD;  Location: [facility] CATH LAB;  Service: Cardiovascular;  Laterality: N/A;   TOTAL HIP ARTHROPLASTY Right 10/2005   WISDOM TOOTH EXTRACTION     Current Outpatient Medications on File Prior to Visit  Medication Sig Dispense Refill   amLODipine (NORVASC) 5 MG tablet TAKE 1 TABLET (5 MG TOTAL) BY MOUTH DAILY. 90 tablet 0   atorvastatin (LIPITOR) 80 MG tablet TAKE 1 TABLET BY MOUTH EVERY DAY 90 tablet 2   cetirizine (ZYRTEC) 10 MG tablet Take 10 mg by mouth daily as needed.      Cholecalciferol (VITAMIN D) 125 MCG (5000 UT) CAPS Take 5,000 Units by mouth daily.      clopidogrel (PLAVIX) 75 MG tablet TAKE 1 TABLET BY MOUTH EVERY DAY 90 tablet 3   Coenzyme Q10 300 MG CAPS Take 300 mg by mouth daily.      COMIRNATY SUSP injection      ezetimibe (ZETIA) 10 MG tablet TAKE 1 TABLET BY MOUTH EVERY DAY 90 tablet 3   FLUAD QUADRIVALENT 0.5 ML injection      latanoprost (XALATAN) 0.005 % ophthalmic solution 1 drop at bedtime.     Multiple Vitamin (MULTIVITAMIN WITH MINERALS) TABS tablet Take 1 tablet by mouth daily.     polyvinyl alcohol (LIQUIFILM TEARS) 1.4 % ophthalmic solution Place 1 drop into both eyes daily as  needed for dry eyes (allergies).     No current facility-administered medications on file prior to visit.   No Known Allergies Social History   Socioeconomic History   Marital status: Married    Spouse name: Santiago Glad   Number of children: 2   Years of education: Not on file   Highest education level: Not on file  Occupational History   Not on file  Tobacco Use   Smoking status: Former    Packs/day: 1.50    Years: 30.00    Total pack years: 45.00    Types: Cigarettes    Quit date: 01/19/2009    Years since quitting: 13.3   Smokeless tobacco: Never  Vaping Use   Vaping Use: Never used  Substance and Sexual Activity   Alcohol use: Yes    Alcohol/week: 2.0 standard drinks of alcohol     Types: 2 Cans of beer per week   Drug use: No   Sexual activity: Yes    Birth control/protection: None  Other Topics Concern   Not on file  Social History Narrative   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 He may occasionally lift 20 pounds but most is done by machine.    Mix of standing, sitting, moving around.      Used to walk a lot and do a lot outside. Got a treadmill to use during cold weather.   Married.   2 daughters- grown.         Social Determinants of Health   Financial Resource Strain: Low Risk  (02/23/2022)   Overall Financial Resource Strain (CARDIA)    Difficulty of Paying Living Expenses: Not very hard  Food Insecurity: No Food Insecurity (02/23/2022)   Hunger Vital Sign    Worried About Running Out of Food in the Last Year: Never true    Ran Out of Food in the Last Year: Never true  Transportation Needs: No Transportation Needs (02/23/2021)   PRAPARE - Hydrologist (Medical): No    Lack of Transportation (Non-Medical): No  Physical Activity: Sufficiently Active (02/23/2022)   Exercise Vital Sign    Days of Exercise per Week: 7 days    Minutes of Exercise per Session: 50 min  Stress: No Stress Concern Present (02/23/2021)   [place]    Feeling of Stress : Not at all  Social Connections: [place] (02/23/2021)   Social Connection and Isolation Panel [NHANES]    Frequency of Communication with Friends and Family: More than three times a week    Frequency of Social Gatherings with Friends and Family: More than three times a week    Attends Religious Services: More than 4 times per year    Active Member of [employer] or Organizations: Yes    Attends Archivist Meetings: More than 4 times per year    Marital Status: Married  Human resources officer Violence: Not At Risk (02/23/2021)   Humiliation, Afraid,  Rape, and Kick questionnaire    Fear of Current or Ex-Partner: No    Emotionally Abused: No    Physically Abused: No    Sexually Abused: No     Review of Systems  Respiratory:  Positive for shortness of breath.   All other systems reviewed and are negative.      Objective:   Physical Exam Vitals reviewed.  Constitutional:      General:  He is not in acute distress.    Appearance: Normal appearance. He is normal weight. He is not ill-appearing, toxic-appearing or diaphoretic.  Neck:     Vascular: Carotid bruit present.  Cardiovascular:     Rate and Rhythm: Normal rate and regular rhythm.     Pulses: Normal pulses.     Heart sounds: Normal heart sounds.     No friction rub. No gallop.  Pulmonary:     Effort: Pulmonary effort is normal. No respiratory distress.     Breath sounds: No stridor. No wheezing, rhonchi or rales.  Chest:     Chest wall: No tenderness.  Abdominal:     General: Abdomen is flat. Bowel sounds are normal. There is no distension.     Palpations: Abdomen is soft. There is no mass.     Tenderness: There is no abdominal tenderness. There is no right CVA tenderness, left CVA tenderness, guarding or rebound.     Hernia: No hernia is present.  Musculoskeletal:     Cervical back: Normal range of motion and neck supple. No rigidity or tenderness.     Right lower leg: No edema.     Left lower leg: No edema.  Lymphadenopathy:     Cervical: No cervical adenopathy.  Skin:    Findings: No lesion or rash.  Neurological:     General: No focal deficit present.     Mental Status: He is alert and oriented to person, place, and time. Mental status is at baseline.     Cranial Nerves: No cranial nerve deficit.     Motor: No weakness.     Gait: Gait normal.  Psychiatric:        Mood and Affect: Mood normal.        Behavior: Behavior normal.        Thought Content: Thought content normal.        Judgment: Judgment normal.           Assessment & Plan:    Dyspnea on exertion - Plan: EKG 12-Lead, DG Chest 2 View, Brain natriuretic peptide, CBC with Differential/Platelet, COMPLETE METABOLIC PANEL WITH GFR Patient has extensive cardiovascular disease.  Therefore I would like to schedule him to see his cardiologist for a stress test to rule out any underlying ischemia as a cause of his dyspnea on exertion.  I will also get a chest x-ray to evaluate for any structural lung disease.  Check a CBC to rule out anemia and check a BNP to evaluate for potential heart failure however he does not appear fluid overloaded on exam today.  I suspect likely underlying COPD.  I gave the patient samples of Trelegy 1 inhalation daily and I would like to see if this improves his dyspnea on exertion while awaiting the cardiology referral

## 2022-05-16 LAB — CBC WITH DIFFERENTIAL/PLATELET
Absolute Monocytes: 666 cells/uL (ref 200–950)
Basophils Absolute: 32 cells/uL (ref 0–200)
Basophils Relative: 0.5 %
Eosinophils Absolute: 198 cells/uL (ref 15–500)
Eosinophils Relative: 3.1 %
HCT: 49.7 % (ref 38.5–50.0)
Hemoglobin: 16.6 g/dL (ref 13.2–17.1)
Lymphs Abs: 2202 cells/uL (ref 850–3900)
MCH: 29.4 pg (ref 27.0–33.0)
MCHC: 33.4 g/dL (ref 32.0–36.0)
MCV: 88 fL (ref 80.0–100.0)
MPV: 10.4 fL (ref 7.5–12.5)
Monocytes Relative: 10.4 %
Neutro Abs: 3302 cells/uL (ref 1500–7800)
Neutrophils Relative %: 51.6 %
Platelets: 302 10*3/uL (ref 140–400)
RBC: 5.65 10*6/uL (ref 4.20–5.80)
RDW: 12.5 % (ref 11.0–15.0)
Total Lymphocyte: 34.4 %
WBC: 6.4 10*3/uL (ref 3.8–10.8)

## 2022-05-16 LAB — COMPLETE METABOLIC PANEL WITH GFR
AG Ratio: 1.8 (calc) (ref 1.0–2.5)
ALT: 25 U/L (ref 9–46)
AST: 21 U/L (ref 10–35)
Albumin: 4.7 g/dL (ref 3.6–5.1)
Alkaline phosphatase (APISO): 74 U/L (ref 35–144)
BUN: 16 mg/dL (ref 7–25)
CO2: 29 mmol/L (ref 20–32)
Calcium: 10.2 mg/dL (ref 8.6–10.3)
Chloride: 104 mmol/L (ref 98–110)
Creat: 0.85 mg/dL (ref 0.70–1.28)
Globulin: 2.6 g/dL (calc) (ref 1.9–3.7)
Glucose, Bld: 94 mg/dL (ref 65–99)
Potassium: 5 mmol/L (ref 3.5–5.3)
Sodium: 141 mmol/L (ref 135–146)
Total Bilirubin: 0.6 mg/dL (ref 0.2–1.2)
Total Protein: 7.3 g/dL (ref 6.1–8.1)
eGFR: 93 mL/min/{1.73_m2} (ref >=60)

## 2022-05-16 LAB — BRAIN NATRIURETIC PEPTIDE: Brain Natriuretic Peptide: 88 pg/mL (ref <100)

## 2022-05-28 ENCOUNTER — Ambulatory Visit (HOSPITAL_BASED_OUTPATIENT_CLINIC_OR_DEPARTMENT_OTHER)
Admission: RE | Admit: 2022-05-28 | Discharge: 2022-05-28 | Disposition: A | Discharge status: Home / Self Care | Payer: Medicare Other | Source: Ambulatory Visit | Attending: Cardiovascular Disease | Admitting: Cardiovascular Disease

## 2022-05-28 ENCOUNTER — Ambulatory Visit (HOSPITAL_COMMUNITY)
Admission: RE | Admit: 2022-05-28 | Discharge: 2022-05-28 | Disposition: A | Discharge status: Home / Self Care | Payer: Medicare Other | Source: Ambulatory Visit | Attending: Cardiology | Admitting: Cardiology

## 2022-05-28 DIAGNOSIS — Z95828 Presence of other vascular implants and grafts: Secondary | ICD-10-CM

## 2022-05-28 DIAGNOSIS — Z95820 Peripheral vascular angioplasty status with implants and grafts: Secondary | ICD-10-CM | POA: Insufficient documentation

## 2022-05-28 DIAGNOSIS — I739 Peripheral vascular disease, unspecified: Secondary | ICD-10-CM | POA: Diagnosis not present

## 2022-05-28 DIAGNOSIS — I6523 Occlusion and stenosis of bilateral carotid arteries: Secondary | ICD-10-CM | POA: Insufficient documentation

## 2022-05-28 LAB — VAS US ABI WITH/WO TBI
Left ABI: 0.62
Right ABI: 0.97

## 2022-06-06 ENCOUNTER — Other Ambulatory Visit: Payer: Self-pay | Admitting: Family Medicine

## 2022-06-06 NOTE — Telephone Encounter (Signed)
OV 05/15/22- will continue Rx Requested Prescriptions  Pending Prescriptions Disp Refills   clopidogrel (PLAVIX) 75 MG tablet [Pharmacy Med Name: CLOPIDOGREL 75 MG TABLET] 90 tablet 0    Sig: TAKE 1 TABLET BY MOUTH EVERY DAY     Hematology: Antiplatelets - clopidogrel Failed - 06/06/2022  2:49 AM      Failed - Valid encounter within last 6 months    Recent Outpatient Visits           1 year ago Encounter for Medicare annual wellness exam   [place] [facility] Schaumann, Cammie Mcgee, MD   1 year ago Left sided sciatica   [place] Susy Frizzle, MD   2 years ago Routine general medical examination at a health care facility   [place], Modena Nunnery, MD   3 years ago Essential hypertension   [place], Modena Nunnery, MD   4 years ago Encounter for Medicare annual wellness exam   [place] Delsa Grana, PA-C       Future Appointments             In 2 months Gwenlyn Found, Pearletha Forge, MD [facility] - HCT in normal range and within 180 days    HCT  Date Value Ref Range Status  05/15/2022 49.7 38.5 - 50.0 % Final         Passed - HGB in normal range and within 180 days    Hemoglobin  Date Value Ref Range Status  05/15/2022 16.6 13.2 - 17.1 g/dL Final         Passed - PLT in normal range and within 180 days    Platelets  Date Value Ref Range Status  05/15/2022 302 140 - 400 Thousand/uL Final   Platelet Count, POC  Date Value Ref Range Status  03/11/2013 268.0 142 - 424 K/uL Final         Passed - Cr in normal range and within 360 days    Creat  Date Value Ref Range Status  05/15/2022 0.85 0.70 - 1.28 mg/dL Final

## 2022-06-13 ENCOUNTER — Telehealth: Payer: Self-pay | Admitting: Pharmacist

## 2022-06-13 NOTE — Progress Notes (Signed)
Care Management & Coordination Services Pharmacy Team   Reason for Encounter: Hypertension   Contacted patient to discuss hypertension disease state. Unsuccessful outreach. Left voicemail for patient to return call.    Current antihypertensive regimen:  Amlodipine (NORVASC) 5 MG tablet   Patient verbally confirms he is taking the above medications as directed.   How often are you checking your Blood Pressure?   he checks his blood pressure   taking his medication.  Current home BP readings:   DATE:             BP               PULSE   Wrist or arm cuff: Caffeine intake: Salt intake: OTC medications including pseudoephedrine or NSAIDs?  Any readings above 180/100?  If yes any symptoms of hypertensive emergency?   What recent interventions/DTPs have been made by any provider to improve Blood Pressure control since last CPP Visit:    Any recent hospitalizations or ED visits since last visit with CPP? Yes  What diet changes have been made to improve Blood Pressure Control?  Patient reported  What exercise is being done to improve your Blood Pressure Control?  Patient reported  Adherence Review: Is the patient currently on ACE/ARB medication?  Does the patient have >5 day gap between last estimated fill dates?   Star Rating Drugs:  Medication:    Last Fill: Day Supply  Atorvastatin 80 MG tablet     Medications: Outpatient Encounter Medications as of 06/13/2022  Medication Sig   amLODipine (NORVASC) 5 MG tablet TAKE 1 TABLET (5 MG TOTAL) BY MOUTH DAILY.   atorvastatin (LIPITOR) 80 MG tablet TAKE 1 TABLET BY MOUTH EVERY DAY   cetirizine (ZYRTEC) 10 MG tablet Take 10 mg by mouth daily as needed.    Cholecalciferol (VITAMIN D) 125 MCG (5000 UT) CAPS Take 5,000 Units by mouth daily.    clopidogrel (PLAVIX) 75 MG tablet TAKE 1 TABLET BY MOUTH EVERY DAY   Coenzyme Q10 300 MG CAPS Take 300 mg by mouth daily.    COMIRNATY SUSP injection    ezetimibe (ZETIA) 10 MG  tablet TAKE 1 TABLET BY MOUTH EVERY DAY   FLUAD QUADRIVALENT 0.5 ML injection    latanoprost (XALATAN) 0.005 % ophthalmic solution 1 drop at bedtime.   Multiple Vitamin (MULTIVITAMIN WITH MINERALS) TABS tablet Take 1 tablet by mouth daily.   polyvinyl alcohol (LIQUIFILM TEARS) 1.4 % ophthalmic solution Place 1 drop into both eyes daily as needed for dry eyes (allergies).   No facility-administered encounter medications on file as of 06/13/2022.    Recent Office Vitals: BP Readings from Last 3 Encounters:  05/15/22 136/82  02/23/22 134/76  01/29/22 130/79   Pulse Readings from Last 3 Encounters:  05/15/22 80  02/23/22 68  01/29/22 73    Wt Readings from Last 3 Encounters:  05/15/22 191 lb 3.2 oz (86.7 kg)  02/23/22 183 lb (83 kg)  01/09/22 190 lb 3.2 oz (86.3 kg)     Kidney Function Lab Results  Component Value Date/Time   CREATININE 0.85 05/15/2022 12:26 PM   CREATININE 0.84 10/06/2021 11:34 AM   GFRNONAA 90 02/14/2018 09:21 AM   GFRAA 105 02/14/2018 09:21 AM       Latest Ref Rng & Units 05/15/2022   12:26 PM 10/06/2021   11:34 AM 04/06/2021   10:57 AM  BMP  Glucose 65 - 99 mg/dL 94  91  96   BUN 7 - 25 mg/dL  $'16  16  14   'k$ Creatinine 0.70 - 1.28 mg/dL 0.85  0.84  0.85   BUN/Creat Ratio 6 - 22 (calc) SEE NOTE:  NOT APPLICABLE  NOT APPLICABLE   Sodium A999333 - 146 mmol/L 141  140  140   Potassium 3.5 - 5.3 mmol/L 5.0  4.5  4.7   Chloride 98 - 110 mmol/L 104  106  104   CO2 20 - 32 mmol/L '29  28  29   '$ Calcium 8.6 - 10.3 mg/dL 10.2  9.5  10.0      Future Appointments  Date Time Provider [place]  08/29/2022  9:45 AM Lorretta Harp, MD [facility] None    [place]

## 2022-06-27 ENCOUNTER — Other Ambulatory Visit: Payer: Self-pay

## 2022-06-27 DIAGNOSIS — Z87891 Personal history of nicotine dependence: Secondary | ICD-10-CM

## 2022-06-27 DIAGNOSIS — R0609 Other forms of dyspnea: Secondary | ICD-10-CM

## 2022-06-27 MED ORDER — TRELEGY ELLIPTA 100-62.5-25 MCG/ACT IN AEPB: 1.0000 | INHALATION_SPRAY | Freq: Every day | RESPIRATORY_TRACT | 11 refills | Status: DC

## 2022-07-12 ENCOUNTER — Other Ambulatory Visit: Payer: Self-pay | Admitting: Family Medicine

## 2022-08-02 ENCOUNTER — Other Ambulatory Visit: Payer: Self-pay | Admitting: Family Medicine

## 2022-08-02 NOTE — Telephone Encounter (Signed)
Requested Prescriptions  Pending Prescriptions Disp Refills   amLODipine (NORVASC) 5 MG tablet [Pharmacy Med Name: AMLODIPINE BESYLATE 5 MG TAB] 90 tablet 0    Sig: TAKE 1 TABLET (5 MG TOTAL) BY MOUTH DAILY.     Cardiovascular: Calcium Channel Blockers 2 Failed - 08/02/2022  1:49 AM      Failed - Valid encounter within last 6 months    Recent Outpatient Visits           1 year ago Encounter for Medicare annual wellness exam   [facility] Tanya Nones, Priscille Heidelberg, MD   1 year ago Left sided sciatica   [facility] Tanya Nones Priscille Heidelberg, MD   2 years ago Routine general medical examination at a health care facility   [facility] [place], Velna Hatchet, MD   3 years ago Essential hypertension   [facility] [place], Velna Hatchet, MD   4 years ago Encounter for [employer] annual wellness exam   [facility] Danelle Berry, PA-C       Future Appointments             In 3 weeks Runell Gess, MD [facility] - Last BP in normal range    BP Readings from Last 1 Encounters:  05/15/22 136/82         Passed - Last Heart Rate in normal range    Pulse Readings from Last 1 Encounters:  05/15/22 80

## 2022-08-29 ENCOUNTER — Encounter: Payer: Self-pay | Admitting: Cardiovascular Disease

## 2022-08-29 ENCOUNTER — Ambulatory Visit: Payer: Medicare Other | Attending: Cardiovascular Disease | Admitting: Cardiovascular Disease

## 2022-08-29 VITALS — BP 128/68 | HR 70 | Ht 71.0 in | Wt 194.0 lb

## 2022-08-29 DIAGNOSIS — E782 Mixed hyperlipidemia: Secondary | ICD-10-CM

## 2022-08-29 DIAGNOSIS — Z95828 Presence of other vascular implants and grafts: Secondary | ICD-10-CM | POA: Diagnosis not present

## 2022-08-29 DIAGNOSIS — I739 Peripheral vascular disease, unspecified: Secondary | ICD-10-CM

## 2022-08-29 DIAGNOSIS — Z87891 Personal history of nicotine dependence: Secondary | ICD-10-CM | POA: Diagnosis not present

## 2022-08-29 DIAGNOSIS — I10 Essential (primary) hypertension: Secondary | ICD-10-CM | POA: Diagnosis not present

## 2022-08-29 DIAGNOSIS — I6523 Occlusion and stenosis of bilateral carotid arteries: Secondary | ICD-10-CM

## 2022-08-29 LAB — HEPATIC FUNCTION PANEL
ALT: 30 IU/L (ref 0–44)
AST: 25 IU/L (ref 0–40)
Albumin: 4.8 g/dL (ref 3.9–4.9)
Alkaline Phosphatase: 71 IU/L (ref 44–121)
Bilirubin Total: 0.5 mg/dL (ref 0.0–1.2)
Bilirubin, Direct: 0.16 mg/dL (ref 0.00–0.40)
Total Protein: 7 g/dL (ref 6.0–8.5)

## 2022-08-29 LAB — LIPID PANEL
Chol/HDL Ratio: 2.8 ratio (ref 0.0–5.0)
Cholesterol, Total: 150 mg/dL (ref 100–199)
HDL: 53 mg/dL (ref >39)
LDL Chol Calc (NIH): 76 mg/dL (ref 0–99)
Triglycerides: 116 mg/dL (ref 0–149)
VLDL Cholesterol Cal: 21 mg/dL (ref 5–40)

## 2022-08-29 NOTE — Assessment & Plan Note (Signed)
History of remote tobacco abuse having stopped 11 years ago.

## 2022-08-29 NOTE — Assessment & Plan Note (Signed)
History of carotid and subclavian stenosis documented angiographically 03/04/2014 with a 95% ostial left common carotid stenosis and a 80% proximal left subclavian artery stenosis.  I performed left common carotid stenting on him 04/28/2014 with a 7 mm x 90 mm long Omnilink balloon expandable stent with excellent result.  His most recent Doppler studies suggest that the carotid stent is widely patent and demonstrate his left subclavian artery stenosis with a 20 mm upper extremity blood pressure differential.  He is completely asymptomatic from this.

## 2022-08-29 NOTE — Assessment & Plan Note (Signed)
History of peripheral arterial disease status post right common and external iliac artery stenting by myself 03/04/2014 with a angiographically documented left common iliac artery occlusion.  His most recent Doppler studies performed 05/28/2022 revealed moderate distal right common iliac artery stenosis/potential in-stent restenosis with a known occluded left common iliac artery.  He denies claudication.  This will be repeated on an annual basis.

## 2022-08-29 NOTE — Patient Instructions (Signed)
Medication Instructions:  Your physician recommends that you continue on your current medications as directed. Please refer to the Current Medication list given to you today.  *If you need a refill on your cardiac medications before your next appointment, please call your pharmacy*   Lab Work: Your physician recommends that you have labs drawn today: lipid/liver panel  If you have labs (blood work) drawn today and your tests are completely normal, you will receive your results only by: MyChart Message (if you have MyChart) OR A paper copy in the mail If you have any lab test that is abnormal or we need to change your treatment, we will call you to review the results.   Testing/Procedures: Your physician has requested that you have an Aorta/Iliac Duplex. This will be take place at [address], [address].  No food after 11PM the night before.  Water is OK. (Don't drink liquids if you have been instructed not to for ANOTHER test) Avoid foods that produce bowel gas, for 24 hours prior to exam (see below). No breakfast, no chewing gum, no smoking or carbonated beverages. Patient may take morning medications with water. Come in for test at least 15 minutes early to register. To do in February 2025.  Your physician has requested that you have an ankle brachial index (ABI). During this test an ultrasound and blood pressure cuff are used to evaluate the arteries that supply the arms and legs with blood. Allow thirty minutes for this exam. There are no restrictions or special instructions. This will take place at [address] [facility], [address]. **To do in February 2025**  Your physician has requested that you have a carotid duplex. This test is an ultrasound of the carotid arteries in your neck. It looks at blood flow through these arteries that supply the brain with blood. Allow one hour for this exam. There are no restrictions or special instructions. This will take place at [address] [facility] [address] [facility], [address].  **To do in February 2025**    Follow-Up: At [facility], you and your health needs are our priority.  As part of our continuing mission to provide you with exceptional heart care, we have created designated Provider Care Teams.  These Care Teams include your primary Cardiologist (physician) and Advanced Practice Providers (APPs -  Physician Assistants and Nurse Practitioners) who all work together to provide you with the care you need, when you need it.  We recommend signing up for the patient portal called "MyChart".  Sign up information is provided on this After Visit Summary.  MyChart is used to connect with patients for Virtual Visits (Telemedicine).  Patients are able to view lab/test results, encounter notes, upcoming appointments, etc.  Non-urgent messages can be sent to your provider as well.   To learn more about what you can do with MyChart, go to [email or web address].    Your next appointment:   12 month(s)  Provider:   Nanetta Batty, MD

## 2022-08-29 NOTE — Assessment & Plan Note (Signed)
History of hyperlipidemia on high-dose statin therapy and Zetia with lipid profile performed 10/06/2021 revealing total cholesterol 129, LDL 61 and HDL of 50.  We will repeat a fasting lipid liver profile today.

## 2022-08-29 NOTE — Progress Notes (Signed)
08/29/2022 Jesus Klein   Jun 05, 1950  [id number]  Primary Physician Tanya Nones Priscille Heidelberg, MD Primary Cardiologist: Runell Gess MD Nicholes Calamity, [place]  HPI:  Jesus Klein is a 72 y.o.  thin-appearing Caucasian male father of 2 children, grandfather and 2 grandchildren referred by Frazier Richards PA-C for peripheral vascular evaluation.I last saw him in the office 06/07/2021. I initially saw him in the office 05/24/2020.  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  He had Dopplers performed in the office today that showed high-grade ostial carotid, ostial/subclavian artery stenosis as well as an occluded left common iliac and high-grade right iliac stenosis. His cardiovascular risk factor profile is notable for a 30-pack-year history of tobacco abuse having stopped smoking 2 years ago as well as 2 hypertension. There is no family history of heart disease. He does have mild hyperlipidemia. He has never had a heart attack or stroke. Denies chest pain or shortness of breath. He did have a right total hip replacement performed by Dr. Lajoyce Corners in 2007. He noticed lifestyle limiting claudication 6 months ago right greater than left. On 03/04/14 I performed aortic arch angiography, selective left carotid angiography, abdominal aortography as well as right common iliac and external iliac intervention. His right lower extremity claudication has completely resolved. His ABI has normalized. He does not notice claudication as much in his left leg. Because of his high grade near ostial left common carotid artery stenosis heunderwent PTCA and stenting on 04/28/14 with a 7 mm x 19 mm long Omnilink balloon expandable stent with excellent result. Follow-up lower extremity Dopplers and carotid Dopplers performed 05/23/17 suggested in-stent restenosis within the iliac stents are widely patent carotid stent.  His most recent Dopplers  performed in January 2020 revealed patent carotid stent, and moderate in-stent restenosis within the right iliac stent although he denies claudication.   Since I saw him in the office a year ago he continues to do well.  He denies claudication, chest pain or shortness of breath.  His most recent carotid Dopplers suggest patency of his left carotid stent.  Does have a high-frequency signal in his left subclavian artery with symmetric upper extremity blood pressures.  His aortoiliac Dopplers show occlusion of his left common iliac artery which was known from angiography in 2015 with progression of disease in his right external iliac artery.   Current Meds  Medication Sig   amLODipine (NORVASC) 5 MG tablet TAKE 1 TABLET (5 MG TOTAL) BY MOUTH DAILY.   atorvastatin (LIPITOR) 80 MG tablet TAKE 1 TABLET BY MOUTH EVERY DAY   cetirizine (ZYRTEC) 10 MG tablet Take 10 mg by mouth daily as needed.    Cholecalciferol (VITAMIN D) 125 MCG (5000 UT) CAPS Take 5,000 Units by mouth daily.    clopidogrel (PLAVIX) 75 MG tablet TAKE 1 TABLET BY MOUTH EVERY DAY   Coenzyme Q10 300 MG CAPS Take 300 mg by mouth daily.    ezetimibe (ZETIA) 10 MG tablet TAKE 1 TABLET BY MOUTH EVERY DAY   Fluticasone-Umeclidin-Vilant (TRELEGY ELLIPTA) 100-62.5-25 MCG/ACT AEPB Inhale 1 puff into the lungs daily.   latanoprost (XALATAN) 0.005 % ophthalmic solution 1 drop at bedtime.   Multiple Vitamin (MULTIVITAMIN WITH MINERALS) TABS tablet Take 1 tablet by mouth daily.   polyvinyl alcohol (LIQUIFILM TEARS) 1.4 % ophthalmic solution Place 1 drop into both eyes daily as needed for  dry eyes (allergies).     No Known Allergies  Social History   Socioeconomic History   Marital status: Married    Spouse name: Clydie Braun   Number of children: 2   Years of education: Not on file   Highest education level: Not on file  Occupational History   Not on file  Tobacco Use   Smoking status: Former    Packs/day: 1.50    Years: 30.00    Additional  pack years: 0.00    Total pack years: 45.00    Types: Cigarettes    Quit date: 01/19/2009    Years since quitting: 13.6   Smokeless tobacco: Never  Vaping Use   Vaping Use: Never used  Substance and Sexual Activity   Alcohol use: Yes    Alcohol/week: 2.0 standard drinks of alcohol    Types: 2 Cans of beer per week   Drug use: No   Sexual activity: Yes    Birth control/protection: None  Other Topics Concern   Not on file  Social History Narrative   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 He may occasionally lift 20 pounds but most is done by machine.    Mix of standing, sitting, moving around.      Used to walk a lot and do a lot outside. Got a treadmill to use during cold weather.   Married.   2 daughters- grown.         Social Determinants of Health   Financial Resource Strain: Low Risk  (02/23/2022)   Overall Financial Resource Strain (CARDIA)    Difficulty of Paying Living Expenses: Not very hard  Food Insecurity: No Food Insecurity (02/23/2022)   Hunger Vital Sign    Worried About Running Out of Food in the Last Year: Never true    Ran Out of Food in the Last Year: Never true  Transportation Needs: No Transportation Needs (02/23/2021)   PRAPARE - Administrator, Civil Service (Medical): No    Lack of Transportation (Non-Medical): No  Physical Activity: Sufficiently Active (02/23/2022)   Exercise Vital Sign    Days of Exercise per Week: 7 days    Minutes of Exercise per Session: 50 min  Stress: No Stress Concern Present (02/23/2021)   [employer] of Occupational Health - Occupational Stress Questionnaire    Feeling of Stress : Not at all  Social Connections: Socially Integrated (02/23/2021)   Social Connection and Isolation Panel [NHANES]    Frequency of Communication with Friends and Family: More than three times a week    Frequency of Social Gatherings with Friends and Family: More than three times  a week    Attends Religious Services: More than 4 times per year    Active Member of [employer] or Organizations: Yes    Attends Engineer, structural: More than 4 times per year    Marital Status: Married  Catering manager Violence: Not At Risk (02/23/2021)   Humiliation, Afraid, Rape, and Kick questionnaire    Fear of Current or Ex-Partner: No    Emotionally Abused: No    Physically Abused: No    Sexually Abused: No     Review of Systems: General: negative for chills, fever, night sweats or weight changes.  Cardiovascular: negative for chest pain, dyspnea on exertion, edema, orthopnea, palpitations, paroxysmal nocturnal dyspnea or shortness of breath Dermatological: negative for rash Respiratory: negative for cough or wheezing Urologic: negative for  hematuria Abdominal: negative for nausea, vomiting, diarrhea, bright red blood per rectum, melena, or hematemesis Neurologic: negative for visual changes, syncope, or dizziness All other systems reviewed and are otherwise negative except as noted above.    Blood pressure 128/68, pulse 70, height 5\' 11"  (1.803 m), weight 194 lb (88 kg).  General appearance: alert and no distress Neck: no adenopathy, no JVD, supple, symmetrical, trachea midline, thyroid not enlarged, symmetric, no tenderness/mass/nodules, and bilateral carotid bruits, left subclavian bruit. Lungs: clear to auscultation bilaterally Heart: Soft outflow tract murmur consistent with aortic stenosis and/or sclerosis. Extremities: extremities normal, atraumatic, no cyanosis or edema Pulses: Radial pulses bilaterally Skin: Skin color, texture, turgor normal. No rashes or lesions Neurologic: Grossly normal  EKG not performed today  ASSESSMENT AND PLAN:   Essential hypertension History of essential hypertension with blood pressure measured today at 128/68.  He is on amlodipine.  Hyperlipidemia History of hyperlipidemia on high-dose statin therapy and Zetia with lipid  profile performed 10/06/2021 revealing total cholesterol 129, LDL 61 and HDL of 50.  We will repeat a fasting lipid liver profile today.  Claudication of lower extremity (HCC) History of peripheral arterial disease status post right common and external iliac artery stenting by myself 03/04/2014 with a angiographically documented left common iliac artery occlusion.  His most recent Doppler studies performed 05/28/2022 revealed moderate distal right common iliac artery stenosis/potential in-stent restenosis with a known occluded left common iliac artery.  He denies claudication.  This will be repeated on an annual basis.  PVD - Carotid, subclavian, bilat LE disease History of carotid and subclavian stenosis documented angiographically 03/04/2014 with a 95% ostial left common carotid stenosis and a 80% proximal left subclavian artery stenosis.  I performed left common carotid stenting on him 04/28/2014 with a 7 mm x 90 mm long Omnilink balloon expandable stent with excellent result.  His most recent Doppler studies suggest that the carotid stent is widely patent and demonstrate his left subclavian artery stenosis with a 20 mm upper extremity blood pressure differential.  He is completely asymptomatic from this.  History of tobacco abuse History of remote tobacco abuse having stopped 11 years ago.     Runell Gess MD FACP,FACC,FAHA, [facility] 08/29/2022 10:12 AM

## 2022-08-29 NOTE — Assessment & Plan Note (Signed)
History of essential hypertension with blood pressure measured today at 128/68.  He is on amlodipine.

## 2022-09-02 ENCOUNTER — Other Ambulatory Visit: Payer: Self-pay | Admitting: Family Medicine

## 2022-09-03 NOTE — Telephone Encounter (Signed)
Requested Prescriptions  Pending Prescriptions Disp Refills   clopidogrel (PLAVIX) 75 MG tablet [Pharmacy Med Name: CLOPIDOGREL 75 MG TABLET] 90 tablet 0    Sig: TAKE 1 TABLET BY MOUTH EVERY DAY     Hematology: Antiplatelets - clopidogrel Failed - 09/02/2022  9:24 AM      Failed - Valid encounter within last 6 months    Recent Outpatient Visits           1 year ago Encounter for Medicare annual wellness exam   [facility] [place], Priscille Heidelberg, MD   1 year ago Left sided sciatica   [facility] Donita Brooks, MD   2 years ago Routine general medical examination at a health care facility   [facility] [place], Velna Hatchet, MD   3 years ago Essential hypertension   [facility] [place], Velna Hatchet, MD   4 years ago Encounter for Medicare annual wellness exam   [facility] Danelle Berry, PA-C              Passed - HCT in normal range and within 180 days    HCT  Date Value Ref Range Status  05/15/2022 49.7 38.5 - 50.0 % Final         Passed - HGB in normal range and within 180 days    Hemoglobin  Date Value Ref Range Status  05/15/2022 16.6 13.2 - 17.1 g/dL Final         Passed - PLT in normal range and within 180 days    Platelets  Date Value Ref Range Status  05/15/2022 302 140 - 400 Thousand/uL Final   Platelet Count, POC  Date Value Ref Range Status  03/11/2013 268.0 142 - 424 K/uL Final         Passed - Cr in normal range and within 360 days    Creat  Date Value Ref Range Status  05/15/2022 0.85 0.70 - 1.28 mg/dL Final

## 2022-10-28 ENCOUNTER — Other Ambulatory Visit: Payer: Self-pay | Admitting: Family Medicine

## 2022-10-29 NOTE — Telephone Encounter (Signed)
Due to a system glitch the protocol does not detect the last office visit for this practice.   LOV 05/15/2022.  Requested Prescriptions  Pending Prescriptions Disp Refills   amLODipine (NORVASC) 5 MG tablet [Pharmacy Med Name: AMLODIPINE BESYLATE 5 MG TAB] 90 tablet 0    Sig: TAKE 1 TABLET (5 MG TOTAL) BY MOUTH DAILY.     Cardiovascular: Calcium Channel Blockers 2 Failed - 10/28/2022  8:49 AM      Failed - Valid encounter within last 6 months    Recent Outpatient Visits           1 year ago Encounter for Medicare annual wellness exam   [facility] Tanya Nones, Priscille Heidelberg, MD   1 year ago Left sided sciatica   [facility] Donita Brooks, MD   2 years ago Routine general medical examination at a health care facility   [facility] [place], Velna Hatchet, MD   3 years ago Essential hypertension   [facility] [place], Velna Hatchet, MD   4 years ago Encounter for [employer] annual wellness exam   [facility] Danelle Berry, PA-C              Passed - Last BP in normal range    BP Readings from Last 1 Encounters:  08/29/22 128/68         Passed - Last Heart Rate in normal range    Pulse Readings from Last 1 Encounters:  08/29/22 70

## 2022-12-01 ENCOUNTER — Other Ambulatory Visit: Payer: Self-pay | Admitting: Family Medicine

## 2022-12-03 NOTE — Telephone Encounter (Signed)
Last OV 05/15/22 Requested Prescriptions  Pending Prescriptions Disp Refills   clopidogrel (PLAVIX) 75 MG tablet [Pharmacy Med Name: CLOPIDOGREL 75 MG TABLET] 90 tablet 0    Sig: TAKE 1 TABLET BY MOUTH EVERY DAY     Hematology: Antiplatelets - clopidogrel Failed - 12/01/2022  8:47 AM      Failed - HCT in normal range and within 180 days    HCT  Date Value Ref Range Status  05/15/2022 49.7 38.5 - 50.0 % Final         Failed - HGB in normal range and within 180 days    Hemoglobin  Date Value Ref Range Status  05/15/2022 16.6 13.2 - 17.1 g/dL Final         Failed - PLT in normal range and within 180 days    Platelets  Date Value Ref Range Status  05/15/2022 302 140 - 400 Thousand/uL Final   Platelet Count, POC  Date Value Ref Range Status  03/11/2013 268.0 142 - 424 K/uL Final         Failed - Valid encounter within last 6 months    Recent Outpatient Visits           1 year ago Encounter for Medicare annual wellness exam   [facility] Donita Brooks, MD   2 years ago Left sided sciatica   [facility] Donita Brooks, MD   2 years ago Routine general medical examination at a health care facility   [facility] [place], Velna Hatchet, MD   3 years ago Essential hypertension   [facility] [place], Velna Hatchet, MD   4 years ago Encounter for Medicare annual wellness exam   [facility] Danelle Berry, PA-C              Passed - Cr in normal range and within 360 days    Creat  Date Value Ref Range Status  05/15/2022 0.85 0.70 - 1.28 mg/dL Final

## 2022-12-25 ENCOUNTER — Encounter: Payer: Self-pay | Admitting: Internal Medicine

## 2023-01-15 ENCOUNTER — Other Ambulatory Visit: Payer: Self-pay | Admitting: Family Medicine

## 2023-01-16 NOTE — Telephone Encounter (Signed)
Request is too soon for refill. Last refill 07/12/22 for 90 and 2 refills. OV needed for additional refills.  Requested Prescriptions  Pending Prescriptions Disp Refills   atorvastatin (LIPITOR) 80 MG tablet [Pharmacy Med Name: ATORVASTATIN 80 MG TABLET] 90 tablet 2    Sig: TAKE 1 TABLET BY MOUTH EVERY DAY     Cardiovascular:  Antilipid - Statins Failed - 01/15/2023 10:56 AM      Failed - Valid encounter within last 12 months    Recent Outpatient Visits           1 year ago Encounter for Medicare annual wellness exam   [facility] Donita Brooks, MD   2 years ago Left sided sciatica   [facility] Donita Brooks, MD   2 years ago Routine general medical examination at a health care facility   [facility] [place], Velna Hatchet, MD   3 years ago Essential hypertension   [facility] [place], Velna Hatchet, MD   4 years ago Encounter for Medicare annual wellness exam   [facility] Danelle Berry, PA-C              Failed - Lipid Panel in normal range within the last 12 months    Cholesterol, Total  Date Value Ref Range Status  08/29/2022 150 100 - 199 mg/dL Final   LDL Cholesterol (Calc)  Date Value Ref Range Status  10/06/2021 61 mg/dL (calc) Final    Comment:    Reference range: <100 . Desirable range <100 mg/dL for primary prevention;   <70 mg/dL for patients with CHD or diabetic patients  with > or = 2 CHD risk factors. Marland Kitchen LDL-C is now calculated using the Martin-Hopkins  calculation, which is a validated novel method providing  better accuracy than the Friedewald equation in the  estimation of LDL-C.  Horald Pollen et al. Lenox Ahr. 0865;784(69): 2061-2068  (http://education.[facility].com/faq/FAQ164)    LDL Chol Calc (NIH)  Date Value Ref Range Status  08/29/2022 76 0 - 99 mg/dL Final   HDL  Date Value Ref Range Status  08/29/2022 53 >39 mg/dL Final   Triglycerides  Date Value Ref  Range Status  08/29/2022 116 0 - 149 mg/dL Final         Passed - Patient is not pregnant

## 2023-01-27 ENCOUNTER — Other Ambulatory Visit: Payer: Self-pay | Admitting: Family Medicine

## 2023-03-04 ENCOUNTER — Other Ambulatory Visit: Payer: Self-pay

## 2023-03-04 ENCOUNTER — Telehealth: Payer: Self-pay

## 2023-03-04 DIAGNOSIS — I6522 Occlusion and stenosis of left carotid artery: Secondary | ICD-10-CM

## 2023-03-04 DIAGNOSIS — R0609 Other forms of dyspnea: Secondary | ICD-10-CM

## 2023-03-04 DIAGNOSIS — I739 Peripheral vascular disease, unspecified: Secondary | ICD-10-CM

## 2023-03-04 MED ORDER — CLOPIDOGREL BISULFATE 75 MG PO TABS: 75.0000 mg | ORAL_TABLET | Freq: Every day | ORAL | 0 refills | Status: DC

## 2023-03-04 NOTE — Telephone Encounter (Signed)
Prescription Request  03/04/2023  LOV: 05/15/22  What is the name of the medication or equipment? clopidogrel (PLAVIX) 75 MG tablet [[id number]]  Have you contacted your pharmacy to request a refill? Yes   Which pharmacy would you like this sent to?  [id number] Hassell Halim [address] [place] [address] Phone: [phone] Fax: [phone]    Patient notified that their request is being sent to the clinical staff for review and that they should receive a response within 2 business days.   Please advise at [facility] [phone]

## 2023-03-18 ENCOUNTER — Telehealth: Payer: Self-pay

## 2023-03-18 NOTE — Telephone Encounter (Signed)
Copied from CRM [id number]. Topic: Appointments - Appointment Scheduling >> Mar 15, 2023  9:47 AM Hector Shade B wrote: Patient/patient representative is calling to schedule an appointment. Refer to attachments for appointment information  Pt scheduled in January.

## 2023-03-18 NOTE — Telephone Encounter (Signed)
Copied from CRM [id number]. Topic: Appointments - Appointment Scheduling >> Mar 15, 2023  9:47 AM Hector Shade B wrote: Patient/patient representative is calling to schedule an appointment. Refer to attachments for appointment information.

## 2023-04-18 ENCOUNTER — Other Ambulatory Visit: Payer: Self-pay | Admitting: Family Medicine

## 2023-04-18 NOTE — Telephone Encounter (Signed)
Prescription Request  04/18/2023  LOV: 05/15/2022  What is the name of the medication or equipment?   ezetimibe (ZETIA) 10 MG tablet **DAY SCRIPT REQUESTED**  Have you contacted your pharmacy to request a refill? Yes   Which pharmacy would you like this sent to?  [id number] Hassell Halim [address] [place] [address] Phone: [phone] Fax: [phone]    Patient notified that their request is being sent to the clinical staff for review and that they should receive a response within 2 business days.   Please advise pharmacist.

## 2023-04-22 MED ORDER — EZETIMIBE 10 MG PO TABS: 10.0000 mg | ORAL_TABLET | Freq: Every day | ORAL | 0 refills | Status: DC

## 2023-04-22 NOTE — Telephone Encounter (Signed)
Requested Prescriptions  Pending Prescriptions Disp Refills   ezetimibe (ZETIA) 10 MG tablet 90 tablet 3    Sig: Take 1 tablet (10 mg total) by mouth daily.     Cardiovascular:  Antilipid - Sterol Transport Inhibitors Failed - 04/22/2023  7:52 AM      Failed - Valid encounter within last 12 months    Recent Outpatient Visits           2 years ago Encounter for Medicare annual wellness exam   [facility] Donita Brooks, MD   2 years ago Left sided sciatica   [facility] Tanya Nones Priscille Heidelberg, MD   3 years ago Routine general medical examination at a health care facility   [facility] [place], Velna Hatchet, MD   4 years ago Essential hypertension   [facility] [place], Velna Hatchet, MD   5 years ago Encounter for Medicare annual wellness exam   [facility] Danelle Berry, PA-C              Failed - Lipid Panel in normal range within the last 12 months    Cholesterol, Total  Date Value Ref Range Status  08/29/2022 150 100 - 199 mg/dL Final   LDL Cholesterol (Calc)  Date Value Ref Range Status  10/06/2021 61 mg/dL (calc) Final    Comment:    Reference range: <100 . Desirable range <100 mg/dL for primary prevention;   <70 mg/dL for patients with CHD or diabetic patients  with > or = 2 CHD risk factors. Marland Kitchen LDL-C is now calculated using the Martin-Hopkins  calculation, which is a validated novel method providing  better accuracy than the Friedewald equation in the  estimation of LDL-C.  Horald Pollen et al. Lenox Ahr. 1610;960(45): 2061-2068  (http://education.[facility].com/faq/FAQ164)    LDL Chol Calc (NIH)  Date Value Ref Range Status  08/29/2022 76 0 - 99 mg/dL Final   HDL  Date Value Ref Range Status  08/29/2022 53 >39 mg/dL Final   Triglycerides  Date Value Ref Range Status  08/29/2022 116 0 - 149 mg/dL Final         Passed - AST in normal range and within 360 days    AST  Date Value  Ref Range Status  08/29/2022 25 0 - 40 IU/L Final         Passed - ALT in normal range and within 360 days    ALT  Date Value Ref Range Status  08/29/2022 30 0 - 44 IU/L Final         Passed - Patient is not pregnant

## 2023-04-25 ENCOUNTER — Ambulatory Visit (INDEPENDENT_AMBULATORY_CARE_PROVIDER_SITE_OTHER): Payer: Medicare Other | Admitting: Family Medicine

## 2023-04-25 VITALS — BP 126/78 | HR 88 | Temp 97.8°F | Ht 71.0 in | Wt 187.0 lb

## 2023-04-25 DIAGNOSIS — Z0001 Encounter for general adult medical examination with abnormal findings: Secondary | ICD-10-CM

## 2023-04-25 DIAGNOSIS — E782 Mixed hyperlipidemia: Secondary | ICD-10-CM

## 2023-04-25 DIAGNOSIS — I6522 Occlusion and stenosis of left carotid artery: Secondary | ICD-10-CM

## 2023-04-25 DIAGNOSIS — Z0000 Encounter for general adult medical examination without abnormal findings: Secondary | ICD-10-CM

## 2023-04-25 DIAGNOSIS — I739 Peripheral vascular disease, unspecified: Secondary | ICD-10-CM | POA: Diagnosis not present

## 2023-04-25 DIAGNOSIS — J439 Emphysema, unspecified: Secondary | ICD-10-CM

## 2023-04-25 DIAGNOSIS — I10 Essential (primary) hypertension: Secondary | ICD-10-CM

## 2023-04-25 DIAGNOSIS — Z87891 Personal history of nicotine dependence: Secondary | ICD-10-CM

## 2023-04-25 DIAGNOSIS — Z23 Encounter for immunization: Secondary | ICD-10-CM | POA: Diagnosis not present

## 2023-04-25 MED ORDER — FLUTICASONE-SALMETEROL 250-50 MCG/ACT IN AEPB: 1.0000 | INHALATION_SPRAY | Freq: Two times a day (BID) | RESPIRATORY_TRACT | 11 refills | Status: DC

## 2023-04-25 NOTE — Progress Notes (Signed)
 Subjective:    Patient ID: Jesus Klein, male    DOB: August 01, 1950, 73 y.o.   MRN: [id number]  Patient is a very pleasant 73 year old Caucasian gentleman who has a history of left-sided carotid artery disease and right-sided carotid artery disease.  However he had carotid Dopplers performed in 2024 that showed 1 to 39% internal carotid artery stenosis bilaterally.  He has known peripheral artery disease.  Please see the past medical history.  This included high-grade ostial left common carotid artery stenosis that required a stent.  He also had right common iliac and external iliac stenting.  He is currently on Plavix .  Last colonoscopy 2021 found eight polyps.  Recommended repeat in 2024.  Patient is scheduled his colonoscopy for February.  Patient states he quit smoking 14 years ago.  We discussed lung cancer screening but he declines this at the present time.  He is due for the new pneumonia vaccine.  He is also due for a shingles shot.  He has had his RSV, COVID, and flu shot.  He can no longer afford Trelegy.  He does report cough and wheezing and chest congestion on a daily basis. Immunization History  Administered Date(s) Administered   Fluad Quad(high Dose 65+) 12/23/2018, 02/05/2020   Influenza,inj,Quad PF,6+ Mos 02/02/2014   Influenza-Unspecified 03/03/2018, 01/16/2021   PFIZER(Purple Top)SARS-COV-2 Vaccination 06/16/2019, 07/07/2019, 03/01/2020, 01/16/2021   Pneumococcal Conjugate-13 04/23/2016   Pneumococcal Polysaccharide-23 04/23/2017   Tdap 09/02/2010   Zoster, Live 11/07/2015    Past Medical History:  Diagnosis Date   Allergy    seasonal allergies   Arthritis    arms/shoulders   Blood transfusion without reported diagnosis 2009   with hip replacement   History of tobacco abuse    Hyperlipidemia    on meds   Hypertension 2010   on meds   Left-sided carotid artery disease (HCC)    high-grade ostial left common carotid artery stenosis   Peripheral arterial disease (HCC)     a. 02/2014 - right common iliac and external iliac intervention. b. H/o PTA/stenting of left common carotid artery stenosis. Known 80% left subclavian artery stenosis without UE claudication/steal sx.   Subclavian artery stenosis, left [facility])    Past Surgical History:  Procedure Laterality Date   CAROTID STENT INSERTION N/A 04/28/2014   Procedure: CAROTID STENT INSERTION;  Surgeon: Gaile LELON New, MD;  Location: [facility] C[facility] LAB;  Service: Cardiovascular;  Laterality: N/A;   ILIAC ARTERY STENT Right 03/04/2014   w/as well as right external iliac and common femoral artery stenting/notes 03/04/2014   JOINT REPLACEMENT     LOWER EXTREMITY ANGIOGRAM N/A 03/04/2014   Procedure: LOWER EXTREMITY ANGIOGRAM;  Surgeon: Dorn JINNY Lesches, MD;  Location: [facility] CATH LAB;  Service: Cardiovascular;  Laterality: N/A;   TOTAL HIP ARTHROPLASTY Right 10/2005   WISDOM TOOTH EXTRACTION     Current Outpatient Medications on File Prior to Visit  Medication Sig Dispense Refill   amLODipine  (NORVASC ) 5 MG tablet TAKE 1 TABLET (5 MG TOTAL) BY MOUTH DAILY. 90 tablet 0   atorvastatin  (LIPITOR ) 80 MG tablet TAKE 1 TABLET BY MOUTH EVERY DAY 90 tablet 2   cetirizine (ZYRTEC) 10 MG tablet Take 10 mg by mouth daily as needed.      Cholecalciferol (VITAMIN D ) 125 MCG (5000 UT) CAPS Take 5,000 Units by mouth daily.      clopidogrel  (PLAVIX ) 75 MG tablet Take 1 tablet (75 mg total) by mouth daily. 90 tablet 0   Coenzyme Q10 300  MG CAPS Take 300 mg by mouth daily.      ezetimibe  (ZETIA ) 10 MG tablet Take 1 tablet (10 mg total) by mouth daily. 90 tablet 0   Fluticasone -Umeclidin-Vilant (TRELEGY ELLIPTA ) 100-62.5-25 MCG/ACT AEPB Inhale 1 puff into the lungs daily. 1 each 11   latanoprost (XALATAN) 0.005 % ophthalmic solution 1 drop at bedtime.     Multiple Vitamin (MULTIVITAMIN WITH MINERALS) TABS tablet Take 1 tablet by mouth daily.     polyvinyl alcohol (LIQUIFILM TEARS) 1.4 % ophthalmic solution Place 1 drop into both eyes daily as  needed for dry eyes (allergies).     No current facility-administered medications on file prior to visit.   No Known Allergies Social History   Socioeconomic History   Marital status: Married    Spouse name: Darice   Number of children: 2   Years of education: Not on file   Highest education level: Bachelor's degree (e.g., BA, AB, BS)  Occupational History   Not on file  Tobacco Use   Smoking status: Former    Current packs/day: 0.00    Average packs/day: 1.5 packs/day for 30.0 years (45.0 ttl pk-yrs)    Types: Cigarettes    Start date: 01/20/1979    Quit date: 01/19/2009    Years since quitting: 14.2   Smokeless tobacco: Never  Vaping Use   Vaping status: Never Used  Substance and Sexual Activity   Alcohol use: Yes    Alcohol/week: 2.0 standard drinks of alcohol    Types: 2 Cans of beer per week   Drug use: No   Sexual activity: Yes    Birth control/protection: None  Other Topics Concern   Not on file  Social History Narrative   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 He may occasionally lift 20 pounds but most is done by machine.    Mix of standing, sitting, moving around.      Used to walk a lot and do a lot outside. Got a treadmill to use during cold weather.   Married.   2 daughters- grown.         Social Drivers of Corporate Investment Banker Strain: Low Risk  (04/18/2023)   Overall Financial Resource Strain (CARDIA)    Difficulty of Paying Living Expenses: Not hard at all  Food Insecurity: No Food Insecurity (04/18/2023)   Hunger Vital Sign    Worried About Running Out of Food in the Last Year: Never true    Ran Out of Food in the Last Year: Never true  Transportation Needs: No Transportation Needs (04/18/2023)   PRAPARE - Administrator, Civil Service (Medical): No    Lack of Transportation (Non-Medical): No  Physical Activity: Sufficiently Active (04/18/2023)   Exercise Vital Sign     Days of Exercise per Week: 6 days    Minutes of Exercise per Session: 30 min  Stress: No Stress Concern Present (04/18/2023)   [employer] of Occupational Health - Occupational Stress Questionnaire    Feeling of Stress : Only a little  Social Connections: Moderately Isolated (04/18/2023)   Social Connection and Isolation Panel [NHANES]    Frequency of Communication with Friends and Family: Once a week    Frequency of Social Gatherings with Friends and Family: Once a week    Attends Religious Services: More than 4 times per year    Active Member of Clubs or Organizations: No    Attends  Club or Organization Meetings: Not on file    Marital Status: Married  Intimate Partner Violence: Not At Risk (02/23/2021)   Humiliation, Afraid, Rape, and Kick questionnaire    Fear of Current or Ex-Partner: No    Emotionally Abused: No    Physically Abused: No    Sexually Abused: No     Review of Systems  All other systems reviewed and are negative.      Objective:   Physical Exam Vitals reviewed.  Constitutional:      General: He is not in acute distress.    Appearance: Normal appearance. He is normal weight. He is not ill-appearing, toxic-appearing or diaphoretic.  Neck:     Vascular: Carotid bruit present.  Cardiovascular:     Rate and Rhythm: Normal rate and regular rhythm.     Pulses: Normal pulses.     Heart sounds: Normal heart sounds.     No friction rub. No gallop.  Pulmonary:     Effort: Pulmonary effort is normal. No respiratory distress.     Breath sounds: No stridor. Wheezing present. No rhonchi or rales.  Chest:     Chest wall: No tenderness.  Abdominal:     General: Abdomen is flat. Bowel sounds are normal. There is no distension.     Palpations: Abdomen is soft. There is no mass.     Tenderness: There is no abdominal tenderness. There is no right CVA tenderness, left CVA tenderness, guarding or rebound.     Hernia: No hernia is present.  Musculoskeletal:      Cervical back: Normal range of motion and neck supple. No rigidity or tenderness.     Right lower leg: No edema.     Left lower leg: No edema.  Lymphadenopathy:     Cervical: No cervical adenopathy.  Skin:    Findings: No lesion or rash.  Neurological:     General: No focal deficit present.     Mental Status: He is alert and oriented to person, place, and time. Mental status is at baseline.     Cranial Nerves: No cranial nerve deficit.     Motor: No weakness.     Gait: Gait normal.  Psychiatric:        Mood and Affect: Mood normal.        Behavior: Behavior normal.        Thought Content: Thought content normal.        Judgment: Judgment normal.           Assessment & Plan:   Essential hypertension - Plan: CBC with Differential/Platelet, COMPLETE METABOLIC PANEL WITH GFR, Lipid panel, PSA  PAD (peripheral artery disease) (HCC) - Plan: CBC with Differential/Platelet, COMPLETE METABOLIC PANEL WITH GFR, Lipid panel, PSA  History of tobacco abuse - Plan: CBC with Differential/Platelet, COMPLETE METABOLIC PANEL WITH GFR, Lipid panel, PSA  Stenosis of left carotid artery - Plan: CBC with Differential/Platelet, COMPLETE METABOLIC PANEL WITH GFR, Lipid panel, PSA  Mixed hyperlipidemia - Plan: CBC with Differential/Platelet, COMPLETE METABOLIC PANEL WITH GFR, Lipid panel, PSA  General medical exam - Plan: CBC with Differential/Platelet, COMPLETE METABOLIC PANEL WITH GFR, Lipid panel, PSA  Pulmonary emphysema, unspecified emphysema type (HCC) For cost reasons, we will switch the patient from Trelegy to generic Advair 250/50 puff twice daily.  Patient will see if this helps.  Encouraged him to use this inhaler daily.  He was using Trelegy sporadically.  Patient received Capvaxive and Shingrix  today.  Colonoscopy has been scheduled for February.  I will check a PSA to screen for prostate cancer.  We discussed getting a CT scan of the lungs to screen for lung cancer but the patient  politely declined.  I will check a CBC a CMP and a lipid panel.  I would like to see his LDL cholesterol less than 55 given his extensive vascular history.  AAA screening is up-to-date.  Cardiology monitors carotid Dopplers and peripheral vascular Dopplers on a yearly basis.  Blood pressure is outstanding.

## 2023-04-26 LAB — COMPLETE METABOLIC PANEL WITH GFR
AG Ratio: 1.8 (calc) (ref 1.0–2.5)
ALT: 29 U/L (ref 9–46)
AST: 23 U/L (ref 10–35)
Albumin: 4.6 g/dL (ref 3.6–5.1)
Alkaline phosphatase (APISO): 69 U/L (ref 35–144)
BUN: 22 mg/dL (ref 7–25)
CO2: 26 mmol/L (ref 20–32)
Calcium: 9.8 mg/dL (ref 8.6–10.3)
Chloride: 103 mmol/L (ref 98–110)
Creat: 1.06 mg/dL (ref 0.70–1.28)
Globulin: 2.5 g/dL (ref 1.9–3.7)
Glucose, Bld: 87 mg/dL (ref 65–99)
Potassium: 4.3 mmol/L (ref 3.5–5.3)
Sodium: 140 mmol/L (ref 135–146)
Total Bilirubin: 0.5 mg/dL (ref 0.2–1.2)
Total Protein: 7.1 g/dL (ref 6.1–8.1)
eGFR: 75 mL/min/{1.73_m2} (ref >=60)

## 2023-04-26 LAB — CBC WITH DIFFERENTIAL/PLATELET
Absolute Lymphocytes: 2094 {cells}/uL (ref 850–3900)
Absolute Monocytes: 714 {cells}/uL (ref 200–950)
Basophils Absolute: 18 {cells}/uL (ref 0–200)
Basophils Relative: 0.3 %
Eosinophils Absolute: 270 {cells}/uL (ref 15–500)
Eosinophils Relative: 4.5 %
HCT: 52.3 % — ABNORMAL HIGH (ref 38.5–50.0)
Hemoglobin: 16.5 g/dL (ref 13.2–17.1)
MCH: 28.3 pg (ref 27.0–33.0)
MCHC: 31.5 g/dL — ABNORMAL LOW (ref 32.0–36.0)
MCV: 89.7 fL (ref 80.0–100.0)
MPV: 10.7 fL (ref 7.5–12.5)
Monocytes Relative: 11.9 %
Neutro Abs: 2904 {cells}/uL (ref 1500–7800)
Neutrophils Relative %: 48.4 %
Platelets: 290 10*3/uL (ref 140–400)
RBC: 5.83 10*6/uL — ABNORMAL HIGH (ref 4.20–5.80)
RDW: 12.5 % (ref 11.0–15.0)
Total Lymphocyte: 34.9 %
WBC: 6 10*3/uL (ref 3.8–10.8)

## 2023-04-26 LAB — PSA: PSA: 1.88 ng/mL (ref <=4.00)

## 2023-04-26 LAB — LIPID PANEL
Cholesterol: 122 mg/dL (ref <200)
HDL: 44 mg/dL (ref >=40)
LDL Cholesterol (Calc): 59 mg/dL
Non-HDL Cholesterol (Calc): 78 mg/dL (ref <130)
Total CHOL/HDL Ratio: 2.8 (calc) (ref <5.0)
Triglycerides: 103 mg/dL (ref <150)

## 2023-04-29 ENCOUNTER — Other Ambulatory Visit: Payer: Self-pay

## 2023-04-29 NOTE — Telephone Encounter (Signed)
 Prescription Request  04/29/2023  LOV: 04/25/23 CPE  What is the name of the medication or equipment? amLODipine  (NORVASC ) 5 MG tablet [[id number]]   Have you contacted your pharmacy to request a refill? Yes   Which pharmacy would you like this sent to?  [id number] [place] [address] [place] [address] Phone: [phone] Fax: [phone]    Patient notified that their request is being sent to the clinical staff for review and that they should receive a response within 2 business days.   Please advise at [facility] [phone]

## 2023-05-02 MED ORDER — AMLODIPINE BESYLATE 5 MG PO TABS: 5.0000 mg | ORAL_TABLET | Freq: Every day | ORAL | 1 refills | Status: DC

## 2023-05-02 NOTE — Telephone Encounter (Signed)
 LOV 04/25/2023  Requested Prescriptions  Pending Prescriptions Disp Refills   amLODipine  (NORVASC ) 5 MG tablet 90 tablet 1    Sig: Take 1 tablet (5 mg total) by mouth daily.     Cardiovascular: Calcium  Channel Blockers 2 Failed - 05/02/2023  8:13 AM      Failed - Valid encounter within last 6 months    Recent Outpatient Visits           2 years ago Encounter for Medicare annual wellness exam   [facility] Duanne Butler DASEN, MD   2 years ago Left sided sciatica   [facility] Duanne Butler DASEN, MD   3 years ago Routine general medical examination at a health care facility   [facility] [place], Theodoro FALCON, MD   4 years ago Essential hypertension   [facility] [place], Theodoro FALCON, MD   5 years ago Encounter for [employer] annual wellness exam   [facility] Leavy Mole, PA-C              Passed - Last BP in normal range    BP Readings from Last 1 Encounters:  04/25/23 126/78         Passed - Last Heart Rate in normal range    Pulse Readings from Last 1 Encounters:  04/25/23 88

## 2023-05-30 ENCOUNTER — Other Ambulatory Visit: Payer: Self-pay | Admitting: Family Medicine

## 2023-05-30 DIAGNOSIS — I739 Peripheral vascular disease, unspecified: Secondary | ICD-10-CM

## 2023-05-30 DIAGNOSIS — R0609 Other forms of dyspnea: Secondary | ICD-10-CM

## 2023-05-30 DIAGNOSIS — I6522 Occlusion and stenosis of left carotid artery: Secondary | ICD-10-CM

## 2023-06-05 ENCOUNTER — Ambulatory Visit: Payer: Medicare Other | Admitting: Nurse Practitioner

## 2023-06-05 ENCOUNTER — Telehealth: Payer: Self-pay

## 2023-06-05 ENCOUNTER — Encounter: Payer: Self-pay | Admitting: Nurse Practitioner

## 2023-06-05 VITALS — BP 120/80 | HR 47 | Ht 71.0 in | Wt 191.1 lb

## 2023-06-05 DIAGNOSIS — Z09 Encounter for follow-up examination after completed treatment for conditions other than malignant neoplasm: Secondary | ICD-10-CM

## 2023-06-05 DIAGNOSIS — Z7901 Long term (current) use of anticoagulants: Secondary | ICD-10-CM

## 2023-06-05 DIAGNOSIS — Z860101 Personal history of adenomatous and serrated colon polyps: Secondary | ICD-10-CM | POA: Diagnosis not present

## 2023-06-05 DIAGNOSIS — Z860100 Personal history of colon polyps, unspecified: Secondary | ICD-10-CM

## 2023-06-05 MED ORDER — SUFLAVE 178.7 G PO SOLR: 1.0000 | Freq: Once | ORAL | 0 refills | Status: AC

## 2023-06-05 NOTE — Telephone Encounter (Signed)
   Patient Name: Jesus Klein  DOB: 11-05-1950 MRN: [id number]  Primary Cardiologist: Nanetta Batty, MD  Chart reviewed as part of pre-operative protocol coverage.   He may hold Plavix for 5 days prior to planned procedure with recommendation to resume post-procedure when deemed clinically feasible by surgical team.    Napoleon Form, Leodis Rains, NP 06/05/2023, 11:09 AM

## 2023-06-05 NOTE — Progress Notes (Signed)
Brief Narrative 73 y.o. yo male m known to Dr. Marina Goodell m with a past medical history not limited to colon polyps, peripheral vascular disease on plavix, hypertension  ASSESSMENT    History of recurrent colon polyps including multiple tubular adenomas removed at time of last colonoscopy October 2021.  Due for 3-year polyp surveillance colonoscopy  Peripheral vascular disease involving the carotid, subclavian and lower extremities He is status post remote carotid stenting in 2016. On Plavix  See PMH for any additional medical history   PLAN    --Schedule for surveillance colonoscopy. The risks and benefits of colonoscopy with possible polypectomy / biopsies were discussed and the patient agrees to proceed.  --Hold Plavix for 5 days before procedure - will instruct when and how to resume after procedure. Patient understands that there is a low but real risk of cardiovascular event such as heart attack, stroke, or embolism /  thrombosis, or ischemia while off Plavix. The patient consents to proceed. Will communicate by phone or EMR with patient's prescribing provider to confirm that holding Plavix is reasonable in this case.    HPI   Chief complaint :  No complaints. Here to discuss colonoscopy  Jesus Klein is is due for a polyp surveillance colonoscopy. He isn't having any GI symptoms . Specifically , no constipation, diarrhea or other bowel changes. No blood in stool. No changes any in any previously reported [facility] of colon cancer. No general medical complaints such as chest pain or shortness of breath.   Recent hgb, renal function, LFTs normal.    GI History / Studies   **May not be a complete list of studies  Surveillance colonoscopy October 2021 -Eight millimeter polyps in the sigmoid colon, descending colon, transverse colon, and ascending colon, removed.  Diverticulosis in the sigmoid and right.  Exam otherwise normal  1. Surgical [P], colon, transverse x 2 and ascending x 1, polyp  (3) - TUBULAR ADENOMA, NEGATIVE FOR HIGH GRADE DYSPLASIA (X3). 2. Surgical [P], colon, descending x 2, sigmoid x 1, polyp (3) - TUBULAR ADENOMA, NEGATIVE FOR HIGH GRADE DYSPLASIA (X3). 3. Surgical [P], colon, sigmoid x 2, polyp (2) - TUBULAR ADENOMA, NEGATIVE FOR HIGH GRADE DYSPLASIA (  Labs      Latest Ref Rng & Units 04/25/2023    8:15 AM 05/15/2022   12:26 PM 10/06/2021   11:34 AM  CBC  WBC 3.8 - 10.8 Thousand/uL 6.0  6.4  5.4   Hemoglobin 13.2 - 17.1 g/dL 41.3  24.4  01.0   Hematocrit 38.5 - 50.0 % 52.3  49.7  47.7   Platelets 140 - 400 Thousand/uL 290  302  300     No results found for: "LIPASE"    Latest Ref Rng & Units 04/25/2023    8:15 AM 08/29/2022   10:18 AM 05/15/2022   12:26 PM  CMP  Glucose 65 - 99 mg/dL 87   94   BUN 7 - 25 mg/dL 22   16   Creatinine 2.72 - 1.28 mg/dL 5.36   6.44   Sodium 034 - 146 mmol/L 140   141   Potassium 3.5 - 5.3 mmol/L 4.3   5.0   Chloride 98 - 110 mmol/L 103   104   CO2 20 - 32 mmol/L 26   29   Calcium 8.6 - 10.3 mg/dL 9.8   74.2   Total Protein 6.1 - 8.1 g/dL 7.1  7.0  7.3   Total Bilirubin 0.2 - 1.2 mg/dL 0.5  0.5  0.6   Alkaline Phos 44 - 121 IU/L  71    AST 10 - 35 U/L 23  25  21    ALT 9 - 46 U/L 29  30  25     Echocardiogram Feb 2022  Grade I DD EF 55-50%    Past Medical History:  Diagnosis Date   Allergy    seasonal allergies   Arthritis    arms/shoulders   Blood transfusion without reported diagnosis 2009   with hip replacement   History of tobacco abuse    Hyperlipidemia    on meds   Hypertension 2010   on meds   Left-sided carotid artery disease (HCC)    high-grade ostial left common carotid artery stenosis   Peripheral arterial disease (HCC)    a. 02/2014 - right common iliac and external iliac intervention. b. H/o PTA/stenting of left common carotid artery stenosis. Known 80% left subclavian artery stenosis without UE claudication/steal sx.   Subclavian artery stenosis, left [facility])    Past Surgical History:   Procedure Laterality Date   CAROTID STENT INSERTION N/A 04/28/2014   Procedure: CAROTID STENT INSERTION;  Surgeon: Nada Libman, MD;  Location: [facility] CATH LAB;  Service: Cardiovascular;  Laterality: N/A;   ILIAC ARTERY STENT Right 03/04/2014   w/as well as right external iliac and common femoral artery stenting/notes 03/04/2014   JOINT REPLACEMENT     LOWER EXTREMITY ANGIOGRAM N/A 03/04/2014   Procedure: LOWER EXTREMITY ANGIOGRAM;  Surgeon: Runell Gess, MD;  Location: [facility] CATH LAB;  Service: Cardiovascular;  Laterality: N/A;   TOTAL HIP ARTHROPLASTY Right 10/2005   WISDOM TOOTH EXTRACTION     Family History  Problem Relation Age of Onset   Hyperlipidemia Mother    Colitis Mother    Diabetes Mother    Lung cancer Mother 45   Liver cancer Mother 12       mets from lung   Brain cancer Mother 55       mets from lung   Kidney cancer Mother 62       mets from kung   Diverticulitis Mother    Arthritis Father    COPD Father    Diabetes Father    Hypertension Father    Colon cancer Neg Hx    Stomach cancer Neg Hx    Colon polyps Neg Hx    Esophageal cancer Neg Hx    Rectal cancer Neg Hx    Social History   Tobacco Use   Smoking status: Former    Current packs/day: 0.00    Average packs/day: 1.5 packs/day for 30.0 years (45.0 ttl pk-yrs)    Types: Cigarettes    Start date: 01/20/1979    Quit date: 01/19/2009    Years since quitting: 14.3   Smokeless tobacco: Never  Vaping Use   Vaping status: Never Used  Substance Use Topics   Alcohol use: Yes    Alcohol/week: 2.0 standard drinks of alcohol    Types: 2 Cans of beer per week   Drug use: No   Current Outpatient Medications  Medication Sig Dispense Refill   amLODipine (NORVASC) 5 MG tablet Take 1 tablet (5 mg total) by mouth daily. 90 tablet 1   atorvastatin (LIPITOR) 80 MG tablet TAKE 1 TABLET BY MOUTH EVERY DAY 90 tablet 2   cetirizine (ZYRTEC) 10 MG tablet Take 10 mg by mouth daily as needed.      Cholecalciferol  (VITAMIN D) 125 MCG (5000 UT) CAPS Take 5,000  Units by mouth daily.      clopidogrel (PLAVIX) 75 MG tablet TAKE 1 TABLET BY MOUTH EVERY DAY 90 tablet 0   Coenzyme Q10 300 MG CAPS Take 300 mg by mouth daily.      ezetimibe (ZETIA) 10 MG tablet Take 1 tablet (10 mg total) by mouth daily. 90 tablet 0   fluticasone-salmeterol (ADVAIR DISKUS) 250-50 MCG/ACT AEPB Inhale 1 puff into the lungs in the morning and at bedtime. 60 each 11   Multiple Vitamin (MULTIVITAMIN WITH MINERALS) TABS tablet Take 1 tablet by mouth daily.     polyvinyl alcohol (LIQUIFILM TEARS) 1.4 % ophthalmic solution Place 1 drop into both eyes daily as needed for dry eyes (allergies).     No current facility-administered medications for this visit.   No Known Allergies   Review of Systems: All systems reviewed and negative except where noted in HPI.   Wt Readings from Last 3 Encounters:  04/25/23 187 lb (84.8 kg)  08/29/22 194 lb (88 kg)  05/15/22 191 lb 3.2 oz (86.7 kg)    Physical Exam:  BP 120/80 (BP Location: Left Arm, Patient Position: Sitting, Cuff Size: Normal)   Pulse (!) 47   Ht 5\' 11"  (1.803 m)   Wt 191 lb 2 oz (86.7 kg)   SpO2 96%   BMI 26.66 kg/m  Constitutional:  Pleasant, generally well appearing male in no acute distress. Psychiatric:  Normal mood and affect. Behavior is normal. EENT: Pupils normal.  Conjunctivae are normal. No scleral icterus. Neck supple.  Cardiovascular: Normal rate Pulmonary/chest: Effort normal and breath sounds normal. No wheezing, rales or rhonchi. Abdominal: Soft, nondistended, nontender. Bowel sounds active throughout. There are no masses palpable. . Neurological: Alert and oriented to person place and time.   Willette Cluster, NP  06/05/2023, 8:40 AM

## 2023-06-05 NOTE — Progress Notes (Signed)
Assessment and plan noted ?

## 2023-06-05 NOTE — Patient Instructions (Signed)
You have been scheduled for a colonoscopy. Please follow written instructions given to you at your visit today.   If you use inhalers (even only as needed), please bring them with you on the day of your procedure.  You will be contaced by our office prior to your procedure for directions on holding your Plavix.  If you do not hear from our office 1 week prior to your scheduled procedure, please call [phone] to discuss.   DO NOT TAKE 7 DAYS PRIOR TO TEST- Trulicity (dulaglutide) Ozempic, Wegovy (semaglutide) Mounjaro (tirzepatide) Bydureon Bcise (exanatide extended release)  DO NOT TAKE 1 DAY PRIOR TO YOUR TEST Rybelsus (semaglutide) Adlyxin (lixisenatide) Victoza (liraglutide) Byetta (exanatide) ___________________________________________________________________________  Bonita Quin will receive your bowel preparation through [facility], which ensures the lowest copay and home delivery, with outreach via text or call from an [phone] number. Please respond promptly to avoid rescheduling of your procedure. If you are interested in alternative options or have any questions regarding your prep, please contact them at [phone] ____________________________________________________________________________  Your Provider Has Sent Your Bowel Prep Regimen To [facility]   [facility] will contact you to verify your information and collect your copay, if applicable. Enjoy the comfort of your home while your prescription is mailed to you, FREE of any shipping charges.   [facility] accepts all major insurance benefits and applies discounts & coupons.  Have additional questions?   Chat: [email or web address] Call: [phone] Email: [email or web address] NCPDP: [id number]  How will [facility] contact you?  With a Welcome phone call,  a Welcome text and a checkout link in text form.  Texts you receive from [phone] Are NOT Spam.  *To set up delivery, you must complete the checkout  process via link or speak to one of the patient care representatives. If [facility] is unable to reach you, your prescription may be delayed.  To avoid long hold times on the phone, you may also utilize the secure chat feature on the [facility] website to request that they call you back for transaction completion or to expedite your concerns.  I appreciate the opportunity to care for you. Willette Cluster, NP

## 2023-06-05 NOTE — Telephone Encounter (Signed)
[facility] Pre-operative Risk Assessment     Request for surgical clearance:     Endoscopy Procedure  What type of surgery is being performed?     colonoscopy  When is this surgery scheduled?     07/15/2023  What type of clearance is required ?   Pharmacy  Are there any medications that need to be held prior to surgery and how long? Plavix, 5 days  Practice name and name of physician performing surgery?      [facility]  What is your office phone and fax number?      Phone- [phone]  Fax- [phone]  Anesthesia type (None, local, MAC, general) ?       MAC   Please route your response to Tranquilino Fischler [place], CMA

## 2023-06-06 NOTE — Telephone Encounter (Signed)
Jesus Klein informed to hold Plavix 5 days prior and verbalized understanding.

## 2023-06-27 ENCOUNTER — Ambulatory Visit: Payer: Medicare Other

## 2023-06-27 DIAGNOSIS — Z23 Encounter for immunization: Secondary | ICD-10-CM

## 2023-06-27 NOTE — Progress Notes (Signed)
 Patient is in office today for a nurse visit for Immunization. Patient Injection was given in the  Left deltoid. Patient tolerated injection well. 2nd shingles vaccine.

## 2023-07-15 ENCOUNTER — Encounter: Payer: Medicare Other | Admitting: Internal Medicine

## 2023-07-16 ENCOUNTER — Ambulatory Visit: Payer: Medicare Other | Admitting: Internal Medicine

## 2023-07-16 ENCOUNTER — Encounter: Payer: Self-pay | Admitting: Internal Medicine

## 2023-07-16 VITALS — BP 104/77 | HR 78 | Temp 98.6°F | Resp 19 | Ht 71.0 in | Wt 191.0 lb

## 2023-07-16 DIAGNOSIS — D124 Benign neoplasm of descending colon: Secondary | ICD-10-CM

## 2023-07-16 DIAGNOSIS — Z1211 Encounter for screening for malignant neoplasm of colon: Secondary | ICD-10-CM

## 2023-07-16 DIAGNOSIS — D128 Benign neoplasm of rectum: Secondary | ICD-10-CM

## 2023-07-16 DIAGNOSIS — K621 Rectal polyp: Secondary | ICD-10-CM | POA: Diagnosis not present

## 2023-07-16 DIAGNOSIS — K5730 Diverticulosis of large intestine without perforation or abscess without bleeding: Secondary | ICD-10-CM

## 2023-07-16 DIAGNOSIS — Z860101 Personal history of adenomatous and serrated colon polyps: Secondary | ICD-10-CM | POA: Diagnosis not present

## 2023-07-16 DIAGNOSIS — I10 Essential (primary) hypertension: Secondary | ICD-10-CM | POA: Diagnosis not present

## 2023-07-16 DIAGNOSIS — D125 Benign neoplasm of sigmoid colon: Secondary | ICD-10-CM

## 2023-07-16 DIAGNOSIS — K635 Polyp of colon: Secondary | ICD-10-CM

## 2023-07-16 DIAGNOSIS — Z860100 Personal history of colon polyps, unspecified: Secondary | ICD-10-CM

## 2023-07-16 MED ORDER — SODIUM CHLORIDE 0.9 % IV SOLN: 500.0000 mL | INTRAVENOUS | Status: DC

## 2023-07-16 NOTE — Progress Notes (Signed)
 [place], gd SR, tolerated procedure well, VSS, report to RN

## 2023-07-16 NOTE — Op Note (Signed)
 [facility] Patient Name: Jesus Klein Procedure Date: 07/16/2023 2:13 PM MRN: [id number] Endoscopist: Wilhemina Bonito. Marina Goodell , MD, [id number] Age: 73 Referring MD:  Date of Birth: 1950/09/24 Gender: Male Account #: [id number] Procedure:                Colonoscopy with cold snare polypectomy x 3; biopsy                            polypectomy x 1 Indications:              High risk colon cancer surveillance: Personal                            history of multiple (3 or more) adenomas. Previous                            examinations 2018, 2021 Medicines:                Monitored Anesthesia Care Procedure:                Pre-Anesthesia Assessment:                           - Prior to the procedure, a History and Physical                            was performed, and patient medications and                            allergies were reviewed. The patient's tolerance of                            previous anesthesia was also reviewed. The risks                            and benefits of the procedure and the sedation                            options and risks were discussed with the patient.                            All questions were answered, and informed consent                            was obtained. Prior Anticoagulants: The patient has                            taken Plavix (clopidogrel), last dose was 6 days                            prior to procedure. ASA Grade Assessment: III - A                            patient with severe systemic disease. After  reviewing the risks and benefits, the patient was                            deemed in satisfactory condition to undergo the                            procedure.                           After obtaining informed consent, the colonoscope                            was passed under direct vision. Throughout the                            procedure, the patient's blood pressure, pulse, and                             oxygen saturations were monitored continuously. The                            Olympus Scope SN: [id number] was introduced through                            the anus and advanced to the the cecum, identified                            by appendiceal orifice and ileocecal valve. The                            ileocecal valve, appendiceal orifice, and rectum                            were photographed. The quality of the bowel                            preparation was excellent. The colonoscopy was                            performed without difficulty. The patient tolerated                            the procedure well. The bowel preparation used was                            SUPREP via split dose instruction. Scope In: 2:32:01 PM Scope Out: 2:54:38 PM Scope Withdrawal Time: 0 hours 17 minutes 57 seconds  Total Procedure Duration: 0 hours 22 minutes 37 seconds  Findings:                 Three polyps were found in the sigmoid colon and                            descending colon. The polyps were 3 to 4 mm in  size. These polyps were removed with a cold snare.                            Resection and retrieval were complete.                           A 1 mm polyp was found in the rectum. The polyp was                            removed with a jumbo cold forceps. Resection and                            retrieval were complete.                           Multiple diverticula were found in the left colon                            and right colon.                           The exam was otherwise without abnormality on                            direct and retroflexion views. Complications:            No immediate complications. Estimated blood loss:                            None. Estimated Blood Loss:     Estimated blood loss: none. Impression:               - Three 3 to 4 mm polyps in the sigmoid colon and                            in the  descending colon, removed with a cold snare.                            Resected and retrieved.                           - One 1 mm polyp in the rectum, removed with a                            jumbo cold forceps. Resected and retrieved.                           - Diverticulosis in the left colon and in the right                            colon.                           - The examination was otherwise normal on direct  and retroflexion views. Recommendation:           - Repeat colonoscopy in 5 years for surveillance.                           - Patient has a contact number available for                            emergencies. The signs and symptoms of potential                            delayed complications were discussed with the                            patient. Return to normal activities tomorrow.                            Written discharge instructions were provided to the                            patient.                           - Resume previous diet.                           - Continue present medications.                           -Resume Plavix today                           - Await pathology results. Wilhemina Bonito. Marina Goodell, MD 07/16/2023 3:01:04 PM This report has been signed electronically.

## 2023-07-16 NOTE — Progress Notes (Signed)
 Expand All Collapse All        Brief Narrative 73 y.o. yo male m known to Dr. Marina Goodell m with a past medical history not limited to colon polyps, peripheral vascular disease on plavix, hypertension   ASSESSMENT     History of recurrent colon polyps including multiple tubular adenomas removed at time of last colonoscopy October 2021.  Due for 3-year polyp surveillance colonoscopy   Peripheral vascular disease involving the carotid, subclavian and lower extremities He is status post remote carotid stenting in 2016. On Plavix   See PMH for any additional medical history     PLAN    --Schedule for surveillance colonoscopy. The risks and benefits of colonoscopy with possible polypectomy / biopsies were discussed and the patient agrees to proceed.  --Hold Plavix for 5 days before procedure - will instruct when and how to resume after procedure. Patient understands that there is a low but real risk of cardiovascular event such as heart attack, stroke, or embolism /  thrombosis, or ischemia while off Plavix. The patient consents to proceed. Will communicate by phone or EMR with patient's prescribing provider to confirm that holding Plavix is reasonable in this case.      HPI    Chief complaint :  No complaints. Here to discuss colonoscopy   Jesus Klein is is due for a polyp surveillance colonoscopy. He isn't having any GI symptoms . Specifically , no constipation, diarrhea or other bowel changes. No blood in stool. No changes any in any previously reported [employer] of colon cancer. No general medical complaints such as chest pain or shortness of breath.    Recent hgb, renal function, LFTs normal.      GI History / Studies    **May not be a complete list of studies   Surveillance colonoscopy October 2021 -Eight millimeter polyps in the sigmoid colon, descending colon, transverse colon, and ascending colon, removed.  Diverticulosis in the sigmoid and right.  Exam otherwise normal   1. Surgical [P],  colon, transverse x 2 and ascending x 1, polyp (3) - TUBULAR ADENOMA, NEGATIVE FOR HIGH GRADE DYSPLASIA (X3). 2. Surgical [P], colon, descending x 2, sigmoid x 1, polyp (3) - TUBULAR ADENOMA, NEGATIVE FOR HIGH GRADE DYSPLASIA (X3). 3. Surgical [P], colon, sigmoid x 2, polyp (2) - TUBULAR ADENOMA, NEGATIVE FOR HIGH GRADE DYSPLASIA (   Labs        Latest Ref Rng & Units 04/25/2023    8:15 AM 05/15/2022   12:26 PM 10/06/2021   11:34 AM  CBC  WBC 3.8 - 10.8 Thousand/uL 6.0  6.4  5.4   Hemoglobin 13.2 - 17.1 g/dL 81.1  91.4  78.2   Hematocrit 38.5 - 50.0 % 52.3  49.7  47.7   Platelets 140 - 400 Thousand/uL 290  302  300       Recent Labs  No results found for: "LIPASE"       Latest Ref Rng & Units 04/25/2023    8:15 AM 08/29/2022   10:18 AM 05/15/2022   12:26 PM  CMP  Glucose 65 - 99 mg/dL 87    94   BUN 7 - 25 mg/dL 22    16   Creatinine 9.56 - 1.28 mg/dL 2.13    0.86   Sodium 578 - 146 mmol/L 140    141   Potassium 3.5 - 5.3 mmol/L 4.3    5.0   Chloride 98 - 110 mmol/L 103    104   CO2 20 -  32 mmol/L 26    29   Calcium 8.6 - 10.3 mg/dL 9.8    62.1   Total Protein 6.1 - 8.1 g/dL 7.1  7.0  7.3   Total Bilirubin 0.2 - 1.2 mg/dL 0.5  0.5  0.6   Alkaline Phos 44 - 121 IU/L   71     AST 10 - 35 U/L 23  25  21    ALT 9 - 46 U/L 29  30  25      Echocardiogram Feb 2022   Grade I DD EF 55-50%         Past Medical History:  Diagnosis Date   Allergy      seasonal allergies   Arthritis      arms/shoulders   Blood transfusion without reported diagnosis 2009    with hip replacement   History of tobacco abuse     Hyperlipidemia      on meds   Hypertension 2010    on meds   Left-sided carotid artery disease (HCC)      high-grade ostial left common carotid artery stenosis   Peripheral arterial disease (HCC)      a. 02/2014 - right common iliac and external iliac intervention. b. H/o PTA/stenting of left common carotid artery stenosis. Known 80% left subclavian artery stenosis  without UE claudication/steal sx.   Subclavian artery stenosis, left [facility])               Past Surgical History:  Procedure Laterality Date   CAROTID STENT INSERTION N/A 04/28/2014    Procedure: CAROTID STENT INSERTION;  Surgeon: Nada Libman, MD;  Location: [facility] C[facility] LAB;  Service: Cardiovascular;  Laterality: N/A;   ILIAC ARTERY STENT Right 03/04/2014    w/as well as right external iliac and common femoral artery stenting/notes 03/04/2014   JOINT REPLACEMENT       LOWER EXTREMITY ANGIOGRAM N/A 03/04/2014    Procedure: LOWER EXTREMITY ANGIOGRAM;  Surgeon: Runell Gess, MD;  Location: [facility] CATH LAB;  Service: Cardiovascular;  Laterality: N/A;   TOTAL HIP ARTHROPLASTY Right 10/2005   WISDOM TOOTH EXTRACTION                 Family History  Problem Relation Age of Onset   Hyperlipidemia Mother     Colitis Mother     Diabetes Mother     Lung cancer Mother 56   Liver cancer Mother 72        mets from lung   Brain cancer Mother 53        mets from lung   Kidney cancer Mother 35        mets from kung   Diverticulitis Mother     Arthritis Father     COPD Father     Diabetes Father     Hypertension Father     Colon cancer Neg Hx     Stomach cancer Neg Hx     Colon polyps Neg Hx     Esophageal cancer Neg Hx     Rectal cancer Neg Hx          Social History  Social History         Tobacco Use   Smoking status: Former      Current packs/day: 0.00      Average packs/day: 1.5 packs/day for 30.0 years (45.0 ttl pk-yrs)      Types: Cigarettes      Start date: 01/20/1979      Quit date:  01/19/2009      Years since quitting: 14.3   Smokeless tobacco: Never  Vaping Use   Vaping status: Never Used  Substance Use Topics   Alcohol use: Yes      Alcohol/week: 2.0 standard drinks of alcohol      Types: 2 Cans of beer per week   Drug use: No            Current Outpatient Medications  Medication Sig Dispense Refill   amLODipine (NORVASC) 5 MG tablet Take 1 tablet (5 mg total)  by mouth daily. 90 tablet 1   atorvastatin (LIPITOR) 80 MG tablet TAKE 1 TABLET BY MOUTH EVERY DAY 90 tablet 2   cetirizine (ZYRTEC) 10 MG tablet Take 10 mg by mouth daily as needed.        Cholecalciferol (VITAMIN D) 125 MCG (5000 UT) CAPS Take 5,000 Units by mouth daily.        clopidogrel (PLAVIX) 75 MG tablet TAKE 1 TABLET BY MOUTH EVERY DAY 90 tablet 0   Coenzyme Q10 300 MG CAPS Take 300 mg by mouth daily.        ezetimibe (ZETIA) 10 MG tablet Take 1 tablet (10 mg total) by mouth daily. 90 tablet 0   fluticasone-salmeterol (ADVAIR DISKUS) 250-50 MCG/ACT AEPB Inhale 1 puff into the lungs in the morning and at bedtime. 60 each 11   Multiple Vitamin (MULTIVITAMIN WITH MINERALS) TABS tablet Take 1 tablet by mouth daily.       polyvinyl alcohol (LIQUIFILM TEARS) 1.4 % ophthalmic solution Place 1 drop into both eyes daily as needed for dry eyes (allergies).          No current facility-administered medications for this visit.      Allergies  No Known Allergies       Review of Systems: All systems reviewed and negative except where noted in HPI.       Wt Readings from Last 3 Encounters:  04/25/23 187 lb (84.8 kg)  08/29/22 194 lb (88 kg)  05/15/22 191 lb 3.2 oz (86.7 kg)      Physical Exam:  BP 120/80 (BP Location: Left Arm, Patient Position: Sitting, Cuff Size: Normal)   Pulse (!) 47   Ht 5\' 11"  (1.803 m)   Wt 191 lb 2 oz (86.7 kg)   SpO2 96%   BMI 26.66 kg/m  Constitutional:  Pleasant, generally well appearing male in no acute distress. Psychiatric:  Normal mood and affect. Behavior is normal. EENT: Pupils normal.  Conjunctivae are normal. No scleral icterus. Neck supple.  Cardiovascular: Normal rate Pulmonary/chest: Effort normal and breath sounds normal. No wheezing, rales or rhonchi. Abdominal: Soft, nondistended, nontender. Bowel sounds active throughout. There are no masses palpable. . Neurological: Alert and oriented to person place and time.     Willette Cluster,  NP  06/05/2023, 8:40 AM

## 2023-07-16 NOTE — Patient Instructions (Signed)
 Please read handouts provided. Continue present medications. Await pathology results. Resume Plavix today. Repeat colonoscopy in 5 years for screening. Resume previous diet.  YOU HAD AN ENDOSCOPIC PROCEDURE TODAY AT THE [facility]:   Refer to the procedure report that was given to you for any specific questions about what was found during the examination.  If the procedure report does not answer your questions, please call your gastroenterologist to clarify.  If you requested that your care partner not be given the details of your procedure findings, then the procedure report has been included in a sealed envelope for you to review at your convenience later.  YOU SHOULD EXPECT: Some feelings of bloating in the abdomen. Passage of more gas than usual.  Walking can help get rid of the air that was put into your GI tract during the procedure and reduce the bloating. If you had a lower endoscopy (such as a colonoscopy or flexible sigmoidoscopy) you may notice spotting of blood in your stool or on the toilet paper. If you underwent a bowel prep for your procedure, you may not have a normal bowel movement for a few days.  Please Note:  You might notice some irritation and congestion in your nose or some drainage.  This is from the oxygen used during your procedure.  There is no need for concern and it should clear up in a day or so.  SYMPTOMS TO REPORT IMMEDIATELY:  Following lower endoscopy (colonoscopy or flexible sigmoidoscopy):  Excessive amounts of blood in the stool  Significant tenderness or worsening of abdominal pains  Swelling of the abdomen that is new, acute  Fever of 100F or higher.  For urgent or emergent issues, a gastroenterologist can be reached at any hour by calling [phone]. Do not use MyChart messaging for urgent concerns.    DIET:  We do recommend a small meal at first, but then you may proceed to your regular diet.  Drink plenty of fluids but you should  avoid alcoholic beverages for 24 hours.  ACTIVITY:  You should plan to take it easy for the rest of today and you should NOT DRIVE or use heavy machinery until tomorrow (because of the sedation medicines used during the test).    FOLLOW UP: Our staff will call the number listed on your records the next business day following your procedure.  We will call around 7:15- 8:00 am to check on you and address any questions or concerns that you may have regarding the information given to you following your procedure. If we do not reach you, we will leave a message.     If any biopsies were taken you will be contacted by phone or by letter within the next 1-3 weeks.  Please call us at [phone] if you have not heard about the biopsies in 3 weeks.    SIGNATURES/CONFIDENTIALITY: You and/or your care partner have signed paperwork which will be entered into your electronic medical record.  These signatures attest to the fact that that the information above on your After Visit Summary has been reviewed and is understood.  Full responsibility of the confidentiality of this discharge information lies with you and/or your care-partner.

## 2023-07-17 ENCOUNTER — Telehealth: Payer: Self-pay | Admitting: *Deleted

## 2023-07-17 NOTE — Telephone Encounter (Signed)
  Follow up Call-     07/16/2023    1:04 PM  Call back number  Post procedure Call Back phone  # [phone]  Permission to leave phone message Yes     Patient questions:  Do you have a fever, pain , or abdominal swelling? No. Pain Score  0 *  Have you tolerated food without any problems? Yes.    Have you been able to return to your normal activities? Yes.    Do you have any questions about your discharge instructions: Diet   No. Medications  No. Follow up visit  No.  Do you have questions or concerns about your Care? No.  Actions: * If pain score is 4 or above: No action needed, pain <4.

## 2023-07-18 ENCOUNTER — Other Ambulatory Visit: Payer: Self-pay | Admitting: Family Medicine

## 2023-07-19 LAB — SURGICAL PATHOLOGY

## 2023-07-19 NOTE — Telephone Encounter (Signed)
 Requested Prescriptions  Pending Prescriptions Disp Refills   ezetimibe (ZETIA) 10 MG tablet [Pharmacy Med Name: EZETIMIBE 10 MG TABLET] 90 tablet 2    Sig: TAKE 1 TABLET BY MOUTH EVERY DAY     Cardiovascular:  Antilipid - Sterol Transport Inhibitors Failed - 07/19/2023 10:52 AM      Failed - Valid encounter within last 12 months    Recent Outpatient Visits           2 months ago Essential hypertension   [place] [facility] Pickard, Priscille Heidelberg, MD   1 year ago Dyspnea on exertion   [facility] [facility] Donita Brooks, MD   1 year ago Presbycusis of both ears   [place] [facility] Pickard, Priscille Heidelberg, MD   1 year ago PAD (peripheral artery disease) [facility])   [place] [facility] Donita Brooks, MD              Failed - Lipid Panel in normal range within the last 12 months    Cholesterol, Total  Date Value Ref Range Status  08/29/2022 150 100 - 199 mg/dL Final   Cholesterol  Date Value Ref Range Status  04/25/2023 122 <200 mg/dL Final   LDL Cholesterol (Calc)  Date Value Ref Range Status  04/25/2023 59 mg/dL (calc) Final    Comment:    Reference range: <100 . Desirable range <100 mg/dL for primary prevention;   <70 mg/dL for patients with CHD or diabetic patients  with > or = 2 CHD risk factors. Marland Kitchen LDL-C is now calculated using the Martin-Hopkins  calculation, which is a validated novel method providing  better accuracy than the Friedewald equation in the  estimation of LDL-C.  Horald Pollen et al. Lenox Ahr. 3086;578(46): 2061-2068  (http://education.[employer].com/faq/FAQ164)    HDL  Date Value Ref Range Status  04/25/2023 44 > OR = 40 mg/dL Final  96/29/5284 53 >13 mg/dL Final   Triglycerides  Date Value Ref Range Status  04/25/2023 103 <150 mg/dL Final         Passed - AST in normal range and within 360 days    AST  Date Value Ref Range Status  04/25/2023 23 10 - 35 U/L  Final         Passed - ALT in normal range and within 360 days    ALT  Date Value Ref Range Status  04/25/2023 29 9 - 46 U/L Final         Passed - Patient is not pregnant

## 2023-07-20 ENCOUNTER — Encounter: Payer: Self-pay | Admitting: Internal Medicine

## 2023-07-23 ENCOUNTER — Ambulatory Visit (HOSPITAL_BASED_OUTPATIENT_CLINIC_OR_DEPARTMENT_OTHER)
Admission: RE | Admit: 2023-07-23 | Discharge: 2023-07-23 | Disposition: A | Discharge status: Home / Self Care | Source: Ambulatory Visit | Attending: Cardiology | Admitting: Cardiology

## 2023-07-23 ENCOUNTER — Ambulatory Visit (HOSPITAL_COMMUNITY)
Admission: RE | Admit: 2023-07-23 | Discharge: 2023-07-23 | Disposition: A | Discharge status: Home / Self Care | Source: Ambulatory Visit | Attending: Cardiology | Admitting: Cardiology

## 2023-07-23 ENCOUNTER — Other Ambulatory Visit (HOSPITAL_COMMUNITY): Payer: Self-pay | Admitting: Cardiovascular Disease

## 2023-07-23 DIAGNOSIS — I739 Peripheral vascular disease, unspecified: Secondary | ICD-10-CM

## 2023-07-23 DIAGNOSIS — Z95828 Presence of other vascular implants and grafts: Secondary | ICD-10-CM | POA: Diagnosis not present

## 2023-07-23 DIAGNOSIS — I6523 Occlusion and stenosis of bilateral carotid arteries: Secondary | ICD-10-CM | POA: Diagnosis not present

## 2023-07-23 DIAGNOSIS — E782 Mixed hyperlipidemia: Secondary | ICD-10-CM | POA: Diagnosis not present

## 2023-07-23 LAB — VAS US ABI WITH/WO TBI
Left ABI: 0.62
Right ABI: 0.88

## 2023-07-31 ENCOUNTER — Ambulatory Visit (INDEPENDENT_AMBULATORY_CARE_PROVIDER_SITE_OTHER)

## 2023-07-31 ENCOUNTER — Ambulatory Visit: Attending: Cardiovascular Disease | Admitting: Cardiovascular Disease

## 2023-07-31 VITALS — BP 176/58 | HR 100 | Ht 71.0 in | Wt 190.0 lb

## 2023-07-31 DIAGNOSIS — I739 Peripheral vascular disease, unspecified: Secondary | ICD-10-CM

## 2023-07-31 DIAGNOSIS — Z87891 Personal history of nicotine dependence: Secondary | ICD-10-CM

## 2023-07-31 DIAGNOSIS — I493 Ventricular premature depolarization: Secondary | ICD-10-CM

## 2023-07-31 DIAGNOSIS — E782 Mixed hyperlipidemia: Secondary | ICD-10-CM

## 2023-07-31 DIAGNOSIS — Z95828 Presence of other vascular implants and grafts: Secondary | ICD-10-CM

## 2023-07-31 DIAGNOSIS — I6522 Occlusion and stenosis of left carotid artery: Secondary | ICD-10-CM | POA: Diagnosis not present

## 2023-07-31 DIAGNOSIS — I10 Essential (primary) hypertension: Secondary | ICD-10-CM

## 2023-07-31 NOTE — Assessment & Plan Note (Signed)
 History of PAD status post angiography by myself 03/04/2014 revealing an occluded left common iliac artery with high-grade right common iliac artery and external iliac artery stenosis.  I performed PTA and stenting of his right common and external iliac artery which resulted in marked improvement in his claudication.  He had three-vessel runoff bilaterally.  His most recent Doppler studies however performed 07/23/23 revealed a right ABI of 0.88 and the left of 0.62.  He does have a known occluded left common iliac artery and now has high-grade right common and external iliac artery stenosis although he denies claudication.  Will continue to follow him noninvasively now on a semiannual basis.

## 2023-07-31 NOTE — Assessment & Plan Note (Signed)
 History of hyperlipidemia on statin therapy with lipid profile performed 04/25/2023 revealing total cholesterol 122, LDL 59 and HDL of 44.

## 2023-07-31 NOTE — Assessment & Plan Note (Signed)
 History of ostial left common carotid and left subclavian artery stenosis status post ostial left common carotid artery stenting by myself 04/28/2014.  Carotid Dopplers performed 07/23/23 revealed no evidence of ICA stenosis.  Does have bilateral carotid bruits.

## 2023-07-31 NOTE — Progress Notes (Signed)
 07/31/2023 NASON CONRADT   13-Apr-1951  [id number]  Primary Physician Tanya Nones Priscille Heidelberg, MD Primary Cardiologist: Runell Gess MD Nicholes Calamity, [place]  HPI:  Jesus Klein is a 73 y.o.  thin-appearing Caucasian male father of 2 children, grandfather and 2 grandchildren referred by Frazier Richards PA-C for peripheral vascular evaluation.I last saw him in the office 06/07/2021. I initially saw him in the office 08/29/2022.  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  He had Dopplers performed in the office today that showed high-grade ostial carotid, ostial/subclavian artery stenosis as well as an occluded left common iliac and high-grade right iliac stenosis. His cardiovascular risk factor profile is notable for a 30-pack-year history of tobacco abuse having stopped smoking 2 years ago as well as 2 hypertension. There is no family history of heart disease. He does have mild hyperlipidemia. He has never had a heart attack or stroke. Denies chest pain or shortness of breath. He did have a right total hip replacement performed by Dr. Lajoyce Corners in 2007. He noticed lifestyle limiting claudication 6 months ago right greater than left. On 03/04/14 I performed aortic arch angiography, selective left carotid angiography, abdominal aortography as well as right common iliac and external iliac intervention. His right lower extremity claudication has completely resolved. His ABI has normalized. He does not notice claudication as much in his left leg. Because of his high grade near ostial left common carotid artery stenosis heunderwent PTCA and stenting on 04/28/14 with a 7 mm x 19 mm long Omnilink balloon expandable stent with excellent result. Follow-up lower extremity Dopplers and carotid Dopplers performed 05/23/17 suggested in-stent restenosis within the iliac stents are widely patent carotid stent.  His most recent Dopplers  performed in January 2020 revealed patent carotid stent, and moderate in-stent restenosis within the right iliac stent although he denies claudication.   Since I saw him in the office a year ago he continues to do well.  He denies claudication, chest pain or shortness of breath.  His most recent carotid Dopplers suggest patency of his left carotid stent.  Does have a high-frequency signal in his left subclavian artery with symmetric upper extremity blood pressures.  His aortoiliac Dopplers performed 07/23/23 show occlusion of his left common iliac artery which was known from angiography in 2015 with progression of disease in his right common and external iliac artery.   Current Meds  Medication Sig   amLODipine (NORVASC) 5 MG tablet Take 1 tablet (5 mg total) by mouth daily.   atorvastatin (LIPITOR) 80 MG tablet TAKE 1 TABLET BY MOUTH EVERY DAY   cetirizine (ZYRTEC) 10 MG tablet Take 10 mg by mouth daily as needed.    Cholecalciferol (VITAMIN D) 125 MCG (5000 UT) CAPS Take 5,000 Units by mouth daily.    clopidogrel (PLAVIX) 75 MG tablet TAKE 1 TABLET BY MOUTH EVERY DAY   Coenzyme Q10 300 MG CAPS Take 300 mg by mouth daily.    ezetimibe (ZETIA) 10 MG tablet TAKE 1 TABLET BY MOUTH EVERY DAY   fluticasone-salmeterol (ADVAIR DISKUS) 250-50 MCG/ACT AEPB Inhale 1 puff into the lungs in the morning and at bedtime.   Multiple Vitamin (MULTIVITAMIN WITH MINERALS) TABS tablet Take 1 tablet by mouth daily.   polyvinyl alcohol (LIQUIFILM TEARS) 1.4 % ophthalmic solution Place 1 drop into both eyes daily as needed for dry eyes (allergies).  No Known Allergies  Social History   Socioeconomic History   Marital status: Married    Spouse name: Clydie Braun   Number of children: 2   Years of education: Not on file   Highest education level: Bachelor's degree (e.g., BA, AB, BS)  Occupational History   Not on file  Tobacco Use   Smoking status: Former    Current packs/day: 0.00    Average packs/day: 1.5  packs/day for 30.0 years (45.0 ttl pk-yrs)    Types: Cigarettes    Start date: 01/20/1979    Quit date: 01/19/2009    Years since quitting: 14.5   Smokeless tobacco: Never  Vaping Use   Vaping status: Never Used  Substance and Sexual Activity   Alcohol use: Yes    Alcohol/week: 2.0 standard drinks of alcohol    Types: 2 Cans of beer per week   Drug use: No   Sexual activity: Yes    Birth control/protection: None  Other Topics Concern   Not on file  Social History Narrative   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 He may occasionally lift 20 pounds but most is done by machine.    Mix of standing, sitting, moving around.      Used to walk a lot and do a lot outside. Got a treadmill to use during cold weather.   Married.   2 daughters- grown.         Social Drivers of Corporate investment banker Strain: Low Risk  (04/18/2023)   Overall Financial Resource Strain (CARDIA)    Difficulty of Paying Living Expenses: Not hard at all  Food Insecurity: No Food Insecurity (04/18/2023)   Hunger Vital Sign    Worried About Running Out of Food in the Last Year: Never true    Ran Out of Food in the Last Year: Never true  Transportation Needs: No Transportation Needs (04/18/2023)   PRAPARE - Administrator, Civil Service (Medical): No    Lack of Transportation (Non-Medical): No  Physical Activity: Sufficiently Active (04/18/2023)   Exercise Vital Sign    Days of Exercise per Week: 6 days    Minutes of Exercise per Session: 30 min  Stress: No Stress Concern Present (04/18/2023)   [employer] of Occupational Health - Occupational Stress Questionnaire    Feeling of Stress : Only a little  Social Connections: Moderately Isolated (04/18/2023)   Social Connection and Isolation Panel [NHANES]    Frequency of Communication with Friends and Family: Once a week    Frequency of Social Gatherings with Friends and Family: Once  a week    Attends Religious Services: More than 4 times per year    Active Member of [employer] or Organizations: No    Attends Banker Meetings: Not on file    Marital Status: Married  Catering manager Violence: Not At Risk (02/23/2021)   Humiliation, Afraid, Rape, and Kick questionnaire    Fear of Current or Ex-Partner: No    Emotionally Abused: No    Physically Abused: No    Sexually Abused: No     Review of Systems: General: negative for chills, fever, night sweats or weight changes.  Cardiovascular: negative for chest pain, dyspnea on exertion, edema, orthopnea, palpitations, paroxysmal nocturnal dyspnea or shortness of breath Dermatological: negative for rash Respiratory: negative for cough or wheezing Urologic: negative for hematuria Abdominal: negative for nausea, vomiting, diarrhea, bright red blood per  rectum, melena, or hematemesis Neurologic: negative for visual changes, syncope, or dizziness All other systems reviewed and are otherwise negative except as noted above.    Blood pressure (!) 176/58, pulse 100, height 5\' 11"  (1.803 m), weight 190 lb (86.2 kg), SpO2 94%.  General appearance: alert and no distress Neck: no adenopathy, no JVD, supple, symmetrical, trachea midline, thyroid not enlarged, symmetric, no tenderness/mass/nodules, and bilateral carotid bruits Lungs: clear to auscultation bilaterally Heart: regular rate and rhythm, S1, S2 normal, no murmur, click, rub or gallop Extremities: extremities normal, atraumatic, no cyanosis or edema Pulses: Diminished pedal pulses Skin: Skin color, texture, turgor normal. No rashes or lesions Neurologic: Grossly normal  EKG EKG Interpretation Date/Time:  Wednesday July 31 2023 14:50:15 EDT Ventricular Rate:  100 PR Interval:  146 QRS Duration:  88 QT Interval:  350 QTC Calculation: 451 R Axis:   33  Text Interpretation: Undetermined rhythm When compared with ECG of 29-Jan-2022 13:37, Current undetermined  rhythm precludes rhythm comparison, needs review Questionable change in QRS axis Confirmed by Nanetta Batty [phone]) on 07/31/2023 2:55:02 PM    ASSESSMENT AND PLAN:   Essential hypertension History of essential hypertension with blood pressure measured today at 176/58.  He is on amlodipine 5 mg a day.  He checks his blood pressure at home twice a week and usually it is much better than this.  Hyperlipidemia History of hyperlipidemia on statin therapy with lipid profile performed 04/25/2023 revealing total cholesterol 122, LDL 59 and HDL of 44.  Claudication of lower extremity (HCC) History of PAD status post angiography by myself 03/04/2014 revealing an occluded left common iliac artery with high-grade right common iliac artery and external iliac artery stenosis.  I performed PTA and stenting of his right common and external iliac artery which resulted in marked improvement in his claudication.  He had three-vessel runoff bilaterally.  His most recent Doppler studies however performed 07/23/23 revealed a right ABI of 0.88 and the left of 0.62.  He does have a known occluded left common iliac artery and now has high-grade right common and external iliac artery stenosis although he denies claudication.  Will continue to follow him noninvasively now on a semiannual basis.  Left-sided carotid artery disease s/p LCCA PTA/Stent 04/28/14 History of ostial left common carotid and left subclavian artery stenosis status post ostial left common carotid artery stenting by myself 04/28/2014.  Carotid Dopplers performed 07/23/23 revealed no evidence of ICA stenosis.  Does have bilateral carotid bruits.  History of tobacco abuse Remote history of tobacco abuse having quit 11 years ago.  PVC's (premature ventricular contractions) But no PVCs on his EKG today although he is minimally symptomatic from this.  I am going to perform a 1 week Zio patch and obtain a coronary calcium score to her stratify.     Runell Gess MD FACP,FACC,FAHA, [facility] 07/31/2023 3:13 PM

## 2023-07-31 NOTE — Patient Instructions (Addendum)
 Medication Instructions:  NO CHANGES *If you need a refill on your cardiac medications before your next appointment, please call your pharmacy*   Testing/Procedures: Dr. Allyson Sabal has ordered a CT coronary calcium score.   Test locations:  [facility]  [facility] at [facility]  This is $99 out of pocket.   Coronary CalciumScan A coronary calcium scan is an imaging test used to look for deposits of calcium and other fatty materials (plaques) in the inner lining of the blood vessels of the heart (coronary arteries). These deposits of calcium and plaques can partly clog and narrow the coronary arteries without producing any symptoms or warning signs. This puts a person at risk for a heart attack. This test can detect these deposits before symptoms develop. Tell a health care provider about: Any allergies you have. All medicines you are taking, including vitamins, herbs, eye drops, creams, and over-the-counter medicines. Any problems you or family members have had with anesthetic medicines. Any blood disorders you have. Any surgeries you have had. Any medical conditions you have. Whether you are pregnant or may be pregnant. What are the risks? Generally, this is a safe procedure. However, problems may occur, including: Harm to a pregnant woman and her unborn baby. This test involves the use of radiation. Radiation exposure can be dangerous to a pregnant woman and her unborn baby. If you are pregnant, you generally should not have this procedure done. Slight increase in the risk of cancer. This is because of the radiation involved in the test. What happens before the procedure? No preparation is needed for this procedure. What happens during the procedure? You will undress and remove any jewelry around your neck or chest. You will put on a hospital gown. Sticky electrodes will be placed on your chest. The  electrodes will be connected to an electrocardiogram (ECG) machine to record a tracing of the electrical activity of your heart. A CT scanner will take pictures of your heart. During this time, you will be asked to lie still and hold your breath for 2-3 seconds while a picture of your heart is being taken. The procedure may vary among health care providers and hospitals. What happens after the procedure? You can get dressed. You can return to your normal activities. It is up to you to get the results of your test. Ask your health care provider, or the department that is doing the test, when your results will be ready. Summary A coronary calcium scan is an imaging test used to look for deposits of calcium and other fatty materials (plaques) in the inner lining of the blood vessels of the heart (coronary arteries). Generally, this is a safe procedure. Tell your health care provider if you are pregnant or may be pregnant. No preparation is needed for this procedure. A CT scanner will take pictures of your heart. You can return to your normal activities after the scan is done. This information is not intended to replace advice given to you by your health care provider. Make sure you discuss any questions you have with your health care provider. Document Released: 10/06/2007 Document Revised: 02/27/2016 Document Reviewed: 02/27/2016 Elsevier Interactive Patient Education  2017 Elsevier Inc.  Doppler studies (aorta/iliac, LEA, ABI) in early October 2025  Carotid doppler - 07/2024  ZIO XT- Long Term Monitor Instructions  Your physician has requested you wear a ZIO patch monitor for 7 days.  This is a single patch monitor. [employer]  supplies one patch monitor per enrollment. Additional stickers are not available. Please do not apply patch if you will be having a Nuclear Stress Test,  Echocardiogram, Cardiac CT, MRI, or Chest Xray during the period you would be wearing the  monitor. The patch cannot  be worn during these tests. You cannot remove and re-apply the  ZIO XT patch monitor.  Your ZIO patch monitor will be mailed 3 day USPS to your address on file. It may take 3-5 days  to receive your monitor after you have been enrolled.  Once you have received your monitor, please review the enclosed instructions. Your monitor  has already been registered assigning a specific monitor serial # to you.  Billing and Patient Assistance Program Information  We have supplied [employer] with any of your insurance information on file for billing purposes. [employer] offers a sliding scale Patient Assistance Program for patients that do not have  insurance, or whose insurance does not completely cover the cost of the ZIO monitor.  You must apply for the Patient Assistance Program to qualify for this discounted rate.  To apply, please call [employer] at [phone], select option 4, select option 2, ask to apply for  Patient Assistance Program. Meredeth Ide will ask your household income, and how many people  are in your household. They will quote your out-of-pocket cost based on that information.  [employer] will also be able to set up a 10-month, interest-free payment plan if needed.  Applying the monitor   Shave hair from upper left chest.  Hold abrader disc by orange tab. Rub abrader in 40 strokes over the upper left chest as  indicated in your monitor instructions.  Clean area with 4 enclosed alcohol pads. Let dry.  Apply patch as indicated in monitor instructions. Patch will be placed under collarbone on left  side of chest with arrow pointing upward.  Rub patch adhesive wings for 2 minutes. Remove white label marked "1". Remove the white  label marked "2". Rub patch adhesive wings for 2 additional minutes.  While looking in a mirror, press and release button in center of patch. A small green light will  flash 3-4 times. This will be your only indicator that the monitor has been turned on.  Do not shower  for the first 24 hours. You may shower after the first 24 hours.  Press the button if you feel a symptom. You will hear a small click. Record Date, Time and  Symptom in the Patient Logbook.  When you are ready to remove the patch, follow instructions on the last 2 pages of Patient  Logbook. Stick patch monitor onto the last page of Patient Logbook.  Place Patient Logbook in the blue and white box. Use locking tab on box and tape box closed  securely. The blue and white box has prepaid postage on it. Please place it in the mailbox as  soon as possible. Your physician should have your test results approximately 7 days after the  monitor has been mailed back to [facility].  Call [facility] Customer Care at [phone] if you have questions regarding  your ZIO XT patch monitor. Call them immediately if you see an orange light blinking on your  monitor.  If your monitor falls off in less than 4 days, contact our Monitor department at [phone].  If your monitor becomes loose or falls off after 4 days call [employer] at [phone] for  suggestions on securing your monitor     Follow-Up: At [facility], you and your health needs are our priority.  As part of our continuing mission to provide you with exceptional heart care, our providers are all part of one team.  This team includes your primary Cardiologist (physician) and Advanced Practice Providers or APPs (Physician Assistants and Nurse Practitioners) who all work together to provide you with the care you need, when you need it.  Your next appointment:    6 months with Dr. Allyson Sabal    Please check BP at home twice daily for 1 week and send readings via MyChart  HOW TO TAKE YOUR BLOOD PRESSURE: Rest 5 minutes before taking your blood pressure. Don't smoke or drink caffeinated beverages for at least 30 minutes before. Take your blood pressure before (not after) you eat. Sit comfortably with your back supported and  both feet on the floor (don't cross your legs). Elevate your arm to heart level on a table or a desk. Use the proper sized cuff. It should fit smoothly and snugly around your bare upper arm. There should be enough room to slip a fingertip under the cuff. The bottom edge of the cuff should be 1 inch above the crease of the elbow. Ideally, take 3 measurements at one sitting and record the average.       1st Floor: - Lobby - Registration  - Pharmacy  - Lab - Cafe  2nd Floor: - PV Lab - Diagnostic Testing (echo, CT, nuclear med)  3rd Floor: - Vacant  4th Floor: - TCTS (cardiothoracic surgery) - AFib Clinic - Structural Heart Clinic - Vascular Surgery  - Vascular Ultrasound  5th Floor: - HeartCare Cardiology (general and EP) - Clinical Pharmacy for coumadin, hypertension, lipid, weight-loss medications, and med management appointments    Valet parking services will be available as well.

## 2023-07-31 NOTE — Assessment & Plan Note (Signed)
 History of essential hypertension with blood pressure measured today at 176/58.  He is on amlodipine 5 mg a day.  He checks his blood pressure at home twice a week and usually it is much better than this.

## 2023-07-31 NOTE — Progress Notes (Unsigned)
 Enrolled for [employer] to mail a ZIO XT long term holter monitor to the patients address on file.

## 2023-07-31 NOTE — Assessment & Plan Note (Signed)
 Remote history of tobacco abuse having quit 11 years ago.

## 2023-07-31 NOTE — Assessment & Plan Note (Signed)
 But no PVCs on his EKG today although he is minimally symptomatic from this.  I am going to perform a 1 week Zio patch and obtain a coronary calcium score to her stratify.

## 2023-08-05 ENCOUNTER — Ambulatory Visit
Admission: RE | Admit: 2023-08-05 | Discharge: 2023-08-05 | Disposition: A | Discharge status: Home / Self Care | Payer: Self-pay | Source: Ambulatory Visit | Attending: Cardiovascular Disease | Admitting: Cardiovascular Disease

## 2023-08-05 DIAGNOSIS — I739 Peripheral vascular disease, unspecified: Secondary | ICD-10-CM | POA: Insufficient documentation

## 2023-08-05 DIAGNOSIS — E782 Mixed hyperlipidemia: Secondary | ICD-10-CM | POA: Insufficient documentation

## 2023-08-05 DIAGNOSIS — I10 Essential (primary) hypertension: Secondary | ICD-10-CM | POA: Insufficient documentation

## 2023-08-08 ENCOUNTER — Encounter: Payer: Self-pay | Admitting: Cardiology

## 2023-08-12 ENCOUNTER — Encounter: Payer: Self-pay | Admitting: Cardiovascular Disease

## 2023-08-14 ENCOUNTER — Telehealth: Payer: Self-pay | Admitting: Cardiovascular Disease

## 2023-08-14 NOTE — Telephone Encounter (Signed)
 Jesus Klein called with abnormal CT findings: multiple bilateral subcentimeter pulmonary nodules, recommend non-contrast CT in 3-6 months. If stable at that time in to do a follow up CT in 18 months.

## 2023-08-14 NOTE — Telephone Encounter (Signed)
 [facility] are calling to report an abnormal CT of patient

## 2023-08-15 NOTE — Telephone Encounter (Signed)
 Left message to call back-CT result.

## 2023-08-16 ENCOUNTER — Other Ambulatory Visit: Payer: Self-pay | Admitting: Family Medicine

## 2023-08-16 DIAGNOSIS — R918 Other nonspecific abnormal finding of lung field: Secondary | ICD-10-CM

## 2023-08-21 DIAGNOSIS — I493 Ventricular premature depolarization: Secondary | ICD-10-CM | POA: Diagnosis not present

## 2023-08-23 ENCOUNTER — Other Ambulatory Visit: Payer: Self-pay

## 2023-08-23 DIAGNOSIS — R931 Abnormal findings on diagnostic imaging of heart and coronary circulation: Secondary | ICD-10-CM

## 2023-08-26 ENCOUNTER — Other Ambulatory Visit: Payer: Self-pay | Admitting: Family Medicine

## 2023-08-26 DIAGNOSIS — I6522 Occlusion and stenosis of left carotid artery: Secondary | ICD-10-CM

## 2023-08-26 DIAGNOSIS — I493 Ventricular premature depolarization: Secondary | ICD-10-CM | POA: Diagnosis not present

## 2023-08-26 DIAGNOSIS — R0609 Other forms of dyspnea: Secondary | ICD-10-CM

## 2023-08-26 DIAGNOSIS — I739 Peripheral vascular disease, unspecified: Secondary | ICD-10-CM

## 2023-08-26 NOTE — Telephone Encounter (Signed)
 Prescription Request  08/26/2023  LOV: 04/25/2023  What is the name of the medication or equipment? clopidogrel  (PLAVIX ) 75 MG tablet   Have you contacted your pharmacy to request a refill? Yes   Which pharmacy would you like this sent to?  [id number] [place] [address] [place] [address] Phone: [phone] Fax: [phone]    Patient notified that their request is being sent to the clinical staff for review and that they should receive a response within 2 business days.   Please advise at [facility] [phone]

## 2023-08-28 MED ORDER — CLOPIDOGREL BISULFATE 75 MG PO TABS: 75.0000 mg | ORAL_TABLET | Freq: Every day | ORAL | 0 refills | Status: DC

## 2023-08-28 NOTE — Telephone Encounter (Signed)
 Requested Prescriptions  Pending Prescriptions Disp Refills   clopidogrel  (PLAVIX ) 75 MG tablet 90 tablet 0    Sig: Take 1 tablet (75 mg total) by mouth daily.     Hematology: Antiplatelets - clopidogrel  Failed - 08/28/2023  9:16 AM      Failed - HCT in normal range and within 180 days    HCT  Date Value Ref Range Status  04/25/2023 52.3 (H) 38.5 - 50.0 % Final         Failed - Valid encounter within last 6 months    Recent Outpatient Visits           4 months ago Essential hypertension   [facility] Pickard, Cisco Crest, MD   1 year ago Dyspnea on exertion   [facility] Austine Lefort, MD   1 year ago Presbycusis of both ears   [facility] Austine Lefort, MD   1 year ago PAD (peripheral artery disease) [facility])   [facility] Pickard, Cisco Crest, MD              Passed - HGB in normal range and within 180 days    Hemoglobin  Date Value Ref Range Status  04/25/2023 16.5 13.2 - 17.1 g/dL Final         Passed - PLT in normal range and within 180 days    Platelets  Date Value Ref Range Status  04/25/2023 290 140 - 400 Thousand/uL Final   Platelet Count, POC  Date Value Ref Range Status  03/11/2013 268.0 142 - 424 K/uL Final         Passed - Cr in normal range and within 360 days    Creat  Date Value Ref Range Status  04/25/2023 1.06 0.70 - 1.28 mg/dL Final

## 2023-09-05 ENCOUNTER — Ambulatory Visit: Payer: Self-pay

## 2023-09-20 ENCOUNTER — Other Ambulatory Visit: Payer: Self-pay | Admitting: Family Medicine

## 2023-09-21 NOTE — Telephone Encounter (Signed)
 Requested Prescriptions  Pending Prescriptions Disp Refills   atorvastatin  (LIPITOR ) 80 MG tablet [Pharmacy Med Name: ATORVASTATIN  80 MG TABLET] 90 tablet 2    Sig: TAKE 1 TABLET BY MOUTH EVERY DAY     Cardiovascular:  Antilipid - Statins Failed - 09/21/2023  4:32 PM      Failed - Valid encounter within last 12 months    Recent Outpatient Visits           4 months ago Essential hypertension   [place] [facility] Austine Lefort, MD   1 year ago Dyspnea on exertion   [place] [facility] Austine Lefort, MD   1 year ago Presbycusis of both ears   [place] [facility] Pickard, Cisco Crest, MD   1 year ago PAD (peripheral artery disease) [facility])   [facility] Pickard, Cisco Crest, MD       Future Appointments             In 2 weeks Katheryne Pane Frederico Jan, MD [facility], H&[facility]            Failed - Lipid Panel in normal range within the last 12 months    Cholesterol, Total  Date Value Ref Range Status  08/29/2022 150 100 - 199 mg/dL Final   Cholesterol  Date Value Ref Range Status  04/25/2023 122 <200 mg/dL Final   LDL Cholesterol (Calc)  Date Value Ref Range Status  04/25/2023 59 mg/dL (calc) Final    Comment:    Reference range: <100 . Desirable range <100 mg/dL for primary prevention;   <70 mg/dL for patients with CHD or diabetic patients  with > or = 2 CHD risk factors. Aaron Aas LDL-C is now calculated using the Martin-Hopkins  calculation, which is a validated novel method providing  better accuracy than the Friedewald equation in the  estimation of LDL-C.  Melinda Sprawls et al. Erroll Heard. 3244;010(27): 2061-2068  (http://education.[employer].com/faq/FAQ164)    HDL  Date Value Ref Range Status  04/25/2023 44 > OR = 40 mg/dL Final  25/36/6440 53 >34 mg/dL Final   Triglycerides  Date Value Ref Range Status  04/25/2023 103 <150 mg/dL Final          Passed - Patient is not pregnant

## 2023-09-24 ENCOUNTER — Encounter (HOSPITAL_COMMUNITY): Payer: Self-pay

## 2023-09-26 ENCOUNTER — Ambulatory Visit
Admission: RE | Admit: 2023-09-26 | Discharge: 2023-09-26 | Disposition: A | Discharge status: Home / Self Care | Source: Ambulatory Visit | Attending: Cardiovascular Disease | Admitting: Cardiovascular Disease

## 2023-09-26 DIAGNOSIS — I2510 Atherosclerotic heart disease of native coronary artery without angina pectoris: Secondary | ICD-10-CM

## 2023-09-26 DIAGNOSIS — I700 Atherosclerosis of aorta: Secondary | ICD-10-CM | POA: Insufficient documentation

## 2023-09-26 DIAGNOSIS — D3501 Benign neoplasm of right adrenal gland: Secondary | ICD-10-CM | POA: Insufficient documentation

## 2023-09-26 DIAGNOSIS — N281 Cyst of kidney, acquired: Secondary | ICD-10-CM | POA: Diagnosis not present

## 2023-09-26 DIAGNOSIS — R931 Abnormal findings on diagnostic imaging of heart and coronary circulation: Secondary | ICD-10-CM | POA: Diagnosis not present

## 2023-09-26 DIAGNOSIS — R918 Other nonspecific abnormal finding of lung field: Secondary | ICD-10-CM | POA: Insufficient documentation

## 2023-09-26 DIAGNOSIS — D3502 Benign neoplasm of left adrenal gland: Secondary | ICD-10-CM | POA: Insufficient documentation

## 2023-09-26 MED ORDER — RUBIDIUM RB82 GENERATOR (RUBYFILL)
25.0000 | PACK | Freq: Once | INTRAVENOUS | Status: AC
  Administered 2023-09-26: 22.53 via INTRAVENOUS

## 2023-09-26 MED ORDER — RUBIDIUM RB82 GENERATOR (RUBYFILL)
25.0000 | PACK | Freq: Once | INTRAVENOUS | Status: AC
  Administered 2023-09-26: 22.54 via INTRAVENOUS

## 2023-09-26 MED ORDER — REGADENOSON 0.4 MG/5ML IV SOLN
0.4000 mg | Freq: Once | INTRAVENOUS | Status: AC
  Administered 2023-09-26: 0.4 mg via INTRAVENOUS
  Filled 2023-09-26: qty 5

## 2023-09-27 ENCOUNTER — Ambulatory Visit: Payer: Self-pay | Admitting: Cardiology

## 2023-09-27 LAB — NM PET CT CARDIAC PERFUSION MULTI W/ABSOLUTE BLOODFLOW
LV dias vol: 187 mL (ref 62–150)
LV sys vol: 124 mL
MBFR: 2.34
Nuc Rest EF: 28 %
Nuc Stress EF: 34 %
Peak HR: 97 {beats}/min
Rest HR: 93 {beats}/min
Rest MBF: 0.99 ml/g/min
Rest Nuclear Isotope Dose: 22.5 mCi
SRS: 0
SSS: 0
ST Depression (mm): 0 mm
Stress MBF: 2.32 ml/g/min
Stress Nuclear Isotope Dose: 22.5 mCi
TID: 1

## 2023-09-27 NOTE — Progress Notes (Signed)
 Although the LV function is reduced, the perfusion and coronary reserve is normal suggesting no significant CAD. Low risk

## 2023-10-09 ENCOUNTER — Encounter: Payer: Self-pay | Admitting: Cardiovascular Disease

## 2023-10-09 ENCOUNTER — Ambulatory Visit: Attending: Cardiology | Admitting: Cardiovascular Disease

## 2023-10-09 ENCOUNTER — Other Ambulatory Visit: Payer: Self-pay | Admitting: Family Medicine

## 2023-10-09 VITALS — BP 152/62 | HR 73 | Ht 71.0 in | Wt 188.0 lb

## 2023-10-09 DIAGNOSIS — R931 Abnormal findings on diagnostic imaging of heart and coronary circulation: Secondary | ICD-10-CM | POA: Insufficient documentation

## 2023-10-09 DIAGNOSIS — E782 Mixed hyperlipidemia: Secondary | ICD-10-CM

## 2023-10-09 DIAGNOSIS — Z87891 Personal history of nicotine dependence: Secondary | ICD-10-CM | POA: Diagnosis not present

## 2023-10-09 DIAGNOSIS — I739 Peripheral vascular disease, unspecified: Secondary | ICD-10-CM

## 2023-10-09 DIAGNOSIS — I4729 Other ventricular tachycardia: Secondary | ICD-10-CM

## 2023-10-09 DIAGNOSIS — I10 Essential (primary) hypertension: Secondary | ICD-10-CM

## 2023-10-09 DIAGNOSIS — I493 Ventricular premature depolarization: Secondary | ICD-10-CM | POA: Diagnosis not present

## 2023-10-09 MED ORDER — METOPROLOL SUCCINATE ER 25 MG PO TB24: 25.0000 mg | ORAL_TABLET | Freq: Every day | ORAL | 3 refills | Status: AC

## 2023-10-09 NOTE — Assessment & Plan Note (Signed)
 Quit 11 years ago

## 2023-10-09 NOTE — Progress Notes (Signed)
 10/09/2023 TELLER [place]   [birth year]  [id number]  Primary Physician [name] Cisco Crest, MD Primary Cardiologist: [name] MD [name], [place]  HPI:  Jesus Klein is a 73 y.o.  thin-appearing Caucasian male father of 2 children, grandfather and 2 grandchildren referred by [name] PA-C for peripheral vascular evaluation.I last saw him in the office 07/31/23. I initially saw him in the office 08/29/2022.  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  He had Dopplers performed in the office today that showed high-grade ostial carotid, ostial/subclavian artery stenosis as well as an occluded left common iliac and high-grade right iliac stenosis. His cardiovascular risk factor profile is notable for a 30-pack-year history of tobacco abuse having stopped smoking 2 years ago as well as 2 hypertension. There is no family history of heart disease. He does have mild hyperlipidemia. He has never had a heart attack or stroke. Denies chest pain or shortness of breath. He did have a right total hip replacement performed by Dr. Julio Ohm in 2007. He noticed lifestyle limiting claudication 6 months ago right greater than left. On 03/04/14 I performed aortic arch angiography, selective left carotid angiography, abdominal aortography as well as right common iliac and external iliac intervention. His right lower extremity claudication has completely resolved. His ABI has normalized. He does not notice claudication as much in his left leg. Because of his high grade near ostial left common carotid artery stenosis heunderwent PTCA and stenting on 04/28/14 with a 7 mm x 19 mm long Omnilink balloon expandable stent with excellent result. Follow-up lower extremity Dopplers and carotid Dopplers performed 05/23/17 suggested in-stent restenosis within the iliac stents are widely patent carotid stent.  His most recent Dopplers  performed in January 2020 revealed patent carotid stent, and moderate in-stent restenosis within the right iliac stent although he denies claudication.    His most recent carotid Dopplers suggest patency of his left carotid stent.  Does have a high-frequency signal in his left subclavian artery with symmetric upper extremity blood pressures.  His aortoiliac Dopplers performed 07/23/23 show occlusion of his left common iliac artery which was known from angiography in 2015 with progression of disease in his right common and external iliac artery.   Since I saw him 2 months ago I did get a coronary calcium  score 4/23//25 which was 2879 most of which was in the LAD.  This led to a cardiac PET study 10/09/2023 which showed normal perfusion with an EF of 34%.  An event monitor showed 19% PVC burden with frequent episodes of nonsustained ventricular tachycardia.  Current Meds  Medication Sig   amLODipine  (NORVASC ) 5 MG tablet Take 1 tablet (5 mg total) by mouth daily.   atorvastatin  (LIPITOR ) 80 MG tablet TAKE 1 TABLET BY MOUTH EVERY DAY   cetirizine (ZYRTEC) 10 MG tablet Take 10 mg by mouth daily as needed.    Cholecalciferol (VITAMIN D ) 125 MCG (5000 UT) CAPS Take 5,000 Units by mouth daily.    clopidogrel  (PLAVIX ) 75 MG tablet Take 1 tablet (75 mg total) by mouth daily.   Coenzyme Q10 300 MG CAPS Take 300 mg by mouth daily.    ezetimibe  (ZETIA ) 10 MG tablet TAKE 1 TABLET BY MOUTH EVERY DAY   fluticasone -salmeterol (ADVAIR DISKUS) 250-50 MCG/ACT AEPB Inhale 1 puff into the lungs in the morning and at bedtime.  metoprolol succinate (TOPROL XL) 25 MG 24 hr tablet Take 1 tablet (25 mg total) by mouth daily.   Multiple Vitamin (MULTIVITAMIN WITH MINERALS) TABS tablet Take 1 tablet by mouth daily.   polyvinyl alcohol (LIQUIFILM TEARS) 1.4 % ophthalmic solution Place 1 drop into both eyes daily as needed for dry eyes (allergies).     No Known Allergies  Social History   Socioeconomic History   Marital  status: Married    Spouse name: Mariah Shines   Number of children: 2   Years of education: Not on file   Highest education level: Bachelor's degree (e.g., BA, AB, BS)  Occupational History   Not on file  Tobacco Use   Smoking status: Former    Current packs/day: 0.00    Average packs/day: 1.5 packs/day for 30.0 years (45.0 ttl pk-yrs)    Types: Cigarettes    Start date: 01/20/1979    Quit date: 01/19/2009    Years since quitting: 14.7   Smokeless tobacco: Never  Vaping Use   Vaping status: Never Used  Substance and Sexual Activity   Alcohol use: Yes    Alcohol/week: 2.0 standard drinks of alcohol    Types: 2 Cans of beer per week   Drug use: No   Sexual activity: Yes    Birth control/protection: None  Other Topics Concern   Not on file  Social History Narrative   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 He may occasionally lift 20 pounds but most is done by machine.    Mix of standing, sitting, moving around.      Used to walk a lot and do a lot outside. Got a treadmill to use during cold weather.   Married.   2 daughters- grown.         Social Drivers of Corporate investment banker Strain: Low Risk  (04/18/2023)   Overall Financial Resource Strain (CARDIA)    Difficulty of Paying Living Expenses: Not hard at all  Food Insecurity: No Food Insecurity (04/18/2023)   Hunger Vital Sign    Worried About Running Out of Food in the Last Year: Never true    Ran Out of Food in the Last Year: Never true  Transportation Needs: No Transportation Needs (04/18/2023)   PRAPARE - Administrator, Civil Service (Medical): No    Lack of Transportation (Non-Medical): No  Physical Activity: Sufficiently Active (04/18/2023)   Exercise Vital Sign    Days of Exercise per Week: 6 days    Minutes of Exercise per Session: 30 min  Stress: No Stress Concern Present (04/18/2023)   [employer] of Occupational Health - Occupational  Stress Questionnaire    Feeling of Stress : Only a little  Social Connections: Moderately Isolated (04/18/2023)   Social Connection and Isolation Panel    Frequency of Communication with Friends and Family: Once a week    Frequency of Social Gatherings with Friends and Family: Once a week    Attends Religious Services: More than 4 times per year    Active Member of [employer] or Organizations: No    Attends Banker Meetings: Not on file    Marital Status: Married  Intimate Partner Violence: Not At Risk (02/23/2021)   Humiliation, Afraid, Rape, and Kick questionnaire    Fear of Current or Ex-Partner: No    Emotionally Abused: No    Physically Abused: No    Sexually Abused: No  Review of Systems: General: negative for chills, fever, night sweats or weight changes.  Cardiovascular: negative for chest pain, dyspnea on exertion, edema, orthopnea, palpitations, paroxysmal nocturnal dyspnea or shortness of breath Dermatological: negative for rash Respiratory: negative for cough or wheezing Urologic: negative for hematuria Abdominal: negative for nausea, vomiting, diarrhea, bright red blood per rectum, melena, or hematemesis Neurologic: negative for visual changes, syncope, or dizziness All other systems reviewed and are otherwise negative except as noted above.    Blood pressure (!) 152/62, pulse 73, height 5' 11 (1.803 m), weight 188 lb (85.3 kg), SpO2 93%.  General appearance: alert and no distress Neck: no adenopathy, no JVD, supple, symmetrical, trachea midline, thyroid not enlarged, symmetric, no tenderness/mass/nodules, and bilateral carotid bruits Ella subclavian Krimson Massmann Lungs: clear to auscultation bilaterally Heart: regular rate and rhythm, S1, S2 normal, no murmur, click, rub or gallop Extremities: extremities normal, atraumatic, no cyanosis or edema Pulses: Diminished pedal pulses Skin: Skin color, texture, turgor normal. No rashes or lesions Neurologic: Grossly  normal  EKG not performed today      ASSESSMENT AND PLAN:   Essential hypertension History of essential hypertension with blood pressure measured today at 152/62.  He is on amlodipine .  Hyperlipidemia History of hyperlipidemia on statin therapy and Zetia  with lipid profile performed 04/25/2023 revealing total cholesterol 122, LDL 59 and HDL 44.  Claudication of lower extremity (HCC) History of PAD status post angiography by myself 03/04/2014 revealing occluded left common iliac artery with high-grade right common and external iliac artery stenoses both of which were stented.  His right lower extremity claudication resolved.  Lower extremity arterial Doppler studies performed/1/25 revealed a right ABI of 0.88, left 0.62 with an occluded left common iliac and high-grade right common and external iliac artery stenosis.  He denies claudication.  Will continue to follow this on an annual basis.  PVD - Carotid, subclavian, bilat LE disease None left subclavian artery stenosis which is asymptomatic.  Status post left common carotid stenting by myself 04/28/2014 which has remained patent by duplex ultrasound.  Does have bilateral carotid bruits.  History of tobacco abuse Quit 11 years ago  PVC's (premature ventricular contractions) Event monitor showed 19% PVC burden with frequent episodes of nonsustained ventricular tachycardia which he feels intermittently.  He has not had syncope.  I am going to give him a low-dose beta-blocker.  I am also going to refer him to EP for further evaluation.  Elevated coronary artery calcium  score Elevated coronary calcium  score coronary calcium  score performed 08/05/23 was 2879 the majority of which was in the LAD.  He is completely asymptomatic.  Based on this, I did get a cardiac PET study which showed normal perfusion with an EF of 34%.  I am going to get a 2D echo to further evaluate.     [name] MD FACP,FACC,FAHA, [facility] 10/09/2023 11:00 AM

## 2023-10-09 NOTE — Assessment & Plan Note (Signed)
 History of hyperlipidemia on statin therapy and Zetia  with lipid profile performed 04/25/2023 revealing total cholesterol 122, LDL 59 and HDL 44.

## 2023-10-09 NOTE — Assessment & Plan Note (Signed)
 None left subclavian artery stenosis which is asymptomatic.  Status post left common carotid stenting by myself 04/28/2014 which has remained patent by duplex ultrasound.  Does have bilateral carotid bruits.

## 2023-10-09 NOTE — Assessment & Plan Note (Signed)
 History of PAD status post angiography by myself 03/04/2014 revealing occluded left common iliac artery with high-grade right common and external iliac artery stenoses both of which were stented.  His right lower extremity claudication resolved.  Lower extremity arterial Doppler studies performed/1/25 revealed a right ABI of 0.88, left 0.62 with an occluded left common iliac and high-grade right common and external iliac artery stenosis.  He denies claudication.  Will continue to follow this on an annual basis.

## 2023-10-09 NOTE — Patient Instructions (Signed)
 Medication Instructions:  Your physician has recommended you make the following change in your medication:   -Start metoprolol succinate (Toprol- XL) 25mg  once daily.   *If you need a refill on your cardiac medications before your next appointment, please call your pharmacy*   Testing/Procedures: Your physician has requested that you have an echocardiogram. Echocardiography is a painless test that uses sound waves to create images of your heart. It provides your doctor with information about the size and shape of your heart and how well your heart's chambers and valves are working. This procedure takes approximately one hour. There are no restrictions for this procedure. Please do NOT wear cologne, perfume, aftershave, or lotions (deodorant is allowed). Please arrive 15 minutes prior to your appointment time.  Please note: We ask at that you not bring children with you during ultrasound (echo/ vascular) testing. Due to room size and safety concerns, children are not allowed in the ultrasound rooms during exams. Our front office staff cannot provide observation of children in our lobby area while testing is being conducted. An adult accompanying a patient to their appointment will only be allowed in the ultrasound room at the discretion of the ultrasound technician under special circumstances. We apologize for any inconvenience.   Follow-Up: At [facility], you and your health needs are our priority.  As part of our continuing mission to provide you with exceptional heart care, our providers are all part of one team.  This team includes your primary Cardiologist (physician) and Advanced Practice Providers or APPs (Physician Assistants and Nurse Practitioners) who all work together to provide you with the care you need, when you need it.  Your next appointment:   6 month(s)  Provider:   Lauro Portal, MD    We recommend signing up for the patient portal called MyChart.  Sign up  information is provided on this After Visit Summary.  MyChart is used to connect with patients for Virtual Visits (Telemedicine).  Patients are able to view lab/test results, encounter notes, upcoming appointments, etc.  Non-urgent messages can be sent to your provider as well.   To learn more about what you can do with MyChart, go to [email or web address].

## 2023-10-09 NOTE — Assessment & Plan Note (Signed)
 Event monitor showed 19% PVC burden with frequent episodes of nonsustained ventricular tachycardia which he feels intermittently.  He has not had syncope.  I am going to give him a low-dose beta-blocker.  I am also going to refer him to EP for further evaluation.

## 2023-10-09 NOTE — Assessment & Plan Note (Signed)
 History of essential hypertension with blood pressure measured today at 152/62.  He is on amlodipine .

## 2023-10-09 NOTE — Assessment & Plan Note (Signed)
 Elevated coronary calcium  score coronary calcium  score performed 08/05/23 was 2879 the majority of which was in the LAD.  He is completely asymptomatic.  Based on this, I did get a cardiac PET study which showed normal perfusion with an EF of 34%.  I am going to get a 2D echo to further evaluate.

## 2023-10-14 NOTE — Progress Notes (Signed)
 Electrophysiology Office Note:    Date:  10/15/2023   ID:  Jesus Klein, DOB 1950/11/13, MRN [id number]  PCP:  Duanne Butler DASEN, MD   [facility] Providers Cardiologist:  Dorn Lesches, MD     Referring MD: Lesches Dorn PARAS, MD   History of Present Illness:    Jesus Klein is a 73 y.o. male with a medical history significant for frequent PVCs, peripheral vascular disease, hypertension, hyperlipidemia, referred for management of PVCs.     He has significant peripheral vascular disease and has had a right common iliac and external iliac intervention by Dr. Wadie.  He also had a high-grade left common carotid artery stenosis treated with PTCA and stenting.  Monitor was placed in April 2025 that showed frequent (19%) ventricular ectopy.  Metoprolol was started and he was referred to electrophysiology.  Discussed the use of AI scribe software for clinical note transcription with the patient, who gave verbal consent to proceed.  History of Present Illness Jesus Klein is a 73 year old male with peripheral vascular disease, hypertension, and hyperlipidemia who presents for evaluation of frequent premature ventricular contractions (PVCs). Referred by Dr. Lesches for evaluation of frequent PVCs.  He has been experiencing frequent premature ventricular contractions (PVCs), with a monitored report in April 2025 showing a 90% burden of ventricular ectopy. He describes episodes of rapid heart rate, likening it to feeling like 'you'd had a scare.'  He was started on metoprolol approximately one week ago and notes that his heart rate is not as rapid as it was prior to starting the medication. He has not noticed a significant change in his quality of life since starting metoprolol.  He has a history of chronic bronchitis since childhood, which causes worsening breathlessness with exertion. No recent worsening of these symptoms. He uses Advair inhaler twice daily and does not notice any  change in his heart racing with its use. He previously used Tremfya, which caused jitteriness, leading to the switch to Advair.  A PET scan showed a high calcium  level, and he recalls being told that his heart function was slightly reduced. An echocardiogram has been scheduled for November 21, 2023, to further evaluate heart function.         Today, He reports that he's doing well and has not complaints  EKGs/Labs/Other Studies Reviewed Today:     Echocardiogram:  TTE Pending   Monitors:  7 day monitor April 2025-- my interpretation Sinus rhythm heart rate 48 to 138 bpm, average 85 bpm 19.2% PVC burden.  Approximately 3000 episodes of nonsustained VT, longest 4 seconds. Patient symptom episodes correlate with frequent ventricular ectopy  Stress testing:  See PET scan   Advanced imaging:  PET/CT June 2025 Rest EF 28%, stress EF 34%.  Normal LV perfusion.    EKG:         Physical Exam:    VS:  BP (!) 142/70   Pulse 89   Ht 5' 11 (1.803 m)   Wt 188 lb (85.3 kg)   SpO2 92%   BMI 26.22 kg/m     Wt Readings from Last 3 Encounters:  10/15/23 188 lb (85.3 kg)  10/09/23 188 lb (85.3 kg)  07/31/23 190 lb (86.2 kg)     GEN:  Well nourished, well developed in no acute distress CARDIAC: RRR with occasional extrasystoles, no murmurs, rubs, gallops RESPIRATORY:  Normal work of breathing MUSCULOSKELETAL: no edema    ASSESSMENT & PLAN:  Frequent PVCs, NSVT Burden approximately 20% Decreased EF on PET scan --TTE is pending Mild symptoms, occasionally heart racing.  No syncope, presyncope Will order TSH, BMP magnesium Start magnesium taurate Continue metoprolol XL 25mg  for now  CHF with reduced EF --EF 34% on cardiac PET scan TTE is pending Continue metoprolol XL 25mg     Diffuse vascular disease Pt follows with Dr. Court     Signed, Eulas FORBES Furbish, MD  10/15/2023 11:31 AM    [facility]

## 2023-10-15 ENCOUNTER — Encounter: Payer: Self-pay | Admitting: Cardiovascular Disease

## 2023-10-15 ENCOUNTER — Ambulatory Visit: Attending: Cardiovascular Disease | Admitting: Cardiovascular Disease

## 2023-10-15 VITALS — BP 142/70 | HR 89 | Ht 71.0 in | Wt 188.0 lb

## 2023-10-15 DIAGNOSIS — I493 Ventricular premature depolarization: Secondary | ICD-10-CM | POA: Diagnosis not present

## 2023-10-15 DIAGNOSIS — I5020 Unspecified systolic (congestive) heart failure: Secondary | ICD-10-CM | POA: Diagnosis not present

## 2023-10-15 MED ORDER — MAGNESIUM 400 MG PO TABS: 400.0000 mg | ORAL_TABLET | Freq: Every day | ORAL | 3 refills | Status: AC

## 2023-10-15 NOTE — Patient Instructions (Signed)
 Medication Instructions:  START Magnesium Taurate 400 mg once daily - this can be difficult to find locally but may be purchased on [email or web address] or [email or web address] *If you need a refill on your cardiac medications before your next appointment, please call your pharmacy*  Lab Work: TSH, BMP, and Mg TODAY - please have pre-procedure lab work completed at [facility] on the first floor  If you have labs (blood work) drawn today and your tests are completely normal, you will receive your results only by: MyChart Message (if you have MyChart) OR A paper copy in the mail If you have any lab test that is abnormal or we need to change your treatment, we will call you to review the results.  Follow-Up: At [facility], you and your health needs are our priority.  As part of our continuing mission to provide you with exceptional heart care, our providers are all part of one team.  This team includes your primary Cardiologist (physician) and Advanced Practice Providers or APPs (Physician Assistants and Nurse Practitioners) who all work together to provide you with the care you need, when you need it.  Your next appointment:   4 week(s) - after echocardiogram is complete  Provider:   Eulas Furbish, MD

## 2023-10-16 ENCOUNTER — Other Ambulatory Visit: Payer: Self-pay | Admitting: *Deleted

## 2023-10-16 ENCOUNTER — Telehealth: Payer: Self-pay | Admitting: Cardiovascular Disease

## 2023-10-16 ENCOUNTER — Telehealth: Payer: Self-pay | Admitting: Student in an Organized Health Care Education/Training Program

## 2023-10-16 ENCOUNTER — Ambulatory Visit: Payer: Self-pay

## 2023-10-16 DIAGNOSIS — Z79899 Other long term (current) drug therapy: Secondary | ICD-10-CM

## 2023-10-16 DIAGNOSIS — E875 Hyperkalemia: Secondary | ICD-10-CM | POA: Diagnosis not present

## 2023-10-16 LAB — BASIC METABOLIC PANEL WITH GFR
BUN/Creatinine Ratio: 15 (ref 10–24)
BUN/Creatinine Ratio: 15 (ref 10–24)
BUN: 16 mg/dL (ref 8–27)
BUN: 16 mg/dL (ref 8–27)
CO2: 23 mmol/L (ref 20–29)
CO2: 21 mmol/L (ref 20–29)
Calcium: 10.4 mg/dL — ABNORMAL HIGH (ref 8.6–10.2)
Calcium: 9.7 mg/dL (ref 8.6–10.2)
Chloride: 102 mmol/L (ref 96–106)
Chloride: 103 mmol/L (ref 96–106)
Creatinine, Ser: 1.09 mg/dL (ref 0.76–1.27)
Creatinine, Ser: 1.05 mg/dL (ref 0.76–1.27)
Glucose: 97 mg/dL (ref 70–99)
Glucose: 83 mg/dL (ref 70–99)
Potassium: 5.9 mmol/L (ref 3.5–5.2)
Potassium: 4.5 mmol/L (ref 3.5–5.2)
Sodium: 141 mmol/L (ref 134–144)
Sodium: 139 mmol/L (ref 134–144)
eGFR: 73 mL/min/{1.73_m2} (ref >59)
eGFR: 76 mL/min/{1.73_m2} (ref >59)

## 2023-10-16 LAB — TSH RFX ON ABNORMAL TO FREE T4: TSH: 1.1 u[IU]/mL (ref 0.450–4.500)

## 2023-10-16 LAB — MAGNESIUM: Magnesium: 2.1 mg/dL (ref 1.6–2.3)

## 2023-10-16 NOTE — Telephone Encounter (Signed)
[facility] calling with critical lab results

## 2023-10-16 NOTE — Telephone Encounter (Signed)
 Received call from Jerrod from [facility]. Informed that pt has a Potassium level of 5.9.  Called pt to get labs redrawn. Pt verbalized understanding.

## 2023-10-16 NOTE — Telephone Encounter (Signed)
 HyperK (5.9) on routine lab eval with Dr. Nancey for PVCs. Called and left VM for Mr. Rhoads to have it rechecked. BMP ordered. Did not reach patient.

## 2023-10-16 NOTE — Progress Notes (Signed)
 Lab called stating that Dr. Nancey ordered for this pt to get a BMET done today to reassess recent high potassium level.  Was able to see where the order was placed today but unable to release this.   Lab aware I will input a new BMET under Dr. Marko name and release this accordingly, so that they can draw this on the pt now.

## 2023-10-17 ENCOUNTER — Ambulatory Visit: Payer: Self-pay

## 2023-10-28 ENCOUNTER — Telehealth: Payer: Self-pay

## 2023-10-28 NOTE — Telephone Encounter (Signed)
 Copied from CRM [id number]. Topic: Referral - Question >> Oct 28, 2023 11:26 AM Essie A wrote: Reason for CRM: Reason for CRM: Janella from [facility] called regarding a referral for a  CT CHEST LCS NODULE F/U LOW DOSE WO CONTRAST.  There is no record of patient having a CT lung scan in Epic.  If there is one, patient will need to wait 6 months for a FU.  Please call with any questions at [phone] ext [phone], if no answer choose option 1 and 4.

## 2023-11-06 ENCOUNTER — Ambulatory Visit (HOSPITAL_COMMUNITY)
Admission: RE | Admit: 2023-11-06 | Discharge: 2023-11-06 | Disposition: A | Discharge status: Home / Self Care | Source: Ambulatory Visit | Attending: Cardiovascular Disease | Admitting: Cardiovascular Disease

## 2023-11-06 ENCOUNTER — Ambulatory Visit: Payer: Self-pay | Admitting: Cardiovascular Disease

## 2023-11-06 DIAGNOSIS — I10 Essential (primary) hypertension: Secondary | ICD-10-CM | POA: Insufficient documentation

## 2023-11-06 DIAGNOSIS — E782 Mixed hyperlipidemia: Secondary | ICD-10-CM | POA: Insufficient documentation

## 2023-11-06 DIAGNOSIS — Z87891 Personal history of nicotine dependence: Secondary | ICD-10-CM | POA: Insufficient documentation

## 2023-11-06 DIAGNOSIS — I493 Ventricular premature depolarization: Secondary | ICD-10-CM | POA: Insufficient documentation

## 2023-11-06 LAB — ECHOCARDIOGRAM COMPLETE
AR max vel: 1.47 cm2
AV Area VTI: 1.56 cm2
AV Area mean vel: 1.59 cm2
AV Mean grad: 5.9 mmHg
AV Peak grad: 13.7 mmHg
Ao pk vel: 1.85 m/s
Area-P 1/2: 2.74 cm2
S' Lateral: 2.9 cm

## 2023-11-06 NOTE — Progress Notes (Signed)
*  PRELIMINARY RESULTS* Echocardiogram 2D Echocardiogram has been performed.  Jesus Klein 11/06/2023, 10:21 AM

## 2023-11-13 NOTE — Progress Notes (Unsigned)
 Electrophysiology Office Note:    Date:  11/14/2023   ID:  ZAIYDEN STROZIER, DOB [birth year], MRN [id number]  PCP:  Duanne Butler DASEN, MD   [facility] Providers Cardiologist:  Dorn Lesches, MD Electrophysiologist:  Eulas FORBES Furbish, MD     Referring MD: Duanne Butler DASEN, MD   History of Present Illness:    Jesus Klein is a 73 y.o. male with a medical history significant for frequent PVCs, peripheral vascular disease, hypertension, hyperlipidemia, referred for management of PVCs.     He has significant peripheral vascular disease and has had a right common iliac and external iliac intervention by Dr. Wadie.  He also had a high-grade left common carotid artery stenosis treated with PTCA and stenting.  Monitor was placed in April 2025 that showed frequent (19%) ventricular ectopy.  Metoprolol  was started and he was referred to electrophysiology.  Discussed the use of AI scribe software for clinical note transcription with the patient, who gave verbal consent to proceed.  History of Present Illness Jesus Klein is a 73 year old male with peripheral vascular disease, hypertension, and hyperlipidemia who presents for evaluation of frequent premature ventricular contractions (PVCs). Referred by Dr. Lesches for evaluation of frequent PVCs.  He has been experiencing frequent premature ventricular contractions (PVCs), with a monitored report in April 2025 showing a 90% burden of ventricular ectopy. He describes episodes of rapid heart rate, likening it to feeling like 'you'd had a scare.'  He was started on metoprolol  approximately one week ago and notes that his heart rate is not as rapid as it was prior to starting the medication. He has not noticed a significant change in his quality of life since starting metoprolol .  He has a history of chronic bronchitis since childhood, which causes worsening breathlessness with exertion. No recent worsening of these symptoms. He uses  Advair inhaler twice daily and does not notice any change in his heart racing with its use. He previously used Tremfya, which caused jitteriness, leading to the switch to Advair.  A PET scan showed a high calcium  level, and his EF was reported as being 34%.  Since our last visit, TTE showed normal EF, 60 to 65%.  He reports that the metoprolol  and magnesium  have significantly improved his symptoms and he no longer experiences palpitations or rapid heartbeats.         Today, He reports that he's doing well and has no complaints.  EKGs/Labs/Other Studies Reviewed Today:     Echocardiogram:  TTE: November 06, 2023 LVEF 60 to 65%.  Moderate LVH.  Normal RV function.   Monitors:  7 day monitor April 2025-- my interpretation Sinus rhythm heart rate 48 to 138 bpm, average 85 bpm 19.2% PVC burden.  Approximately 3000 episodes of nonsustained VT, longest 4 seconds. Patient symptom episodes correlate with frequent ventricular ectopy  Stress testing:  See PET scan   Advanced imaging:  PET/CT June 2025 Rest EF 28%, stress EF 34%.  Normal LV perfusion.    EKG:   EKG Interpretation Date/Time:  Thursday November 14 2023 08:44:29 EDT Ventricular Rate:  88 PR Interval:  158 QRS Duration:  88 QT Interval:  368 QTC Calculation: 445 R Axis:   73  Text Interpretation: Sinus rhythm with frequent Premature ventricular complexes Possible Left atrial enlargement Nonspecific ST abnormality When compared with ECG of 15-Oct-2023 10:53, Questionable change in QRS axis Confirmed by Furbish Eulas [phone]) on 11/14/2023 9:03:06 AM     Physical Exam:  VS:  BP (!) 142/82 (BP Location: Left Arm, Patient Position: Sitting, Cuff Size: Large)   Pulse 88   Ht 5' 11 (1.803 m)   Wt 187 lb (84.8 kg)   SpO2 96%   BMI 26.08 kg/m     Wt Readings from Last 3 Encounters:  11/14/23 187 lb (84.8 kg)  10/15/23 188 lb (85.3 kg)  10/09/23 188 lb (85.3 kg)     GEN:  Well nourished, well developed in no  acute distress CARDIAC: RRR with occasional extrasystoles, no murmurs, rubs, gallops RESPIRATORY:  Normal work of breathing MUSCULOSKELETAL: no edema    ASSESSMENT & PLAN:     Frequent PVCs, NSVT Burden approximately 20% initially Decreased EF on PET scan --follow-up TTE shows normal EF Mild symptoms, occasionally heart racing --improved, almost resolved, with metoprolol  and magnesium  supplementation 2 morphologies noted on ECG today Continue magnesium  taurate Continue metoprolol  XL 25mg    CHF with reduced EF --EF 34% on cardiac PET scan EF normal on TTE 11/06/2023 Continue metoprolol  XL 25mg     Diffuse vascular disease Pt follows with Dr. Court     Signed, Eulas FORBES Furbish, MD  11/14/2023 9:03 AM    [facility]

## 2023-11-14 ENCOUNTER — Ambulatory Visit: Attending: Cardiovascular Disease | Admitting: Cardiovascular Disease

## 2023-11-14 ENCOUNTER — Encounter: Payer: Self-pay | Admitting: Cardiovascular Disease

## 2023-11-14 VITALS — BP 142/82 | HR 88 | Ht 71.0 in | Wt 187.0 lb

## 2023-11-14 DIAGNOSIS — I4729 Other ventricular tachycardia: Secondary | ICD-10-CM

## 2023-11-14 DIAGNOSIS — I493 Ventricular premature depolarization: Secondary | ICD-10-CM | POA: Diagnosis not present

## 2023-11-14 NOTE — Patient Instructions (Signed)
 Medication Instructions:  Your physician recommends that you continue on your current medications as directed. Please refer to the Current Medication list given to you today.  *If you need a refill on your cardiac medications before your next appointment, please call your pharmacy*  Lab Work: None ordered.  You may go to any [facility] Location for your lab work:  [facility] - [address] ([facility]) - [address] [phone] [address]  [place] - [address]   [place]  - [address]   [place] - [address] - [address] [employer]  - [address] [place] - [address] ([facility]   If you have labs (blood work) drawn today and your tests are completely normal, you will receive your results only by: [employer] (if you have MyChart)  If you have any lab test that is abnormal or we need to change your treatment, we will call you or send a MyChart message to review the results.  Testing/Procedures: None ordered.  Follow-Up: At [facility], you and your health needs are our priority.  As part of our continuing mission to provide you with exceptional heart care, we have created designated Provider Care Teams.  These Care Teams include your primary Cardiologist (physician) and Advanced Practice Providers (APPs -  Physician Assistants and Nurse Practitioners) who all work together to provide you with the care you need, when you need it.  We recommend signing up for the patient portal called MyChart.  Sign up information is provided on this After Visit Summary.  MyChart is used to connect with patients for Virtual Visits (Telemedicine).  Patients are able to view lab/test results, encounter notes, upcoming appointments, etc.  Non-urgent messages can be sent to your provider as well.   To learn more about what you can do with MyChart, go to  [email or web address].    Your next appointment:   1 year(s)  The format for your next appointment:   In Person  Provider:   Advanced Practice Providers on your designated Care Team:   Charlies Arthur, PA-C Michael Andy Tillery, PA-C Brandy Ollis, NP  Note: Remote monitoring is used to monitor your Pacemaker/ ICD from home. This monitoring reduces the number of office visits required to check your device to one time per year. It allows us  to keep an eye on the functioning of your device to ensure it is working properly.

## 2023-11-21 ENCOUNTER — Other Ambulatory Visit (HOSPITAL_COMMUNITY)

## 2023-11-28 ENCOUNTER — Other Ambulatory Visit: Payer: Self-pay | Admitting: Family Medicine

## 2023-11-28 DIAGNOSIS — R0609 Other forms of dyspnea: Secondary | ICD-10-CM

## 2023-11-28 DIAGNOSIS — I739 Peripheral vascular disease, unspecified: Secondary | ICD-10-CM

## 2023-11-28 DIAGNOSIS — I6522 Occlusion and stenosis of left carotid artery: Secondary | ICD-10-CM

## 2024-01-02 ENCOUNTER — Ambulatory Visit: Admitting: *Deleted

## 2024-01-02 VITALS — Ht 71.0 in | Wt 187.0 lb

## 2024-01-02 DIAGNOSIS — Z0000 Encounter for general adult medical examination without abnormal findings: Secondary | ICD-10-CM | POA: Diagnosis not present

## 2024-01-02 NOTE — Patient Instructions (Signed)
 [name] , Thank you for taking time to come for your Medicare Wellness Visit. I appreciate your ongoing commitment to your health goals. Please review the following plan we discussed and let me know if I can assist you in the future.   Screening recommendations/referrals: Colonoscopy: up to date Recommended yearly ophthalmology/optometry visit for glaucoma screening and checkup Recommended yearly dental visit for hygiene and checkup  Vaccinations: Influenza vaccine: Education provided Pneumococcal vaccine: up to date Tdap vaccine: Education provided Shingles vaccine: up to date      Preventive Care 65 Years and Older, Male Preventive care refers to lifestyle choices and visits with your health care provider that can promote health and wellness. What does preventive care include? A yearly physical exam. This is also called an annual well check. Dental exams once or twice a year. Routine eye exams. Ask your health care provider how often you should have your eyes checked. Personal lifestyle choices, including: Daily care of your teeth and gums. Regular physical activity. Eating a healthy diet. Avoiding tobacco and drug use. Limiting alcohol use. Practicing safe sex. Taking low doses of aspirin  every day. Taking vitamin and mineral supplements as recommended by your health care provider. What happens during an annual well check? The services and screenings done by your health care provider during your annual well check will depend on your age, overall health, lifestyle risk factors, and family history of disease. Counseling  Your health care provider may ask you questions about your: Alcohol use. Tobacco use. Drug use. Emotional well-being. Home and relationship well-being. Sexual activity. Eating habits. History of falls. Memory and ability to understand (cognition). Work and work Astronomer. Screening  You may have the following tests or measurements: Height, weight,  and BMI. Blood pressure. Lipid and cholesterol levels. These may be checked every 5 years, or more frequently if you are over 28 years old. Skin check. Lung cancer screening. You may have this screening every year starting at age 64 if you have a 30-pack-year history of smoking and currently smoke or have quit within the past 15 years. Fecal occult blood test (FOBT) of the stool. You may have this test every year starting at age 69. Flexible sigmoidoscopy or colonoscopy. You may have a sigmoidoscopy every 5 years or a colonoscopy every 10 years starting at age 25. Prostate cancer screening. Recommendations will vary depending on your family history and other risks. Hepatitis C blood test. Hepatitis B blood test. Sexually transmitted disease (STD) testing. Diabetes screening. This is done by checking your blood sugar (glucose) after you have not eaten for a while (fasting). You may have this done every 1-3 years. Abdominal aortic aneurysm (AAA) screening. You may need this if you are a current or former smoker. Osteoporosis. You may be screened starting at age 47 if you are at high risk. Talk with your health care provider about your test results, treatment options, and if necessary, the need for more tests. Vaccines  Your health care provider may recommend certain vaccines, such as: Influenza vaccine. This is recommended every year. Tetanus, diphtheria, and acellular pertussis (Tdap, Td) vaccine. You may need a Td booster every 10 years. Zoster vaccine. You may need this after age 105. Pneumococcal 13-valent conjugate (PCV13) vaccine. One dose is recommended after age 4. Pneumococcal polysaccharide (PPSV23) vaccine. One dose is recommended after age 70. Talk to your health care provider about which screenings and vaccines you need and how often you need them. This information is not intended to replace  advice given to you by your health care provider. Make sure you discuss any questions you  have with your health care provider. Document Released: 05/06/2015 Document Revised: 12/28/2015 Document Reviewed: 02/08/2015 Elsevier Interactive Patient Education  2017 [employer].  Fall Prevention in the Home Falls can cause injuries. They can happen to people of all ages. There are many things you can do to make your home safe and to help prevent falls. What can I do on the outside of my home? Regularly fix the edges of walkways and driveways and fix any cracks. Remove anything that might make you trip as you walk through a door, such as a raised step or threshold. Trim any bushes or trees on the path to your home. Use bright outdoor lighting. Clear any walking paths of anything that might make someone trip, such as rocks or tools. Regularly check to see if handrails are loose or broken. Make sure that both sides of any steps have handrails. Any raised decks and porches should have guardrails on the edges. Have any leaves, snow, or ice cleared regularly. Use sand or salt on walking paths during winter. Clean up any spills in your garage right away. This includes oil or grease spills. What can I do in the bathroom? Use night lights. Install grab bars by the toilet and in the tub and shower. Do not use towel bars as grab bars. Use non-skid mats or decals in the tub or shower. If you need to sit down in the shower, use a plastic, non-slip stool. Keep the floor dry. Clean up any water that spills on the floor as soon as it happens. Remove soap buildup in the tub or shower regularly. Attach bath mats securely with double-sided non-slip rug tape. Do not have throw rugs and other things on the floor that can make you trip. What can I do in the bedroom? Use night lights. Make sure that you have a light by your bed that is easy to reach. Do not use any sheets or blankets that are too big for your bed. They should not hang down onto the floor. Have a firm chair that has side arms. You can  use this for support while you get dressed. Do not have throw rugs and other things on the floor that can make you trip. What can I do in the kitchen? Clean up any spills right away. Avoid walking on wet floors. Keep items that you use a lot in easy-to-reach places. If you need to reach something above you, use a strong step stool that has a grab bar. Keep electrical cords out of the way. Do not use floor polish or wax that makes floors slippery. If you must use wax, use non-skid floor wax. Do not have throw rugs and other things on the floor that can make you trip. What can I do with my stairs? Do not leave any items on the stairs. Make sure that there are handrails on both sides of the stairs and use them. Fix handrails that are broken or loose. Make sure that handrails are as long as the stairways. Check any carpeting to make sure that it is firmly attached to the stairs. Fix any carpet that is loose or worn. Avoid having throw rugs at the top or bottom of the stairs. If you do have throw rugs, attach them to the floor with carpet tape. Make sure that you have a light switch at the top of the stairs and the bottom  of the stairs. If you do not have them, ask someone to add them for you. What else can I do to help prevent falls? Wear shoes that: Do not have high heels. Have rubber bottoms. Are comfortable and fit you well. Are closed at the toe. Do not wear sandals. If you use a stepladder: Make sure that it is fully opened. Do not climb a closed stepladder. Make sure that both sides of the stepladder are locked into place. Ask someone to hold it for you, if possible. Clearly mark and make sure that you can see: Any grab bars or handrails. First and last steps. Where the edge of each step is. Use tools that help you move around (mobility aids) if they are needed. These include: Canes. Walkers. Scooters. Crutches. Turn on the lights when you go into a dark area. Replace any light  bulbs as soon as they burn out. Set up your furniture so you have a clear path. Avoid moving your furniture around. If any of your floors are uneven, fix them. If there are any pets around you, be aware of where they are. Review your medicines with your doctor. Some medicines can make you feel dizzy. This can increase your chance of falling. Ask your doctor what other things that you can do to help prevent falls. This information is not intended to replace advice given to you by your health care provider. Make sure you discuss any questions you have with your health care provider. Document Released: 02/03/2009 Document Revised: 09/15/2015 Document Reviewed: 05/14/2014 Elsevier Interactive Patient Education  2017 [employer].

## 2024-01-02 NOTE — Progress Notes (Signed)
 Subjective:   Jesus Klein is a 73 y.o. male who presents for Medicare Annual/Subsequent preventive examination.  Visit Complete: Virtual I connected with  Sally Reimers Scholle on 01/02/24 by a audio enabled telemedicine application and verified that I am speaking with the correct person using two identifiers.  Patient Location: Home  Provider Location: Office/Clinic  I discussed the limitations of evaluation and management by telemedicine. The patient expressed understanding and agreed to proceed.  Vital Signs: Because this visit was a virtual/telehealth visit, some criteria may be missing or patient reported. Any vitals not documented were not able to be obtained and vitals that have been documented are patient reported.   Cardiac Risk Factors include: advanced age (>64men, >44 women);hypertension;male gender;obesity (BMI >30kg/m2)     Objective:    Today's Vitals   01/02/24 1028  Weight: 187 lb (84.8 kg)  Height: 5' 11 (1.803 m)   Body mass index is 26.08 kg/m.     01/02/2024   10:38 AM 02/23/2022    9:46 AM 02/23/2021    9:54 AM 03/25/2020    9:34 AM 02/14/2018    8:36 AM 06/29/2016    3:23 PM 04/28/2014    5:00 PM  Advanced Directives  Does Patient Have a Medical Advance Directive? Yes Yes Yes Yes No;Yes  No  No   Type of Sales promotion account executive of Attorney Living will Healthcare Power of [place];Living will Healthcare Power of [place];Living will    Does patient want to make changes to medical advance directive?    No - Patient declined No - Patient declined     Copy of Healthcare Power of Attorney in Chart?  No - copy requested  Yes - validated most recent copy scanned in chart (See row information) No - copy requested     Would patient like information on creating a medical advance directive?   No - Patient declined  No - Patient declined   No - patient declined information      Data saved with a previous flowsheet row definition     Current Medications (verified) Outpatient Encounter Medications as of 01/02/2024  Medication Sig   amLODipine  (NORVASC ) 5 MG tablet TAKE 1 TABLET (5 MG TOTAL) BY MOUTH DAILY.   atorvastatin  (LIPITOR ) 80 MG tablet TAKE 1 TABLET BY MOUTH EVERY DAY   cetirizine (ZYRTEC) 10 MG tablet Take 10 mg by mouth daily as needed.    Cholecalciferol (VITAMIN D ) 125 MCG (5000 UT) CAPS Take 5,000 Units by mouth daily.    clopidogrel  (PLAVIX ) 75 MG tablet TAKE 1 TABLET BY MOUTH EVERY DAY   Coenzyme Q10 300 MG CAPS Take 300 mg by mouth daily.    ezetimibe  (ZETIA ) 10 MG tablet TAKE 1 TABLET BY MOUTH EVERY DAY   fluticasone -salmeterol (ADVAIR DISKUS) 250-50 MCG/ACT AEPB Inhale 1 puff into the lungs in the morning and at bedtime.   Magnesium  400 MG TABS Take 400 mg by mouth daily.   metoprolol  succinate (TOPROL  XL) 25 MG 24 hr tablet Take 1 tablet (25 mg total) by mouth daily.   Multiple Vitamin (MULTIVITAMIN WITH MINERALS) TABS tablet Take 1 tablet by mouth daily.   polyvinyl alcohol (LIQUIFILM TEARS) 1.4 % ophthalmic solution Place 1 drop into both eyes daily as needed for dry eyes (allergies).   No facility-administered encounter medications on file as of 01/02/2024.    Allergies (verified) Patient has no known allergies.   History: Past Medical History:  Diagnosis Date  Allergy    seasonal allergies   Arthritis    arms/shoulders   Blood transfusion without reported diagnosis 2009   with hip replacement   History of tobacco abuse    Hyperlipidemia    on meds   Hypertension 2010   on meds   Left-sided carotid artery disease (HCC)    high-grade ostial left common carotid artery stenosis   Peripheral arterial disease (HCC)    a. 02/2014 - right common iliac and external iliac intervention. b. H/o PTA/stenting of left common carotid artery stenosis. Known 80% left subclavian artery stenosis without UE claudication/steal sx.   Subclavian artery stenosis, left [facility])    Past Surgical History:   Procedure Laterality Date   CAROTID STENT INSERTION N/A 04/28/2014   Procedure: CAROTID STENT INSERTION;  Surgeon: Gaile LELON New, MD;  Location: [facility] CATH LAB;  Service: Cardiovascular;  Laterality: N/A;   ILIAC ARTERY STENT Right 03/04/2014   w/as well as right external iliac and common femoral artery stenting/notes 03/04/2014   JOINT REPLACEMENT     LOWER EXTREMITY ANGIOGRAM N/A 03/04/2014   Procedure: LOWER EXTREMITY ANGIOGRAM;  Surgeon: Dorn JINNY Lesches, MD;  Location: [facility] CATH LAB;  Service: Cardiovascular;  Laterality: N/A;   TOTAL HIP ARTHROPLASTY Right 10/2005   WISDOM TOOTH EXTRACTION     Family History  Problem Relation Age of Onset   Hyperlipidemia Mother    Colitis Mother    Diabetes Mother    Lung cancer Mother 45   Liver cancer Mother 41       mets from lung   Brain cancer Mother 12       mets from lung   Kidney cancer Mother 44       mets from kung   Diverticulitis Mother    Arthritis Father    COPD Father    Diabetes Father    Hypertension Father    Colon cancer Neg Hx    Stomach cancer Neg Hx    Colon polyps Neg Hx    Esophageal cancer Neg Hx    Rectal cancer Neg Hx    Social History   Socioeconomic History   Marital status: Married    Spouse name: Darice   Number of children: 2   Years of education: Not on file   Highest education level: Bachelor's degree (e.g., BA, AB, BS)  Occupational History   Not on file  Tobacco Use   Smoking status: Former    Current packs/day: 0.00    Average packs/day: 1.5 packs/day for 30.0 years (45.0 ttl pk-yrs)    Types: Cigarettes    Start date: 01/20/1979    Quit date: 01/19/2009    Years since quitting: 14.9   Smokeless tobacco: Never  Vaping Use   Vaping status: Never Used  Substance and Sexual Activity   Alcohol use: Yes    Alcohol/week: 2.0 standard drinks of alcohol    Types: 2 Cans of beer per week   Drug use: No   Sexual activity: Yes    Birth control/protection: None  Other Topics Concern   Not on file   Social History Narrative   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 He may occasionally lift 20 pounds but most is done by machine.    Mix of standing, sitting, moving around.      Used to walk a lot and do a lot outside. Got a treadmill to use during cold weather.  Married.   2 daughters- grown.         Social Drivers of Corporate investment banker Strain: Low Risk  (01/02/2024)   Overall Financial Resource Strain (CARDIA)    Difficulty of Paying Living Expenses: Not hard at all  Food Insecurity: No Food Insecurity (01/02/2024)   Hunger Vital Sign    Worried About Running Out of Food in the Last Year: Never true    Ran Out of Food in the Last Year: Never true  Transportation Needs: No Transportation Needs (01/02/2024)   PRAPARE - Administrator, Civil Service (Medical): No    Lack of Transportation (Non-Medical): No  Physical Activity: Sufficiently Active (01/02/2024)   Exercise Vital Sign    Days of Exercise per Week: 5 days    Minutes of Exercise per Session: 50 min  Stress: No Stress Concern Present (01/02/2024)   [employer] of Occupational Health - Occupational Stress Questionnaire    Feeling of Stress: Not at all  Social Connections: Socially Integrated (01/02/2024)   Social Connection and Isolation Panel    Frequency of Communication with Friends and Family: More than three times a week    Frequency of Social Gatherings with Friends and Family: Twice a week    Attends Religious Services: More than 4 times per year    Active Member of [employer] or Organizations: Yes    Attends Engineer, structural: More than 4 times per year    Marital Status: Married  Recent Concern: Social Connections - Moderately Isolated (12/31/2023)   Social Connection and Isolation Panel    Frequency of Communication with Friends and Family: Once a week    Frequency of Social Gatherings with Friends and Family: Once a week     Attends Religious Services: More than 4 times per year    Active Member of [employer] or Organizations: No    Attends Engineer, structural: Not on file    Marital Status: Married    Tobacco Counseling Counseling given: Not Answered   Clinical Intake:  Pre-visit preparation completed: Yes  Pain : No/denies pain     Diabetes: No  How often do you need to have someone help you when you read instructions, pamphlets, or other written materials from your doctor or pharmacy?: 1 - Never  Interpreter Needed?: No  Information entered by :: Mliss Graff LPN   Activities of Daily Living    01/02/2024   10:28 AM  In your present state of health, do you have any difficulty performing the following activities:  Hearing? 1  Vision? 0  Difficulty concentrating or making decisions? 0  Walking or climbing stairs? 0  Dressing or bathing? 0  Doing errands, shopping? 0  Preparing Food and eating ? N  Using the Toilet? N  In the past six months, have you accidently leaked urine? N  Do you have problems with loss of bowel control? N  Managing your Medications? N  Managing your Finances? N  Housekeeping or managing your Housekeeping? N    Patient Care Team: Duanne Butler DASEN, MD as PCP - General (Family Medicine) Court Dorn PARAS, MD as PCP - Cardiology (Cardiology) Mealor, Eulas BRAVO, MD as PCP - Electrophysiology (Cardiology) Nicholaus Sherlean CROME, [facility] (Inactive) as Pharmacist (Pharmacist)  Indicate any recent Medical Services you may have received from other than [facility] providers in the past year (date may be approximate).     Assessment:   This is a routine wellness examination for Jesus Klein.  Hearing/Vision screen Hearing Screening - Comments:: Some trouble hearing Does not wear hearing aids Vision Screening - Comments:: [facility] [place]   Goals Addressed             This Visit's Progress    Activity and Exercise Increased   On track    Continue  exercise/staying healthy     Patient Stated       Maintain healthy weight       Depression Screen    01/02/2024   10:30 AM 02/23/2022    9:34 AM 01/09/2022    3:59 PM 10/06/2021   11:06 AM 04/06/2021   10:22 AM 02/23/2021    9:50 AM 03/25/2020    9:28 AM  PHQ 2/9 Scores  PHQ - 2 Score 0 0 0 0 0 0 0  PHQ- 9 Score 0 0   0      Fall Risk    01/02/2024   10:24 AM 02/23/2022    9:34 AM 10/06/2021   11:06 AM 04/06/2021   10:22 AM 02/23/2021    9:56 AM  Fall Risk   Falls in the past year? 0 0 0 1 1  Number falls in past yr: 0   1 0  Injury with Fall? 0   0 1  Risk for fall due to :     Impaired vision;History of fall(s)  Follow up Falls evaluation completed;Education provided;Falls prevention discussed  Falls evaluation completed   Falls prevention discussed      Data saved with a previous flowsheet row definition    MEDICARE RISK AT HOME: Medicare Risk at Home Any stairs in or around the home?: Yes If so, are there any without handrails?: No Home free of loose throw rugs in walkways, pet beds, electrical cords, etc?: Yes Adequate lighting in your home to reduce risk of falls?: Yes Life alert?: No Use of a cane, walker or w/c?: No Grab bars in the bathroom?: Yes Shower chair or bench in shower?: Yes Elevated toilet seat or a handicapped toilet?: Yes  TIMED UP AND GO:  Was the test performed?  No    Cognitive Function:        01/02/2024   10:27 AM 02/23/2022    9:54 AM 02/23/2021   10:00 AM  6CIT Screen  What Year? 0 points 0 points 0 points  What month? 0 points 0 points 0 points  What time? 0 points 0 points 0 points  Count back from 20 0 points 0 points 0 points  Months in reverse 0 points 0 points 0 points  Repeat phrase 0 points 2 points 0 points  Total Score 0 points 2 points 0 points    Immunizations Immunization History  Administered Date(s) Administered   Fluad Quad(high Dose 65+) 12/23/2018, 02/05/2020   Influenza,inj,Quad PF,6+ Mos 02/02/2014    Influenza-Unspecified 03/03/2018, 01/16/2021, 01/15/2023   PFIZER(Purple Top)SARS-COV-2 Vaccination 06/16/2019, 07/07/2019, 03/01/2020, 01/16/2021   Pfizer(Comirnaty)Fall Seasonal Vaccine 12 years and older 01/15/2023   Pneumococcal Conjugate Pcv21, Polysaccharide Crm197 Conjugaf 04/25/2023   Pneumococcal Conjugate-13 04/23/2016   Pneumococcal Polysaccharide-23 04/23/2017   Respiratory Syncytial Virus Vaccine,Recomb Aduvanted(Arexvy) 01/15/2023   Tdap 09/02/2010   Zoster Recombinant(Shingrix ) 04/25/2023, 06/27/2023   Zoster, Live 11/07/2015    TDAP status: Due, Education has been provided regarding the importance of this vaccine. Advised may receive this vaccine at local pharmacy or Health Dept. Aware to provide a copy of the vaccination record if obtained from local pharmacy or Health Dept. Verbalized acceptance and  understanding.  Flu Vaccine status: Due, Education has been provided regarding the importance of this vaccine. Advised may receive this vaccine at local pharmacy or Health Dept. Aware to provide a copy of the vaccination record if obtained from local pharmacy or Health Dept. Verbalized acceptance and understanding.  Pneumococcal vaccine status: Up to date  Covid-19 vaccine status: Information provided on how to obtain vaccines.   Qualifies for Shingles Vaccine? No   Zostavax completed Yes   Shingrix  Completed?: Yes  Screening Tests Health Maintenance  Topic Date Due   Influenza Vaccine  11/22/2023   COVID-19 Vaccine (6 - Pfizer risk 2024-25 season) 12/23/2023   Lung Cancer Screening  04/18/2024 (Originally 01/20/2002)   DTaP/Tdap/Td (2 - Td or Tdap) 04/18/2024 (Originally 09/01/2020)   Medicare Annual Wellness (AWV)  01/01/2025   Colonoscopy  07/16/2026   Pneumococcal Vaccine: 50+ Years  Completed   Hepatitis C Screening  Completed   Zoster Vaccines- Shingrix   Completed   HPV VACCINES  Aged Out   Meningococcal B Vaccine  Aged Out    Health Maintenance  Health  Maintenance Due  Topic Date Due   Influenza Vaccine  11/22/2023   COVID-19 Vaccine (6 - Pfizer risk 2024-25 season) 12/23/2023    Colorectal cancer screening: Type of screening: Colonoscopy. Completed 2025. Repeat every 5 years  Lung Cancer Screening: (Low Dose CT Chest recommended if Age 79-80 years, 20 pack-year currently smoking OR have quit w/in 15years.) does not qualify.   Lung Cancer Screening Referral:   Additional Screening:  Hepatitis C Screening: does not qualify; Completed 2018  Vision Screening: Recommended annual ophthalmology exams for early detection of glaucoma and other disorders of the eye. Is the patient up to date with their annual eye exam?  Yes  Who is the provider or what is the name of the office in which the patient attends annual eye exams? [facility] If pt is not established with a provider, would they like to be referred to a provider to establish care? No .   Dental Screening: Recommended annual dental exams for proper oral hygiene    Community Resource Referral / Chronic Care Management: CRR required this visit?  No   CCM required this visit?  No     Plan:     I have personally reviewed and noted the following in the patient's chart:   Medical and social history Use of alcohol, tobacco or illicit drugs  Current medications and supplements including opioid prescriptions. Patient is not currently taking opioid prescriptions. Functional ability and status Nutritional status Physical activity Advanced directives List of other physicians Hospitalizations, surgeries, and ER visits in previous 12 months Vitals Screenings to include cognitive, depression, and falls Referrals and appointments  In addition, I have reviewed and discussed with patient certain preventive protocols, quality metrics, and best practice recommendations. A written personalized care plan for preventive services as well as general preventive health recommendations were  provided to patient.     Mliss Graff, LPN   [id number]   After Visit Summary: (MyChart) Due to this being a telephonic visit, the after visit summary with patients personalized plan was offered to patient via MyChart   Nurse Notes:

## 2024-01-27 ENCOUNTER — Ambulatory Visit

## 2024-01-27 DIAGNOSIS — Z23 Encounter for immunization: Secondary | ICD-10-CM | POA: Diagnosis not present

## 2024-01-27 MED ORDER — COVID-19 MRNA VACC (MODERNA) 50 MCG/0.5ML IM SUSY: 0.5000 mL | PREFILLED_SYRINGE | Freq: Once | INTRAMUSCULAR | 0 refills | Status: AC

## 2024-01-27 NOTE — Progress Notes (Signed)
 Patient is in office today for a nurse visit for Immunization. Patient Injection was given in the  Left deltoid. Patient tolerated injection well.

## 2024-01-28 ENCOUNTER — Ambulatory Visit (HOSPITAL_COMMUNITY)
Admission: RE | Admit: 2024-01-28 | Discharge: 2024-01-28 | Disposition: A | Source: Ambulatory Visit | Attending: Cardiovascular Disease | Admitting: Cardiovascular Disease

## 2024-01-28 ENCOUNTER — Ambulatory Visit (HOSPITAL_COMMUNITY)
Admission: RE | Admit: 2024-01-28 | Discharge: 2024-01-28 | Disposition: A | Discharge status: Home / Self Care | Source: Ambulatory Visit | Attending: Cardiovascular Disease | Admitting: Cardiovascular Disease

## 2024-01-28 DIAGNOSIS — Z95828 Presence of other vascular implants and grafts: Secondary | ICD-10-CM

## 2024-01-28 LAB — VAS US ABI WITH/WO TBI
Left ABI: 0.55
Right ABI: 0.86

## 2024-02-24 ENCOUNTER — Telehealth: Payer: Self-pay

## 2024-02-24 ENCOUNTER — Other Ambulatory Visit: Payer: Self-pay

## 2024-02-24 DIAGNOSIS — I739 Peripheral vascular disease, unspecified: Secondary | ICD-10-CM

## 2024-02-24 DIAGNOSIS — R0609 Other forms of dyspnea: Secondary | ICD-10-CM

## 2024-02-24 DIAGNOSIS — I6522 Occlusion and stenosis of left carotid artery: Secondary | ICD-10-CM

## 2024-02-24 MED ORDER — CLOPIDOGREL BISULFATE 75 MG PO TABS: 75.0000 mg | ORAL_TABLET | Freq: Every day | ORAL | 1 refills | Status: AC

## 2024-02-24 NOTE — Telephone Encounter (Signed)
 Prescription Request  02/24/2024  LOV: 04/25/23  What is the name of the medication or equipment? clopidogrel  (PLAVIX ) 75 MG tablet [[id number]]   Have you contacted your pharmacy to request a refill? Yes   Which pharmacy would you like this sent to?  [id number] [place] [address] [place] [address] Phone: [phone] Fax: [phone]    Patient notified that their request is being sent to the clinical staff for review and that they should receive a response within 2 business days.   Please advise at [facility] [phone]

## 2024-03-27 ENCOUNTER — Telehealth: Payer: Self-pay

## 2024-03-27 ENCOUNTER — Other Ambulatory Visit: Payer: Self-pay | Admitting: Family Medicine

## 2024-03-27 MED ORDER — TRELEGY ELLIPTA 200-62.5-25 MCG/ACT IN AEPB: 1.0000 | INHALATION_SPRAY | Freq: Every day | RESPIRATORY_TRACT | 11 refills | Status: DC

## 2024-03-27 NOTE — Telephone Encounter (Signed)
 Copied from CRM #[id number]. Topic: Clinical - Prescription Issue >> Mar 27, 2024 11:15 AM Charlet HERO wrote: Reason for CRM: The patient is calling to have the med switched back to the trilogy bc the pricing has come back down. Please call the patient back today. Would like for Dr. Breck nurse to call him back.

## 2024-04-01 ENCOUNTER — Telehealth: Payer: Self-pay

## 2024-04-01 NOTE — Telephone Encounter (Signed)
 Prescription Request  04/01/2024  LOV: 04/25/23  What is the name of the medication or equipment? amLODipine  (NORVASC ) 5 MG tablet [[id number]]   Have you contacted your pharmacy to request a refill? Yes   Which pharmacy would you like this sent to?  [id number] [place] [address] [place] [address] Phone: [phone] Fax: [phone]    Patient notified that their request is being sent to the clinical staff for review and that they should receive a response within 2 business days.   Please advise at [facility] [phone]  Prescription Request  04/01/2024  LOV: 04/25/23  What is the name of the medication or equipment? ezetimibe  (ZETIA ) 10 MG tablet [[id number]]   Have you contacted your pharmacy to request a refill? Yes   Which pharmacy would you like this sent to?  [id number] [place] [address] [place] [address] Phone: [phone] Fax: [phone]    Patient notified that their request is being sent to the clinical staff for review and that they should receive a response within 2 business days.   Please advise at [facility] [phone]

## 2024-04-02 ENCOUNTER — Other Ambulatory Visit: Payer: Self-pay

## 2024-04-02 MED ORDER — AMLODIPINE BESYLATE 5 MG PO TABS: 5.0000 mg | ORAL_TABLET | Freq: Every day | ORAL | 1 refills | Status: AC

## 2024-04-02 MED ORDER — EZETIMIBE 10 MG PO TABS: 10.0000 mg | ORAL_TABLET | Freq: Every day | ORAL | 2 refills | Status: AC

## 2024-04-02 NOTE — Telephone Encounter (Signed)
 Sent in medication

## 2024-04-30 ENCOUNTER — Encounter: Payer: Self-pay | Admitting: Family Medicine

## 2024-04-30 ENCOUNTER — Ambulatory Visit: Admitting: Family Medicine

## 2024-04-30 VITALS — BP 130/70 | HR 67 | Temp 97.5°F | Ht 71.0 in | Wt 180.6 lb

## 2024-04-30 DIAGNOSIS — I739 Peripheral vascular disease, unspecified: Secondary | ICD-10-CM | POA: Diagnosis not present

## 2024-04-30 DIAGNOSIS — I2510 Atherosclerotic heart disease of native coronary artery without angina pectoris: Secondary | ICD-10-CM | POA: Diagnosis not present

## 2024-04-30 DIAGNOSIS — I5022 Chronic systolic (congestive) heart failure: Secondary | ICD-10-CM | POA: Diagnosis not present

## 2024-04-30 DIAGNOSIS — R918 Other nonspecific abnormal finding of lung field: Secondary | ICD-10-CM | POA: Diagnosis not present

## 2024-04-30 DIAGNOSIS — Z125 Encounter for screening for malignant neoplasm of prostate: Secondary | ICD-10-CM

## 2024-04-30 DIAGNOSIS — Z0001 Encounter for general adult medical examination with abnormal findings: Secondary | ICD-10-CM

## 2024-04-30 DIAGNOSIS — I10 Essential (primary) hypertension: Secondary | ICD-10-CM | POA: Diagnosis not present

## 2024-04-30 DIAGNOSIS — E782 Mixed hyperlipidemia: Secondary | ICD-10-CM | POA: Diagnosis not present

## 2024-04-30 DIAGNOSIS — Z0000 Encounter for general adult medical examination without abnormal findings: Secondary | ICD-10-CM

## 2024-04-30 MED ORDER — LOSARTAN POTASSIUM 50 MG PO TABS: 50.0000 mg | ORAL_TABLET | Freq: Every day | ORAL | 3 refills | Status: AC

## 2024-04-30 NOTE — Progress Notes (Signed)
 "  Subjective:    Patient ID: Jesus Klein, male    DOB: 1951-01-15, 74 y.o.   MRN: [id number]  Patient is a very pleasant 74 year old Caucasian gentleman who has a history of left-sided carotid artery disease and right-sided carotid artery disease.  However he had carotid Dopplers performed in 2024 that showed 1 to 39% internal carotid artery stenosis bilaterally.  He has known peripheral artery disease.  Please see the past medical history.  This included high-grade ostial left common carotid artery stenosis that required a stent.  He also had right common iliac and external iliac stenting.  He is currently on Plavix .  Last year, the patient was found to have a significantly elevated coronary artery calcium  score greater than 2000.  The patient had a PET scan of the heart which showed an ejection fraction of around 34% but no evidence of ischemia or infarction.  He is currently on a beta-blocker along with amlodipine  he is not taking any type of ACE inhibitor or ARB or spironolactone or Jardiance.  He denies any shortness of breath or dyspnea on exertion or chest pain.  I reviewed the CT scan.  It also makes mention of several pulmonary nodules.  This was seen in April 2025.  He had a follow-up CT scan with the PET scan in June 2025 that showed no significant change.  He is now 7 months out.  They recommended a repeat CT scan in 1 year to monitor these nodules.  He will be coming due for that this summer.  Patient had a colonoscopy in 2025 that showed a tubular adenoma.  They recommended a repeat colonoscopy in 5 years.  He is due for a PSA.  His immunizations are up-to-date except for the tetanus shot which he declines today.  He has a history of smoking but he has quit smoking Immunization History  Administered Date(s) Administered   Fluad Quad(high Dose 65+) 12/23/2018, 02/05/2020   INFLUENZA, HIGH DOSE SEASONAL PF 01/27/2024   Influenza,inj,Quad PF,6+ Mos 02/02/2014   Influenza-Unspecified 03/03/2018,  01/16/2021, 01/15/2023   PFIZER(Purple Top)SARS-COV-2 Vaccination 06/16/2019, 07/07/2019, 03/01/2020, 01/16/2021   Pfizer(Comirnaty)Fall Seasonal Vaccine 12 years and older 01/15/2023   Pneumococcal Conjugate Pcv21, Polysaccharide Crm197 Conjugaf 04/25/2023   Pneumococcal Conjugate-13 04/23/2016   Pneumococcal Polysaccharide-23 04/23/2017   Respiratory Syncytial Virus Vaccine,Recomb Aduvanted(Arexvy) 01/15/2023   Tdap 09/02/2010   Zoster Recombinant(Shingrix ) 04/25/2023, 06/27/2023   Zoster, Live 11/07/2015    Past Medical History:  Diagnosis Date   Allergy    seasonal allergies   Arthritis    arms/shoulders   Blood transfusion without reported diagnosis 2009   with hip replacement   History of tobacco abuse    Hyperlipidemia    on meds   Hypertension 2010   on meds   Left-sided carotid artery disease    high-grade ostial left common carotid artery stenosis   Peripheral arterial disease    a. 02/2014 - right common iliac and external iliac intervention. b. H/o PTA/stenting of left common carotid artery stenosis. Known 80% left subclavian artery stenosis without UE claudication/steal sx.   Subclavian artery stenosis, left    Past Surgical History:  Procedure Laterality Date   CAROTID STENT INSERTION N/A 04/28/2014   Procedure: CAROTID STENT INSERTION;  Surgeon: Gaile LELON New, MD;  Location: [facility] CATH LAB;  Service: Cardiovascular;  Laterality: N/A;   ILIAC ARTERY STENT Right 03/04/2014   w/as well as right external iliac and common femoral artery stenting/notes 03/04/2014   JOINT REPLACEMENT  LOWER EXTREMITY ANGIOGRAM N/A 03/04/2014   Procedure: LOWER EXTREMITY ANGIOGRAM;  Surgeon: Dorn JINNY Lesches, MD;  Location: [facility] CATH LAB;  Service: Cardiovascular;  Laterality: N/A;   TOTAL HIP ARTHROPLASTY Right 10/2005   WISDOM TOOTH EXTRACTION     Current Outpatient Medications on File Prior to Visit  Medication Sig Dispense Refill   amLODipine  (NORVASC ) 5 MG tablet Take 1 tablet (5  mg total) by mouth daily. 90 tablet 1   atorvastatin  (LIPITOR ) 80 MG tablet TAKE 1 TABLET BY MOUTH EVERY DAY 90 tablet 2   cetirizine (ZYRTEC) 10 MG tablet Take 10 mg by mouth daily as needed.      Cholecalciferol (VITAMIN D ) 125 MCG (5000 UT) CAPS Take 5,000 Units by mouth daily.      clopidogrel  (PLAVIX ) 75 MG tablet Take 1 tablet (75 mg total) by mouth daily. PT NEEDS APPT BEFORE FURTHER REFILLS. 30 tablet 1   Coenzyme Q10 300 MG CAPS Take 300 mg by mouth daily.      ezetimibe  (ZETIA ) 10 MG tablet Take 1 tablet (10 mg total) by mouth daily. 90 tablet 2   Fluticasone -Umeclidin-Vilant (TRELEGY ELLIPTA ) 200-62.5-25 MCG/ACT AEPB Inhale 1 Inhalation into the lungs daily. 1 each 11   Magnesium  400 MG TABS Take 400 mg by mouth daily. 90 tablet 3   metoprolol  succinate (TOPROL  XL) 25 MG 24 hr tablet Take 1 tablet (25 mg total) by mouth daily. 90 tablet 3   Multiple Vitamin (MULTIVITAMIN WITH MINERALS) TABS tablet Take 1 tablet by mouth daily.     polyvinyl alcohol (LIQUIFILM TEARS) 1.4 % ophthalmic solution Place 1 drop into both eyes daily as needed for dry eyes (allergies).     No current facility-administered medications on file prior to visit.   No Known Allergies Social History   Socioeconomic History   Marital status: Married    Spouse name: Darice   Number of children: 2   Years of education: Not on file   Highest education level: Bachelor's degree (e.g., BA, AB, BS)  Occupational History   Not on file  Tobacco Use   Smoking status: Former    Current packs/day: 0.00    Average packs/day: 1.5 packs/day for 30.0 years (45.0 ttl pk-yrs)    Types: Cigarettes    Start date: 01/20/1979    Quit date: 01/19/2009    Years since quitting: 15.2   Smokeless tobacco: Never  Vaping Use   Vaping status: Never Used  Substance and Sexual Activity   Alcohol use: Yes    Alcohol/week: 2.0 standard drinks of alcohol    Types: 2 Cans of beer per week   Drug use: No   Sexual activity: Yes    Birth  control/protection: None  Other Topics Concern   Not on file  Social History Narrative   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 He may occasionally lift 20 pounds but most is done by machine.    Mix of standing, sitting, moving around.      Used to walk a lot and do a lot outside. Got a treadmill to use during cold weather.   Married.   2 daughters- grown.         Social Drivers of Health   Tobacco Use: Medium Risk (04/30/2024)   Patient History    Smoking Tobacco Use: Former    Smokeless Tobacco Use: Never    Passive Exposure: Not on Actuary Strain: Low  Risk (04/27/2024)   Overall Financial Resource Strain (CARDIA)    Difficulty of Paying Living Expenses: Not hard at all  Food Insecurity: No Food Insecurity (04/27/2024)   Epic    Worried About Programme Researcher, Broadcasting/film/video in the Last Year: Never true    Ran Out of Food in the Last Year: Never true  Transportation Needs: No Transportation Needs (04/27/2024)   Epic    Lack of Transportation (Medical): No    Lack of Transportation (Non-Medical): No  Physical Activity: Sufficiently Active (04/27/2024)   Exercise Vital Sign    Days of Exercise per Week: 6 days    Minutes of Exercise per Session: 30 min  Stress: No Stress Concern Present (04/27/2024)   [employer] of Occupational Health - Occupational Stress Questionnaire    Feeling of Stress: Not at all  Social Connections: Moderately Isolated (04/27/2024)   Social Connection and Isolation Panel    Frequency of Communication with Friends and Family: Once a week    Frequency of Social Gatherings with Friends and Family: Once a week    Attends Religious Services: More than 4 times per year    Active Member of [employer] or Organizations: No    Attends Banker Meetings: Not on file    Marital Status: Married  Intimate Partner Violence: Not At Risk (01/02/2024)   Epic    Fear of Current or Ex-Partner: No     Emotionally Abused: No    Physically Abused: No    Sexually Abused: No  Depression (PHQ2-9): Low Risk (01/02/2024)   Depression (PHQ2-9)    PHQ-2 Score: 0  Alcohol Screen: Low Risk (04/27/2024)   Alcohol Screen    Last Alcohol Screening Score (AUDIT): 1  Housing: Unknown (04/27/2024)   Epic    Unable to Pay for Housing in the Last Year: No    Number of Times Moved in the Last Year: Not on file    Homeless in the Last Year: No  Utilities: Not At Risk (02/23/2022)   [facility] Utilities    Threatened with loss of utilities: No  Health Literacy: Adequate Health Literacy (01/02/2024)   B1300 Health Literacy    Frequency of need for help with medical instructions: Never     Review of Systems  All other systems reviewed and are negative.      Objective:   Physical Exam Vitals reviewed.  Constitutional:      General: He is not in acute distress.    Appearance: Normal appearance. He is normal weight. He is not ill-appearing, toxic-appearing or diaphoretic.  Neck:     Vascular: Carotid bruit present.  Cardiovascular:     Rate and Rhythm: Normal rate and regular rhythm.     Pulses: Normal pulses.     Heart sounds: Normal heart sounds.     No friction rub. No gallop.  Pulmonary:     Effort: Pulmonary effort is normal. No respiratory distress.     Breath sounds: No stridor. Wheezing present. No rhonchi or rales.  Chest:     Chest wall: No tenderness.  Abdominal:     General: Abdomen is flat. Bowel sounds are normal. There is no distension.     Palpations: Abdomen is soft. There is no mass.     Tenderness: There is no abdominal tenderness. There is no right CVA tenderness, left CVA tenderness, guarding or rebound.     Hernia: No hernia is present.  Musculoskeletal:     Cervical back: Normal range of motion  and neck supple. No rigidity or tenderness.     Right lower leg: No edema.     Left lower leg: No edema.  Lymphadenopathy:     Cervical: No cervical adenopathy.  Skin:    Findings:  No lesion or rash.  Neurological:     General: No focal deficit present.     Mental Status: He is alert and oriented to person, place, and time. Mental status is at baseline.     Cranial Nerves: No cranial nerve deficit.     Motor: No weakness.     Gait: Gait normal.  Psychiatric:        Mood and Affect: Mood normal.        Behavior: Behavior normal.        Thought Content: Thought content normal.        Judgment: Judgment normal.           Assessment & Plan:   Pulmonary nodules - Plan: CT Chest Wo Contrast  PAD (peripheral artery disease) - Plan: CBC with Differential/Platelet, Comprehensive metabolic panel with GFR, Lipid panel  Essential hypertension  Lung nodules  General medical exam  Mixed hyperlipidemia  Prostate cancer screening - Plan: PSA  Coronary artery disease involving native coronary artery of native heart without angina pectoris  Chronic heart failure with reduced ejection fraction (HFrEF, <= 40%) (HCC) First, regarding his physical, he is due for a tetanus shot.  The remainder of his vaccinations are up-to-date.  He declines the tetanus shot today.  Second, with regards to his physical, he is due for prostate cancer screening with a PSA.  He is also due to repeat a CT scan of the lung to monitor his lung nodules in June.  I ordered a PSA today as well as a CT scan of the lung.  Third, he has a history of coronary artery disease as well as peripheral artery disease.  His goal LDL cholesterol is less than 70.  I would like to check a fasting lipid panel today.  His blood pressure today is well-controlled.  He is on a beta-blocker.  He is not on an ACE or an ARB.  Therefore I recommended stopping amlodipine  and replacing with losartan  50 mg a day.  Patient sees his cardiologist later this spring so I will defer any additional changes with regards to heart failure to his cardiologist. "

## 2024-05-01 ENCOUNTER — Telehealth: Payer: Self-pay

## 2024-05-01 ENCOUNTER — Ambulatory Visit: Payer: Self-pay | Admitting: Family Medicine

## 2024-05-01 ENCOUNTER — Other Ambulatory Visit: Payer: Self-pay | Admitting: Family Medicine

## 2024-05-01 LAB — COMPREHENSIVE METABOLIC PANEL WITH GFR
AG Ratio: 2.3 (calc) (ref 1.0–2.5)
ALT: 30 U/L (ref 9–46)
AST: 29 U/L (ref 10–35)
Albumin: 4.8 g/dL (ref 3.6–5.1)
Alkaline phosphatase (APISO): 62 U/L (ref 35–144)
BUN: 18 mg/dL (ref 7–25)
CO2: 27 mmol/L (ref 20–32)
Calcium: 9.6 mg/dL (ref 8.6–10.3)
Chloride: 104 mmol/L (ref 98–110)
Creat: 0.91 mg/dL (ref 0.70–1.28)
Globulin: 2.1 g/dL (ref 1.9–3.7)
Glucose, Bld: 97 mg/dL (ref 65–99)
Potassium: 4.5 mmol/L (ref 3.5–5.3)
Sodium: 139 mmol/L (ref 135–146)
Total Bilirubin: 0.9 mg/dL (ref 0.2–1.2)
Total Protein: 6.9 g/dL (ref 6.1–8.1)
eGFR: 90 mL/min/1.73m2

## 2024-05-01 LAB — CBC WITH DIFFERENTIAL/PLATELET
Absolute Lymphocytes: 1992 {cells}/uL (ref 850–3900)
Absolute Monocytes: 690 {cells}/uL (ref 200–950)
Basophils Absolute: 18 {cells}/uL (ref 0–200)
Basophils Relative: 0.3 %
Eosinophils Absolute: 120 {cells}/uL (ref 15–500)
Eosinophils Relative: 2 %
HCT: 51.4 % — ABNORMAL HIGH (ref 39.4–51.1)
Hemoglobin: 16.9 g/dL (ref 13.2–17.1)
MCH: 30.4 pg (ref 27.0–33.0)
MCHC: 32.9 g/dL (ref 31.6–35.4)
MCV: 92.4 fL (ref 81.4–101.7)
MPV: 10.2 fL (ref 7.5–12.5)
Monocytes Relative: 11.5 %
Neutro Abs: 3180 {cells}/uL (ref 1500–7800)
Neutrophils Relative %: 53 %
Platelets: 247 Thousand/uL (ref 140–400)
RBC: 5.56 Million/uL (ref 4.20–5.80)
RDW: 12.2 % (ref 11.0–15.0)
Total Lymphocyte: 33.2 %
WBC: 6 Thousand/uL (ref 3.8–10.8)

## 2024-05-01 LAB — LIPID PANEL
Cholesterol: 121 mg/dL
HDL: 50 mg/dL
LDL Cholesterol (Calc): 54 mg/dL
Non-HDL Cholesterol (Calc): 71 mg/dL
Total CHOL/HDL Ratio: 2.4 (calc)
Triglycerides: 89 mg/dL

## 2024-05-01 LAB — PSA: PSA: 2.06 ng/mL

## 2024-05-01 MED ORDER — FLUTICASONE-SALMETEROL 250-50 MCG/ACT IN AEPB: 1.0000 | INHALATION_SPRAY | Freq: Two times a day (BID) | RESPIRATORY_TRACT | 11 refills | Status: AC

## 2024-05-01 NOTE — Telephone Encounter (Signed)
 Copied from CRM #[id number]. Topic: Clinical - Medication Question >> May 01, 2024 10:22 AM Rosaria BRAVO wrote: Reason for CRM: Pt called requesting to speak to a nurse regarding  I want to cancel the trelegy ellipta  and reinstate the wixela inhub  Best contact: [phone]

## 2024-05-22 ENCOUNTER — Telehealth: Payer: Self-pay

## 2024-05-22 ENCOUNTER — Other Ambulatory Visit: Payer: Self-pay

## 2024-05-22 DIAGNOSIS — E782 Mixed hyperlipidemia: Secondary | ICD-10-CM

## 2024-05-22 MED ORDER — ATORVASTATIN CALCIUM 80 MG PO TABS: 80.0000 mg | ORAL_TABLET | Freq: Every day | ORAL | 2 refills | Status: AC

## 2024-05-22 NOTE — Telephone Encounter (Signed)
 Prescription Request  05/22/2024  LOV: 04/28/24  What is the name of the medication or equipment? atorvastatin  (LIPITOR ) 80 MG tablet [[id number]]   Have you contacted your pharmacy to request a refill? Yes   Which pharmacy would you like this sent to?  [id number] [place] [address] [place] [address] Phone: [phone] Fax: [phone]    Patient notified that their request is being sent to the clinical staff for review and that they should receive a response within 2 business days.   Please advise at [facility] [phone]

## 2024-09-28 ENCOUNTER — Ambulatory Visit

## 2025-01-07 ENCOUNTER — Encounter
